# Patient Record
Sex: Female | Born: 1960 | Race: White | Hispanic: No | Marital: Married | Smoking: Never smoker
Health system: Southern US, Community
[De-identification: ages and names within clinical notes are randomized; demographics above are authoritative.]

## PROBLEM LIST (undated history)

## (undated) DIAGNOSIS — K219 Gastro-esophageal reflux disease without esophagitis: Secondary | ICD-10-CM

## (undated) DIAGNOSIS — Z9221 Personal history of antineoplastic chemotherapy: Secondary | ICD-10-CM

## (undated) DIAGNOSIS — I10 Essential (primary) hypertension: Secondary | ICD-10-CM

## (undated) DIAGNOSIS — I251 Atherosclerotic heart disease 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: Secondary | ICD-10-CM

## (undated) HISTORY — PX: FOOT SURGERY: SHX648

## (undated) HISTORY — DX: Essential (primary) hypertension: I10

---

## 2002-08-12 ENCOUNTER — Other Ambulatory Visit: Admission: RE | Admit: 2002-08-12 | Discharge: 2002-08-12 | Payer: Self-pay | Admitting: Obstetrics & Gynecology

## 2003-08-15 ENCOUNTER — Other Ambulatory Visit: Admission: RE | Admit: 2003-08-15 | Discharge: 2003-08-15 | Payer: Self-pay | Admitting: Obstetrics & Gynecology

## 2004-10-22 ENCOUNTER — Other Ambulatory Visit: Admission: RE | Admit: 2004-10-22 | Discharge: 2004-10-22 | Payer: Self-pay | Admitting: Obstetrics & Gynecology

## 2008-01-07 ENCOUNTER — Encounter: Admission: RE | Admit: 2008-01-07 | Discharge: 2008-01-07 | Payer: Self-pay | Admitting: Family Medicine

## 2010-02-24 ENCOUNTER — Encounter: Payer: Self-pay | Admitting: Internal Medicine

## 2011-06-10 ENCOUNTER — Ambulatory Visit: Payer: PRIVATE HEALTH INSURANCE | Attending: Orthopedic Surgery | Admitting: Physical Therapy

## 2011-06-10 DIAGNOSIS — M25676 Stiffness of unspecified foot, not elsewhere classified: Secondary | ICD-10-CM | POA: Insufficient documentation

## 2011-06-10 DIAGNOSIS — IMO0001 Reserved for inherently not codable concepts without codable children: Secondary | ICD-10-CM | POA: Insufficient documentation

## 2011-06-10 DIAGNOSIS — M25673 Stiffness of unspecified ankle, not elsewhere classified: Secondary | ICD-10-CM | POA: Insufficient documentation

## 2011-06-10 DIAGNOSIS — R262 Difficulty in walking, not elsewhere classified: Secondary | ICD-10-CM | POA: Insufficient documentation

## 2011-06-10 DIAGNOSIS — M25579 Pain in unspecified ankle and joints of unspecified foot: Secondary | ICD-10-CM | POA: Insufficient documentation

## 2011-06-13 ENCOUNTER — Ambulatory Visit: Payer: PRIVATE HEALTH INSURANCE | Admitting: Physical Therapy

## 2011-06-16 ENCOUNTER — Ambulatory Visit: Payer: PRIVATE HEALTH INSURANCE | Admitting: Physical Therapy

## 2011-06-18 ENCOUNTER — Encounter: Payer: Self-pay | Admitting: Physical Therapy

## 2011-06-23 ENCOUNTER — Encounter: Payer: Self-pay | Admitting: Physical Therapy

## 2011-06-25 ENCOUNTER — Encounter: Payer: Self-pay | Admitting: Physical Therapy

## 2011-07-01 ENCOUNTER — Encounter: Payer: Self-pay | Admitting: Physical Therapy

## 2011-07-03 ENCOUNTER — Encounter: Payer: Self-pay | Admitting: Physical Therapy

## 2016-06-27 ENCOUNTER — Other Ambulatory Visit: Payer: Self-pay | Admitting: Otolaryngology

## 2016-06-27 DIAGNOSIS — H9042 Sensorineural hearing loss, unilateral, left ear, with unrestricted hearing on the contralateral side: Secondary | ICD-10-CM

## 2016-07-08 ENCOUNTER — Ambulatory Visit (HOSPITAL_COMMUNITY): Payer: PRIVATE HEALTH INSURANCE | Attending: Cardiovascular Disease

## 2016-07-08 ENCOUNTER — Other Ambulatory Visit: Payer: Self-pay

## 2016-07-08 ENCOUNTER — Encounter (INDEPENDENT_AMBULATORY_CARE_PROVIDER_SITE_OTHER): Payer: Self-pay

## 2016-07-08 DIAGNOSIS — H9042 Sensorineural hearing loss, unilateral, left ear, with unrestricted hearing on the contralateral side: Secondary | ICD-10-CM | POA: Diagnosis not present

## 2016-07-08 LAB — ECHOCARDIOGRAM COMPLETE BUBBLE STUDY
AVLVOTPG: 5 mmHg
Ao-asc: 32 cm
CHL CUP TV REG PEAK VELOCITY: 255 cm/s
EERAT: 8.64
EWDT: 254 ms
FS: 43 % (ref 28–44)
IVS/LV PW RATIO, ED: 1.11
LA ID, A-P, ES: 36 mm
LA vol index: 29.8 mL/m2
LA vol: 52 mL
LADIAMINDEX: 2.07 cm/m2
LAVOLA4C: 52 mL
LEFT ATRIUM END SYS DIAM: 36 mm
LV E/e' medial: 8.64
LV SIMPSON'S DISK: 66
LV TDI E'LATERAL: 8.97
LV TDI E'MEDIAL: 6.24
LV dias vol index: 44 mL/m2
LV dias vol: 77 mL (ref 46–106)
LV e' LATERAL: 8.97 cm/s
LV sys vol index: 15 mL/m2
LVEEAVG: 8.64
LVOT SV: 72 mL
LVOT VTI: 28.5 cm
LVOT area: 2.54 cm2
LVOT diameter: 18 mm
LVOT peak vel: 115 cm/s
LVSYSVOL: 26 mL (ref 14–42)
MV Dec: 254
MV Peak grad: 2 mmHg
MVPKAVEL: 97.2 m/s
MVPKEVEL: 77.5 m/s
PW: 10 mm — AB (ref 0.6–1.1)
RV LATERAL S' VELOCITY: 15.7 cm/s
RV sys press: 29 mmHg
Stroke v: 51 ml
TRMAXVEL: 255 cm/s

## 2017-10-26 DIAGNOSIS — M25561 Pain in right knee: Secondary | ICD-10-CM | POA: Insufficient documentation

## 2017-10-29 ENCOUNTER — Other Ambulatory Visit: Payer: Self-pay | Admitting: Orthopedic Surgery

## 2017-10-29 DIAGNOSIS — M25561 Pain in right knee: Secondary | ICD-10-CM

## 2017-10-29 DIAGNOSIS — R609 Edema, unspecified: Secondary | ICD-10-CM

## 2017-10-30 ENCOUNTER — Ambulatory Visit
Admission: RE | Admit: 2017-10-30 | Discharge: 2017-10-30 | Disposition: A | Discharge status: Home / Self Care | Payer: PRIVATE HEALTH INSURANCE | Source: Ambulatory Visit | Attending: Orthopedic Surgery | Admitting: Orthopedic Surgery

## 2017-10-30 DIAGNOSIS — M25561 Pain in right knee: Secondary | ICD-10-CM

## 2017-10-30 DIAGNOSIS — R609 Edema, unspecified: Secondary | ICD-10-CM

## 2017-11-09 ENCOUNTER — Ambulatory Visit
Admission: RE | Admit: 2017-11-09 | Discharge: 2017-11-09 | Disposition: A | Discharge status: Home / Self Care | Payer: PRIVATE HEALTH INSURANCE | Source: Ambulatory Visit | Attending: Internal Medicine | Admitting: Internal Medicine

## 2017-11-09 ENCOUNTER — Other Ambulatory Visit: Payer: Self-pay | Admitting: Internal Medicine

## 2017-11-09 DIAGNOSIS — R059 Cough, unspecified: Secondary | ICD-10-CM

## 2017-11-09 DIAGNOSIS — R05 Cough: Secondary | ICD-10-CM

## 2017-12-03 ENCOUNTER — Other Ambulatory Visit: Payer: Self-pay | Admitting: Internal Medicine

## 2017-12-03 DIAGNOSIS — Z1239 Encounter for other screening for malignant neoplasm of breast: Secondary | ICD-10-CM

## 2019-01-26 DIAGNOSIS — R030 Elevated blood-pressure reading, without diagnosis of hypertension: Secondary | ICD-10-CM | POA: Insufficient documentation

## 2019-06-06 ENCOUNTER — Ambulatory Visit: Payer: PRIVATE HEALTH INSURANCE | Admitting: Internal Medicine

## 2019-06-07 ENCOUNTER — Other Ambulatory Visit: Payer: Self-pay

## 2019-06-07 ENCOUNTER — Ambulatory Visit (INDEPENDENT_AMBULATORY_CARE_PROVIDER_SITE_OTHER): Payer: PRIVATE HEALTH INSURANCE | Admitting: Pulmonary Disease

## 2019-06-07 ENCOUNTER — Encounter: Payer: Self-pay | Admitting: Pulmonary Disease

## 2019-06-07 DIAGNOSIS — R05 Cough: Secondary | ICD-10-CM

## 2019-06-07 DIAGNOSIS — R059 Cough, unspecified: Secondary | ICD-10-CM

## 2019-06-07 MED ORDER — OMEPRAZOLE 40 MG PO CPDR: 40.0000 mg | DELAYED_RELEASE_CAPSULE | Freq: Every day | ORAL | 5 refills | Status: DC

## 2019-06-07 MED ORDER — CHLORPHENIRAMINE MALEATE 4 MG PO TABS: 8.0000 mg | ORAL_TABLET | Freq: Three times a day (TID) | ORAL | 1 refills | Status: DC | PRN

## 2019-06-07 MED ORDER — FLUTICASONE PROPIONATE 50 MCG/ACT NA SUSP: 2.0000 | Freq: Every day | NASAL | 2 refills | Status: DC

## 2019-06-07 NOTE — Patient Instructions (Addendum)
We will check labs today including CBC differential, IgE Schedule high-resolution CT, pulmonary function test Referred to ENT for persistent cough, throat discomfort  For postnasal drip will try Flonase, chlorpheniramine 8 mg 3 times daily For acid reflux start omeprazole 40 mg twice daily for 2 weeks next Follow-up in 1 to 2 months.

## 2019-06-07 NOTE — Progress Notes (Signed)
Christine [name]    [id number]    04-28-1960  Primary Care Physician:Gates, Herbie [place], MD  Referring Physician: [name], MD [address] [place],  [place] [address]  Chief complaint: Consult for chronic cough  HPI: 59 year old with hypertension. Complains of chronic cough for the past 1.5 years.  Symptoms associated with throat tightening, discomfort.  Denies any wheezing, chest tightness, chest congestion. She has been treated comprehensively by her primary care with Allegra, Claritin, Zyrtec.  She also used Prilosec over-the-counter for acid reflux and a course of prednisone in spring 2020 without improvement in symptoms She has followed up with allergy with negative skin testing.  Taken off losartan for 1 month and given gabapentin for 2 months all without any significant effect on cough.   Pets: Has outdoor cats, chickens outside.  She cleans the group occasionally and wears a mask while doing so. Occupation: Works at her Economist for [employer] Exposures: No known exposures.  No mold, hot tub, Jacuzzi.  No down pillows or comforters Smoking history: Never smoker Travel history: Originally from [place].  No significant recent travel Relevant family history: No significant family history of lung disease   Outpatient Encounter Medications as of 06/07/2019  Medication Sig  . Bacillus Coagulans-Inulin (PROBIOTIC) 1-250 BILLION-MG CAPS Take by mouth daily.   . Magnesium 100 MG CAPS Take by mouth daily.   . valsartan-hydrochlorothiazide (DIOVAN-HCT) 80-12.5 MG tablet Take 0.5 tablets by mouth daily.   No facility-administered encounter medications on file as of 06/07/2019.    Allergies as of 06/07/2019  . (No Known Allergies)    Past Medical History:  Diagnosis Date  . Hypertension     Past Surgical History:  Procedure Laterality Date  . CESAREAN SECTION    . FOOT SURGERY Right     Family History  Problem Relation Age of Onset   . Heart disease Father   . Breast cancer Sister     Social History   Socioeconomic History  . Marital status: Married    Spouse name: Not on file  . Number of children: Not on file  . Years of education: Not on file  . Highest education level: Not on file  Occupational History  . Not on file  Tobacco Use  . Smoking status: Never Smoker  . Smokeless tobacco: Never Used  Substance and Sexual Activity  . Alcohol use: Yes    Comment: once weekly  . Drug use: Never  . Sexual activity: Not on file  Other Topics Concern  . Not on file  Social History Narrative  . Not on file   Social Determinants of Health   Financial Resource Strain:   . Difficulty of Paying Living Expenses:   Food Insecurity:   . Worried About Charity fundraiser in the Last Year:   . Arboriculturist in the Last Year:   Transportation Needs:   . Film/video editor (Medical):   Marland Kitchen Lack of Transportation (Non-Medical):   Physical Activity:   . Days of Exercise per Week:   . Minutes of Exercise per Session:   Stress:   . Feeling of Stress :   Social Connections:   . Frequency of Communication with Friends and Family:   . Frequency of Social Gatherings with Friends and Family:   . Attends Religious Services:   . Active Member of Clubs or Organizations:   . Attends Archivist Meetings:   .  Marital Status:   Intimate Partner Violence:   . Fear of Current or Ex-Partner:   . Emotionally Abused:   Marland Kitchen Physically Abused:   . Sexually Abused:     Review of systems: Review of Systems  Constitutional: Negative for fever and chills.  HENT: Negative.   Eyes: Negative for blurred vision.  Respiratory: as per HPI  Cardiovascular: Negative for chest pain and palpitations.  Gastrointestinal: Negative for vomiting, diarrhea, blood per rectum. Genitourinary: Negative for dysuria, urgency, frequency and hematuria.  Musculoskeletal: Negative for myalgias, back pain and joint pain.  Skin: Negative for  itching and rash.  Neurological: Negative for dizziness, tremors, focal weakness, seizures and loss of consciousness.  Endo/Heme/Allergies: Negative for environmental allergies.  Psychiatric/Behavioral: Negative for depression, suicidal ideas and hallucinations.  All other systems reviewed and are negative.  Physical Exam: Blood pressure 114/62, pulse 78, temperature 98.1 F (36.7 C), temperature source Temporal, height 5\' 4"  (1.626 m), weight 145 lb (65.8 kg), SpO2 97 %. Gen:      No acute distress HEENT:  EOMI, sclera anicteric Neck:     No masses; no thyromegaly Lungs:    Clear to auscultation bilaterally; normal respiratory effort CV:         Regular rate and rhythm; no murmurs Abd:      + bowel sounds; soft, non-tender; no palpable masses, no distension Ext:    No edema; adequate peripheral perfusion Skin:      Warm and dry; no rash Neuro: alert and oriented x 3 Psych: normal mood and affect  Data Reviewed: Imaging: Chest x-ray 11/09/2017-no acute abnormalities.  I have reviewed the images personally.   Assessment:  Chronic cough May be upper airway cough as she has associated symptoms of throat discomfort and tightening.  She has not responded to over-the-counter allergy medications, PPI, steroids, gabapentin in the past.  Since symptoms are persistent will evaluate lungs with CT and pulmonary function test Check CBC differential, IgE for baseline  Discussed referral to ENT as her symptoms are confined to the throat.  May have vocal cord dysfunction. In the meantime I will treat her postnasal drip and acid reflux aggressively for 2-4 weeks to see if there is any improvement Start Flonase, first generation antihistamine chlorpheniramine 8 mg 3 times daily Omeprazole 40 mg twice daily.  Plan/Recommendations: High-res CT, PFTs CBC, IgE ENT referral Flonase, chlorphentermine, omeprazole  Marshell Garfinkel MD [facility] 06/07/2019, 9:18 AM  CC:  [name], MD

## 2019-06-13 ENCOUNTER — Telehealth: Payer: Self-pay | Admitting: Pulmonary Disease

## 2019-06-13 NOTE — Telephone Encounter (Signed)
Spoke with pt and advised that it is ok to refill chlorpheniramine if she feels it is helping. Pt verbalized understanding. Nothing further needed.

## 2019-06-16 ENCOUNTER — Other Ambulatory Visit (INDEPENDENT_AMBULATORY_CARE_PROVIDER_SITE_OTHER): Payer: PRIVATE HEALTH INSURANCE

## 2019-06-16 DIAGNOSIS — R05 Cough: Secondary | ICD-10-CM

## 2019-06-16 DIAGNOSIS — R059 Cough, unspecified: Secondary | ICD-10-CM

## 2019-06-16 LAB — CBC WITH DIFFERENTIAL/PLATELET
Basophils Absolute: 0.1 10*3/uL (ref 0.0–0.1)
Basophils Relative: 0.7 % (ref 0.0–3.0)
Eosinophils Absolute: 0.2 10*3/uL (ref 0.0–0.7)
Eosinophils Relative: 2.7 % (ref 0.0–5.0)
HCT: 35.8 % — ABNORMAL LOW (ref 36.0–46.0)
Hemoglobin: 12.4 g/dL (ref 12.0–15.0)
Lymphocytes Relative: 28.8 % (ref 12.0–46.0)
Lymphs Abs: 2 10*3/uL (ref 0.7–4.0)
MCHC: 34.6 g/dL (ref 30.0–36.0)
MCV: 87.9 fl (ref 78.0–100.0)
Monocytes Absolute: 0.5 10*3/uL (ref 0.1–1.0)
Monocytes Relative: 6.6 % (ref 3.0–12.0)
Neutro Abs: 4.2 10*3/uL (ref 1.4–7.7)
Neutrophils Relative %: 61.2 % (ref 43.0–77.0)
Platelets: 269 10*3/uL (ref 150.0–400.0)
RBC: 4.08 Mil/uL (ref 3.87–5.11)
RDW: 13.2 % (ref 11.5–15.5)
WBC: 6.8 10*3/uL (ref 4.0–10.5)

## 2019-06-17 ENCOUNTER — Inpatient Hospital Stay: Admission: RE | Admit: 2019-06-17 | Payer: PRIVATE HEALTH INSURANCE | Source: Ambulatory Visit

## 2019-06-17 LAB — IGE: IgE (Immunoglobulin E), Serum: 22 kU/L (ref <=114)

## 2019-06-21 ENCOUNTER — Other Ambulatory Visit: Payer: Self-pay

## 2019-06-21 ENCOUNTER — Ambulatory Visit (INDEPENDENT_AMBULATORY_CARE_PROVIDER_SITE_OTHER)
Admission: RE | Admit: 2019-06-21 | Discharge: 2019-06-21 | Disposition: A | Discharge status: Home / Self Care | Payer: PRIVATE HEALTH INSURANCE | Source: Ambulatory Visit | Attending: Pulmonary Disease | Admitting: Pulmonary Disease

## 2019-06-21 DIAGNOSIS — R05 Cough: Secondary | ICD-10-CM | POA: Diagnosis not present

## 2019-06-21 DIAGNOSIS — R059 Cough, unspecified: Secondary | ICD-10-CM

## 2019-06-22 ENCOUNTER — Telehealth: Payer: Self-pay | Admitting: Pulmonary Disease

## 2019-06-22 NOTE — Telephone Encounter (Signed)
Marshell Garfinkel, MD  06/22/2019 2:58 PM EDT    Please let patient know that CT scan does not show any evidence of lung disease. There is evidence of plaque buildup in the blood vessels of the heart and aorta. Please follow-up with primary care regarding this.   Called and spoke with pt letting her know the results of the CT and she verbalized understanding. Nothing further needed.

## 2019-07-06 ENCOUNTER — Institutional Professional Consult (permissible substitution): Payer: PRIVATE HEALTH INSURANCE | Admitting: Internal Medicine

## 2019-07-23 ENCOUNTER — Other Ambulatory Visit (HOSPITAL_COMMUNITY)
Admission: RE | Admit: 2019-07-23 | Discharge: 2019-07-23 | Disposition: A | Discharge status: Home / Self Care | Payer: PRIVATE HEALTH INSURANCE | Source: Ambulatory Visit | Attending: Pulmonary Disease | Admitting: Pulmonary Disease

## 2019-07-23 DIAGNOSIS — Z20822 Contact with and (suspected) exposure to covid-19: Secondary | ICD-10-CM | POA: Insufficient documentation

## 2019-07-23 DIAGNOSIS — Z01812 Encounter for preprocedural laboratory examination: Secondary | ICD-10-CM | POA: Insufficient documentation

## 2019-07-23 LAB — SARS CORONAVIRUS 2 (TAT 6-24 HRS): SARS Coronavirus 2: NEGATIVE

## 2019-07-27 ENCOUNTER — Other Ambulatory Visit: Payer: Self-pay

## 2019-07-27 ENCOUNTER — Ambulatory Visit (INDEPENDENT_AMBULATORY_CARE_PROVIDER_SITE_OTHER): Payer: PRIVATE HEALTH INSURANCE | Admitting: Pulmonary Disease

## 2019-07-27 DIAGNOSIS — R05 Cough: Secondary | ICD-10-CM | POA: Diagnosis not present

## 2019-07-27 DIAGNOSIS — R059 Cough, unspecified: Secondary | ICD-10-CM

## 2019-07-27 LAB — PULMONARY FUNCTION TEST
DL/VA % pred: 99 %
DL/VA: 4.21 ml/min/mmHg/L
DLCO cor % pred: 95 %
DLCO cor: 19.49 ml/min/mmHg
DLCO unc % pred: 92 %
DLCO unc: 18.87 ml/min/mmHg
FEF 25-75 Post: 3.24 L/sec
FEF 25-75 Pre: 3.77 L/sec
FEF2575-%Change-Post: -14 %
FEF2575-%Pred-Post: 132 %
FEF2575-%Pred-Pre: 154 %
FEV1-%Change-Post: -4 %
FEV1-%Pred-Post: 97 %
FEV1-%Pred-Pre: 102 %
FEV1-Post: 2.55 L
FEV1-Pre: 2.67 L
FEV1FVC-%Change-Post: -7 %
FEV1FVC-%Pred-Pre: 110 %
FEV6-%Change-Post: 2 %
FEV6-%Pred-Post: 97 %
FEV6-%Pred-Pre: 94 %
FEV6-Post: 3.17 L
FEV6-Pre: 3.08 L
FEV6FVC-%Pred-Post: 103 %
FEV6FVC-%Pred-Pre: 103 %
FVC-%Change-Post: 2 %
FVC-%Pred-Post: 94 %
FVC-%Pred-Pre: 91 %
FVC-Post: 3.17 L
FVC-Pre: 3.08 L
Post FEV1/FVC ratio: 80 %
Post FEV6/FVC ratio: 100 %
Pre FEV1/FVC ratio: 87 %
Pre FEV6/FVC Ratio: 100 %
RV % pred: 90 %
RV: 1.77 L
TLC % pred: 98 %
TLC: 4.98 L

## 2019-07-27 NOTE — Progress Notes (Signed)
Full PFT performed today. °

## 2019-08-02 DIAGNOSIS — J310 Chronic rhinitis: Secondary | ICD-10-CM | POA: Insufficient documentation

## 2019-08-02 DIAGNOSIS — R0989 Other specified symptoms and signs involving the circulatory and respiratory systems: Secondary | ICD-10-CM | POA: Insufficient documentation

## 2019-08-29 ENCOUNTER — Ambulatory Visit: Payer: PRIVATE HEALTH INSURANCE | Admitting: Pulmonary Disease

## 2019-08-31 ENCOUNTER — Ambulatory Visit (INDEPENDENT_AMBULATORY_CARE_PROVIDER_SITE_OTHER): Payer: PRIVATE HEALTH INSURANCE | Admitting: Primary Care

## 2019-08-31 ENCOUNTER — Other Ambulatory Visit: Payer: Self-pay

## 2019-08-31 ENCOUNTER — Encounter: Payer: Self-pay | Admitting: Primary Care

## 2019-08-31 DIAGNOSIS — K219 Gastro-esophageal reflux disease without esophagitis: Secondary | ICD-10-CM | POA: Diagnosis not present

## 2019-08-31 DIAGNOSIS — R053 Chronic cough: Secondary | ICD-10-CM | POA: Insufficient documentation

## 2019-08-31 DIAGNOSIS — R05 Cough: Secondary | ICD-10-CM | POA: Diagnosis not present

## 2019-08-31 MED ORDER — MONTELUKAST SODIUM 10 MG PO TABS: 10.0000 mg | ORAL_TABLET | Freq: Every day | ORAL | 11 refills | Status: DC

## 2019-08-31 NOTE — Progress Notes (Signed)
@Patient  ID: Christine [name], female    DOB: 01-Feb-1961, 59 y.o.   MRN: [id number]  Chief Complaint  Patient presents with  . Follow-up    cough    Referring provider: [name], MD  HPI: 59 year old female, never smoked.  Past medical history significant for cough.  Patient of Dr. Vaughan Browner, seen for initial consult on 06/07/2019.  She was given trial of chlorphentermine to help with her cough.  HRCT chest showed no evidence of fibrotic interstitial lung disease, coronary artery disease/arthrosclerosis.  IgE 22, eosinophil absolute 200.  08/31/2019 Patient presents today for 52-month follow-up office visit for cough. Pulmonary function testing in June 2021 were normal. She saw ENT and had evidence of GERD. Reports that her cough has dramatically improved since starting Omeprazole. She also noticed some improvement when she took chlorphentermine as needed for cough. She has an outdoor Chiropractor. Eosinophil absolute 200.    Pulmonary function testing: 07/27/2019 PFTs-FVC 3.17 (94%), FEV1 2.55 (97%), ratio 80, DLCO correlated 19.49 (95%) No evidence of restrictive or obstructive lung disease.  No bronchodilator response.  Normal diffusion capacity.  No Known Allergies  Immunization History  Administered Date(s) Administered  . PFIZER SARS-COV-2 Vaccination 02/28/2019, 03/28/2019    Past Medical History:  Diagnosis Date  . Hypertension     Tobacco History: Social History   Tobacco Use  Smoking Status Never Smoker  Smokeless Tobacco Never Used   Counseling given: Not Answered   Outpatient Medications Prior to Visit  Medication Sig Dispense Refill  . Bacillus Coagulans-Inulin (PROBIOTIC) 1-250 BILLION-MG CAPS Take by mouth daily.     . chlorpheniramine (CHLOR-TRIMETON) 4 MG tablet Take 2 tablets (8 mg total) by mouth 3 (three) times daily as needed for allergies. 20 tablet 1  . fluticasone (FLONASE) 50 MCG/ACT nasal spray Place 2 sprays into both nostrils daily. 16 g  2  . Magnesium 100 MG CAPS Take by mouth daily.     Marland Kitchen omeprazole (PRILOSEC) 40 MG capsule Take 1 capsule (40 mg total) by mouth daily. 30 capsule 5  . valsartan-hydrochlorothiazide (DIOVAN-HCT) 80-12.5 MG tablet Take 0.5 tablets by mouth daily.     No facility-administered medications prior to visit.   Review of Systems  Review of Systems  Constitutional: Negative.   Respiratory: Negative for cough, chest tightness and shortness of breath.        Cough- improved   Physical Exam  BP (!) 120/62 (BP Location: Left Arm, Cuff Size: Normal)   Pulse 67   Temp (!) 97.3 F (36.3 C) (Oral)   Ht 5\' 4"  (1.626 m)   Wt 142 lb 3.2 oz (64.5 kg)   SpO2 100%   BMI 24.41 kg/m  Physical Exam Constitutional:      Appearance: Normal appearance.  HENT:     Head: Normocephalic and atraumatic.     Mouth/Throat:     Mouth: Mucous membranes are moist.     Pharynx: Oropharynx is clear.  Cardiovascular:     Rate and Rhythm: Normal rate and regular rhythm.  Pulmonary:     Effort: Pulmonary effort is normal.     Breath sounds: No wheezing or rhonchi.  Neurological:     General: No focal deficit present.     Mental Status: She is alert and oriented to person, place, and time. Mental status is at baseline.  Psychiatric:        Mood and Affect: Mood normal.        Behavior: Behavior normal.  Thought Content: Thought content normal.        Judgment: Judgment normal.      Lab Results:  CBC    Component Value Date/Time   WBC 6.8 06/16/2019 1456   RBC 4.08 06/16/2019 1456   HGB 12.4 06/16/2019 1456   HCT 35.8 (L) 06/16/2019 1456   PLT 269.0 06/16/2019 1456   MCV 87.9 06/16/2019 1456   MCHC 34.6 06/16/2019 1456   RDW 13.2 06/16/2019 1456   LYMPHSABS 2.0 06/16/2019 1456   MONOABS 0.5 06/16/2019 1456   EOSABS 0.2 06/16/2019 1456   BASOSABS 0.1 06/16/2019 1456    BMET No results found for: NA, K, CL, CO2, GLUCOSE, BUN, CREATININE, CALCIUM, GFRNONAA, GFRAA  BNP No results found  for: BNP  ProBNP No results found for: PROBNP  Imaging: No results found.   Assessment & Plan:   Chronic cough - Significantly improved with PPI - Pulmonary functinon testing was normal. HRCT showed no evidence of ILD. Eosinophils 200  - Recommend trial Montelukast (Singulair) 10mg  at bedtime  - Continue Flonase nasal daily and prn chlorpeharine 1 tablet every 4-6 hours for breakthrough cough as needed    GERD (gastroesophageal reflux disease) - Follow GERD diet  - Continue Omeprazole 40mg  1 hour before dinner     Martyn Ehrich, NP 08/31/2019

## 2019-08-31 NOTE — Patient Instructions (Addendum)
Cough recommendation: - Trial Montelukast (Singulair) 10mg  at bedtime  - Continue Omeprazole 40mg  1 hour before dinner  - Continue Flonase nasal daily  - You can take chlorpeharine 1 tablet every 4-6 hours for breakthrough cough as needed  - Follow GERD diet   *Once you eliminate cough all together for 3-5 days in a row you can trial off Flonase and chlorphentermine - one medication at a time and if cough return resume that medication *Avoid mint and menthol products as these can trigger a cough   Follow-up: As needed basis with Dr. Vaughan Browner or nurse practitioner      Montelukast oral tablets What is this medicine? MONTELUKAST (mon te LOO kast) is used to prevent and treat the symptoms of asthma. It is also used to treat allergies. Do not use for an acute asthma attack. This medicine may be used for other purposes; ask your health care provider or pharmacist if you have questions. COMMON BRAND NAME(S): Singulair What should I tell my health care provider before I take this medicine? They need to know if you have any of these conditions:  liver disease  an unusual or allergic reaction to montelukast, other medicines, foods, dyes, or preservatives  pregnant or trying to get pregnant  breast-feeding How should I use this medicine? This medicine should be given by mouth. Follow the directions on the prescription label. Take this medicine at the same time every day. You may take this medicine with or without meals. Do not chew the tablets. Do not stop taking your medicine unless your doctor tells you to. Talk to your pediatrician regarding the use of this medicine in children. Special care may be needed. While this drug may be prescribed for children as young as 19 years of age for selected conditions, precautions do apply. Overdosage: If you think you have taken too much of this medicine contact a poison control center or emergency room at once. NOTE: This medicine is only for you. Do not  share this medicine with others. What if I miss a dose? If you miss a dose, skip it. Take your next dose at the normal time. Do not take extra or 2 doses at the same time to make up for the missed dose. What may interact with this medicine?  anti-infectives like rifampin and rifabutin  medicines for seizures like phenytoin, phenobarbital, and carbamazepine This list may not describe all possible interactions. Give your health care provider a list of all the medicines, herbs, non-prescription drugs, or dietary supplements you use. Also tell them if you smoke, drink alcohol, or use illegal drugs. Some items may interact with your medicine. What should I watch for while using this medicine? Visit your doctor or health care professional for regular checks on your progress. Tell your doctor or health care professional if your allergy or asthma symptoms do not improve. Take your medicine even when you do not have symptoms. Do not stop taking any of your medicine(s) unless your doctor tells you to. If you have asthma, talk to your doctor about what to do in an acute asthma attack. Always have your inhaled rescue medicine for asthma attacks with you. Patients and their families should watch for new or worsening thoughts of suicide or depression. Also watch for sudden changes in feelings such as feeling anxious, agitated, panicky, irritable, hostile, aggressive, impulsive, severely restless, overly excited and hyperactive, or not being able to sleep. Any worsening of mood or thoughts of suicide or dying should be reported  to your health care professional right away. What side effects may I notice from receiving this medicine? Side effects that you should report to your doctor or health care professional as soon as possible:  allergic reactions like skin rash or hives, or swelling of the face, lips, or tongue  breathing problems  changes in emotions or moods  confusion  depressed mood  fever or  infection  hallucinations  joint pain  painful lumps under the skin  pain, tingling, numbness in the hands or feet  redness, blistering, peeling, or loosening of the skin, including inside the mouth  restlessness  seizures  sleep walking  signs and symptoms of infection like fever; chills; cough; sore throat; flu-like illness  signs and symptoms of liver injury like dark yellow or brown urine; general ill feeling or flu-like symptoms; light-colored stools; loss of appetite; nausea; right upper belly pain; unusually weak or tired; yellowing of the eyes or skin  sinus pain or swelling  stuttering  suicidal thoughts or other mood changes  tremors  trouble sleeping  uncontrolled muscle movements  unusual bleeding or bruising  vivid or bad dreams Side effects that usually do not require medical attention (report to your doctor or health care professional if they continue or are bothersome):  dizziness  drowsiness  headache  runny nose  stomach upset  tiredness This list may not describe all possible side effects. Call your doctor for medical advice about side effects. You may report side effects to FDA at 1-800-FDA-1088. Where should I keep my medicine? Keep out of the reach of children. Store at room temperature between 15 and 30 degrees C (59 and 86 degrees F). Protect from light and moisture. Keep this medicine in the original bottle. Throw away any unused medicine after the expiration date. NOTE: This sheet is a summary. It may not cover all possible information. If you have questions about this medicine, talk to your doctor, pharmacist, or health care provider.  2020 Elsevier/Gold Standard (2018-05-21 12:54:33)     Food Choices for Gastroesophageal Reflux Disease, Adult When you have gastroesophageal reflux disease (GERD), the foods you eat and your eating habits are very important. Choosing the right foods can help ease your discomfort. Think about  working with a nutrition specialist (dietitian) to help you make good choices. What are tips for following this plan?  Meals  Choose healthy foods that are low in fat, such as fruits, vegetables, whole grains, low-fat dairy products, and lean meat, fish, and poultry.  Eat small meals often instead of 3 large meals a day. Eat your meals slowly, and in a place where you are relaxed. Avoid bending over or lying down until 2-3 hours after eating.  Avoid eating meals 2-3 hours before bed.  Avoid drinking a lot of liquid with meals.  Cook foods using methods other than frying. Bake, grill, or broil food instead.  Avoid or limit: ? Chocolate. ? Peppermint or spearmint. ? Alcohol. ? Pepper. ? Black and decaffeinated coffee. ? Black and decaffeinated tea. ? Bubbly (carbonated) soft drinks. ? Caffeinated energy drinks and soft drinks.  Limit high-fat foods such as: ? Fatty meat or fried foods. ? Whole milk, cream, butter, or ice cream. ? Nuts and nut butters. ? Pastries, donuts, and sweets made with butter or shortening.  Avoid foods that cause symptoms. These foods may be different for everyone. Common foods that cause symptoms include: ? Tomatoes. ? Oranges, lemons, and limes. ? Peppers. ? Spicy food. ? Onions and  garlic. ? Vinegar. Lifestyle  Maintain a healthy weight. Ask your doctor what weight is healthy for you. If you need to lose weight, work with your doctor to do so safely.  Exercise for at least 30 minutes for 5 or more days each week, or as told by your doctor.  Wear loose-fitting clothes.  Do not smoke. If you need help quitting, ask your doctor.  Sleep with the head of your bed higher than your feet. Use a wedge under the mattress or blocks under the bed frame to raise the head of the bed. Summary  When you have gastroesophageal reflux disease (GERD), food and lifestyle choices are very important in easing your symptoms.  Eat small meals often instead of 3  large meals a day. Eat your meals slowly, and in a place where you are relaxed.  Limit high-fat foods such as fatty meat or fried foods.  Avoid bending over or lying down until 2-3 hours after eating.  Avoid peppermint and spearmint, caffeine, alcohol, and chocolate. This information is not intended to replace advice given to you by your health care provider. Make sure you discuss any questions you have with your health care provider. Document Revised: 05/13/2018 Document Reviewed: 02/26/2016 Elsevier Patient Education  [place].

## 2019-08-31 NOTE — Assessment & Plan Note (Signed)
-   Follow GERD diet  - Continue Omeprazole 40mg  1 hour before dinner

## 2019-08-31 NOTE — Assessment & Plan Note (Addendum)
-   Significantly improved with PPI - Pulmonary functinon testing was normal. HRCT showed no evidence of ILD. Eosinophils 200  - Recommend trial Montelukast (Singulair) 10mg  at bedtime  - Continue Flonase nasal daily and prn chlorpeharine 1 tablet every 4-6 hours for breakthrough cough as needed

## 2019-09-03 ENCOUNTER — Other Ambulatory Visit: Payer: Self-pay | Admitting: Pulmonary Disease

## 2019-09-03 DIAGNOSIS — R059 Cough, unspecified: Secondary | ICD-10-CM

## 2019-09-03 DIAGNOSIS — R05 Cough: Secondary | ICD-10-CM

## 2019-09-14 ENCOUNTER — Other Ambulatory Visit: Payer: Self-pay | Admitting: Internal Medicine

## 2019-09-14 DIAGNOSIS — I2584 Coronary atherosclerosis due to calcified coronary lesion: Secondary | ICD-10-CM

## 2019-09-14 DIAGNOSIS — I251 Atherosclerotic heart disease 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: Secondary | ICD-10-CM

## 2019-09-26 ENCOUNTER — Ambulatory Visit
Admission: RE | Admit: 2019-09-26 | Discharge: 2019-09-26 | Disposition: A | Discharge status: Home / Self Care | Payer: No Typology Code available for payment source | Source: Ambulatory Visit | Attending: Internal Medicine | Admitting: Internal Medicine

## 2019-09-26 DIAGNOSIS — I2584 Coronary atherosclerosis due to calcified coronary lesion: Secondary | ICD-10-CM

## 2019-09-26 DIAGNOSIS — I251 Atherosclerotic heart disease 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: Secondary | ICD-10-CM

## 2019-09-27 ENCOUNTER — Encounter: Payer: Self-pay | Admitting: Internal Medicine

## 2019-09-27 NOTE — Progress Notes (Unsigned)
Asked by patient to review her Ca Score

## 2019-10-05 ENCOUNTER — Telehealth: Payer: Self-pay

## 2019-10-05 NOTE — Telephone Encounter (Signed)
    Pt returned call from [facility], r/s her NP appt tomorrow with Dr. Harrell Gave. Also advised pt to reach out with pcp to send records to fax [phone]. Pt agreed

## 2019-10-05 NOTE — Telephone Encounter (Signed)
Called pt to schedule a new patient appointment with Dr. Harrell Gave. Left message to call back.

## 2019-10-06 ENCOUNTER — Ambulatory Visit (INDEPENDENT_AMBULATORY_CARE_PROVIDER_SITE_OTHER): Payer: PRIVATE HEALTH INSURANCE | Admitting: Cardiology

## 2019-10-06 ENCOUNTER — Encounter: Payer: Self-pay | Admitting: Cardiology

## 2019-10-06 ENCOUNTER — Other Ambulatory Visit: Payer: Self-pay

## 2019-10-06 VITALS — BP 142/84 | HR 60 | Ht 64.0 in | Wt 140.2 lb

## 2019-10-06 DIAGNOSIS — Z7189 Other specified counseling: Secondary | ICD-10-CM

## 2019-10-06 DIAGNOSIS — Z79899 Other long term (current) drug therapy: Secondary | ICD-10-CM

## 2019-10-06 DIAGNOSIS — I10 Essential (primary) hypertension: Secondary | ICD-10-CM

## 2019-10-06 DIAGNOSIS — Z712 Person consulting for explanation of examination or test findings: Secondary | ICD-10-CM

## 2019-10-06 DIAGNOSIS — E7800 Pure hypercholesterolemia, unspecified: Secondary | ICD-10-CM

## 2019-10-06 DIAGNOSIS — I251 Atherosclerotic heart disease 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: Secondary | ICD-10-CM | POA: Diagnosis not present

## 2019-10-06 DIAGNOSIS — I2584 Coronary atherosclerosis due to calcified coronary lesion: Secondary | ICD-10-CM

## 2019-10-06 MED ORDER — ROSUVASTATIN CALCIUM 20 MG PO TABS: 20.0000 mg | ORAL_TABLET | Freq: Every day | ORAL | 3 refills | Status: DC

## 2019-10-06 NOTE — Patient Instructions (Addendum)
Medication Instructions:  Start Crestor 20 mg daily  *If you need a refill on your cardiac medications before your next appointment, please call your pharmacy*   Lab Work: Your physician recommends that you return for lab work in 2 months ( fasting lipids, LFT).  If you have labs (blood work) drawn today and your tests are completely normal, you will receive your results only by: Marland Kitchen MyChart Message (if you have MyChart) OR . A paper copy in the mail If you have any lab test that is abnormal or we need to change your treatment, we will call you to review the results.   Testing/Procedures: None   Follow-Up: At [facility], you and your health needs are our priority.  As part of our continuing mission to provide you with exceptional heart care, we have created designated Provider Care Teams.  These Care Teams include your primary Cardiologist (physician) and Advanced Practice Providers (APPs -  Physician Assistants and Nurse Practitioners) who all work together to provide you with the care you need, when you need it.  We recommend signing up for the patient portal called "MyChart".  Sign up information is provided on this After Visit Summary.  MyChart is used to connect with patients for Virtual Visits (Telemedicine).  Patients are able to view lab/test results, encounter notes, upcoming appointments, etc.  Non-urgent messages can be sent to your provider as well.   To learn more about what you can do with MyChart, go to [email or web address].    Your next appointment:   1 year(s)  The format for your next appointment:   In Person  Provider:   Buford Dresser, MD     Acute Coronary Syndrome Acute coronary syndrome (ACS) is a serious problem in which there is suddenly not enough blood and oxygen reaching the heart. ACS can result in chest pain or a heart attack. This condition is a medical emergency. If you have any symptoms of this condition, get help right away. What  are the causes? This condition may be caused by:  A buildup of fat and cholesterol inside the arteries (atherosclerosis). This is the most common cause. The buildup (plaque) can cause blood vessels in the heart (coronary arteries) to become narrow or blocked, which reduces blood flow to the heart. Plaque can also break off and lead to a clot, which can block an artery and cause a heart attack or stroke.  Sudden tightening of the muscles around the coronary arteries (coronary spasm).  Tearing of a coronary artery (spontaneous coronary artery dissection).  Very low blood pressure (hypotension).  An abnormal heartbeat (arrhythmia).  Other medical conditions that cause a decrease of oxygen to the heart, such as anemiaorrespiratory failure.  Using cocaine or methamphetamine. What increases the risk? The following factors may make you more likely to develop this condition:  Age. The risk for ACS increases as you get older.  History of chest pain, heart attack, peripheral artery disease, or stroke.  Having taken chemotherapy or immune-suppressing medicines.  Being female.  Family history of chest pain, heart disease, or stroke.  Smoking.  Not exercising enough.  Being overweight.  High cholesterol.  High blood pressure (hypertension).  Diabetes.  Excessive alcohol use. What are the signs or symptoms? Common symptoms of this condition include:  Chest pain. The pain may last a long time, or it may stop and come back (recur). It may feel like: ? Crushing or squeezing. ? Tightness, pressure, fullness, or heaviness.  Arm, neck, jaw, or  back pain.  Heartburn or indigestion.  Shortness of breath.  Nausea.  Sudden cold sweats.  Light-headedness.  Dizziness or passing out.  Tiredness (fatigue). Sometimes there are no symptoms. How is this diagnosed? This condition may be diagnosed based on:  Your medical history and symptoms.  Imaging tests, such as: ? An  electrocardiogram (ECG). This measures the heart's electrical activity. ? X-rays. ? CT scan. ? A coronary angiogram. For this test, dye is injected into the heart arteries and then X-rays are taken. ? Myocardial perfusion imaging. This test shows how well blood flows through your heart muscle.  Blood tests. These may be repeated at certain time intervals.  Exercise stress testing.  Echocardiogram. This is a test that uses sound waves to produce detailed images of the heart. How is this treated? Treatment for this condition may include:  Oxygen therapy.  Medicines, such as: ? Antiplatelet medicines and blood-thinning medicines, such as aspirin. These help prevent blood clots. ? Medicine that dissolves any blood clots (fibrinolytic therapy). ? Blood pressure medicines. ? Nitroglycerin. This helps widen blood vessels to improve blood flow. ? Pain medicine. ? Cholesterol-lowering medicine.  Surgery, such as: ? Coronary angioplasty with stent placement. This involves placing a small piece of metal that looks like mesh or a spring into a narrow coronary artery. This widens the artery and keeps it open. ? Coronary artery bypass surgery. This involves taking a section of a blood vessel from a different part of your body and placing it on the blocked coronary artery to allow blood to flow around the blockage.  Cardiac rehabilitation. This is a program that includes exercise training, education, and counseling to help you recover. Follow these instructions at home: Eating and drinking  Eat a heart-healthy diet that includes whole grains, fruits and vegetables, lean proteins, and low-fat or nonfat dairy products.  Limit how much salt (sodium) you eat as told by your health care provider. Follow instructions from your health care provider about any other eating or drinking restrictions, such as limiting foods that are high in fat and processed sugars.  Use healthy cooking methods such as  roasting, grilling, broiling, baking, poaching, steaming, or stir-frying.  Work with a dietitian to follow a heart-healthy eating plan. Medicines  Take over-the-counter and prescription medicines only as told by your health care provider.  Do not take these medicines unless your health care provider approves: ? Vitamin supplements that contain vitamin A or vitamin E. ? NSAIDs, such as ibuprofen, naproxen, or celecoxib. ? Hormone replacement therapy that contains estrogen.  If you are taking blood thinners: ? Talk with your health care provider before you take any medicines that contain aspirin or NSAIDs. These medicines increase your risk for dangerous bleeding. ? Take your medicine exactly as told, at the same time every day. ? Avoid activities that could cause injury or bruising, and follow instructions about how to prevent falls. ? Wear a medical alert bracelet, and carry a card that lists what medicines you take. Activity  Follow your cardiac rehabilitation program. Do exercises as told by your physical therapist.  Ask your health care provider what activities and exercises are safe for you. Follow his or her instructions about lifting, driving, or climbing stairs. Lifestyle  Do not use any products that contain nicotine or tobacco, such as cigarettes, e-cigarettes, and chewing tobacco. If you need help quitting, ask your health care provider.  Do not drink alcohol if your health care provider tells you not to drink.  If you drink alcohol: ? Limit how much you have to 0-1 drink a day. ? Be aware of how much alcohol is in your drink. In the [place] one drink equals one 12 oz bottle of beer (355 mL), one 5 oz glass of wine (148 mL), or one 1 oz glass of hard liquor (44 mL).  Maintain a healthy weight. If you need to lose weight, work with your health care provider to do so safely. General instructions  Tell all the health care providers who provide care for you about your heart  condition, including your dentist. This may affect the medicines or treatment you receive.  Manage any other health conditions you have, such as hypertension or diabetes. These conditions affect your heart.  Pay attention to your mental health. You may be at higher risk for depression. ? Find ways to manage stress. ? Talk to your health care provider about depression screening and treatment.  Keep your vaccinations up to date. ? Get the flu shot (influenza vaccine) every year. ? Get the pneumococcal vaccine if you are age 30 or older.  If directed, monitor your blood pressure at home.  Keep all follow-up visits as told by your health care provider. This is important. Contact a health care provider if you:  Feel overwhelmed or sad.  Have trouble doing your daily activities. Get help right away if you:  Have pain in your chest, neck, arm, jaw, stomach, or back that recurs, and: ? It lasts for more than a few minutes. ? It is not relieved by taking the [place] health care provider prescribed.  Have unexplained: ? Heavy sweating. ? Heartburn or indigestion. ? Nausea or vomiting. ? Shortness of breath. ? Difficulty breathing. ? Fatigue. ? Nervousness or anxiety. ? Weakness. ? Diarrhea. ? Dark stools or blood in your stool.  Have sudden light-headedness or dizziness.  Have blood pressure that is higher than 180/120.  Faint.  Have thoughts about hurting yourself. These symptoms may represent a serious problem that is an emergency. Do not wait to see if the symptoms will go away. Get medical help right away. Call your local emergency services (911 in the [place] Do not drive yourself to the hospital.  Summary  Acute coronary syndrome (ACS) is when there is not enough blood and oxygen being supplied to the heart. ACS can result in chest pain or a heart attack.  Acute coronary syndrome is a medical emergency. If you have any symptoms of this condition, get help right  away.  Treatment includes medicines and procedures to open the blocked arteries and restore blood flow. This information is not intended to replace advice given to you by your health care provider. Make sure you discuss any questions you have with your health care provider. Document Revised: 06/23/2018 Document Reviewed: 02/01/2018 [employer]vier Patient Education  2020 [employer].

## 2019-10-06 NOTE — Progress Notes (Signed)
Cardiology Office Note:    Date:  10/06/2019   ID:  Christine [name], DOB 1960/05/01, MRN [id number]  PCP:  [name], MD  Cardiologist:  Buford Dresser, MD  Referring MD: [name], MD   CC: new patient consultation for CV risk and coronary calcium  History of Present Illness:    Christine [name] is a 59 y.o. female with a hx of hypertension, who is seen as a new consult at the request of [name], MD for the evaluation and management of CV risk and coronary artery calcification.  Today: Here with her husband.  Had CT for coughing, found to have coronary calcification. Reviewed calcium score today. Coughing found to be secondary to GERD, improving.  Able to work out, does weights/machines at [employer]. Also walks briskly. On average, gets 5-6 days of activity/week.  Has been working on diet, eats rare BBQ/fries but 1-2 times/month. Has a sweet tooth. Changed diet, lowered cholesterol 20 pnts and lost 8 lbs. Primarily plant based.  Cardiovascular risk factors: Prior clinical ASCVD: none Comorbid conditions: Endorse hypertension. Has had borderline hyperlipidemia previously, no meds. Denies diabetes, chronic kidney disease. Metabolic syndrome/Obesity: Highest adult weight 148 lbs. Chronic inflammatory conditions: none Tobacco use history: never Prior cardiac testing and/or incidental findings on other testing (ie coronary calcium): none  Family history: Father: hypertension, CAD with stents, mitral valve, now in his 25s. Starr Lake died of heart related issues in his 54s No other issues, mother's side without heart disease  Denies chest pain, shortness of breath at rest or with normal exertion. No PND, orthopnea, LE edema or unexpected weight gain. No syncope or palpitations.  Past Medical History:  Diagnosis Date  . Hypertension     Past Surgical History:  Procedure Laterality Date  . CESAREAN SECTION    . FOOT SURGERY Right     Current  Medications: Current Outpatient Medications on File Prior to Visit  Medication Sig  . aspirin EC 81 MG tablet Take 81 mg by mouth daily. Swallow whole.  . Bacillus Coagulans-Inulin (PROBIOTIC) 1-250 BILLION-MG CAPS Take by mouth daily.   . Magnesium 100 MG CAPS Take by mouth daily.   Marland Kitchen omeprazole (PRILOSEC) 40 MG capsule Take 1 capsule (40 mg total) by mouth daily.  . valsartan-hydrochlorothiazide (DIOVAN-HCT) 80-12.5 MG tablet Take 0.5 tablets by mouth daily.  Marland Kitchen zolpidem (AMBIEN) 10 MG tablet Take 5-10 mg by mouth at bedtime as needed.   No current facility-administered medications on file prior to visit.     Allergies:   Patient has no known allergies.   Social History   Tobacco Use  . Smoking status: Never Smoker  . Smokeless tobacco: Never Used  Vaping Use  . Vaping Use: Never used  Substance Use Topics  . Alcohol use: Yes    Comment: once weekly  . Drug use: Never    Family History: family history includes Breast cancer in her sister; Heart disease in her father.  ROS:   Please see the history of present illness.  Additional pertinent ROS: Constitutional: Negative for chills, fever, night sweats, unintentional weight loss  HENT: Negative for ear pain and hearing loss.   Eyes: Negative for loss of vision and eye pain.  Respiratory: Negative for cough, sputum, wheezing.   Cardiovascular: See HPI. Gastrointestinal: Negative for abdominal pain, melena, and hematochezia.  Genitourinary: Negative for dysuria and hematuria.  Musculoskeletal: Negative for falls and myalgias.  Skin: Negative for itching and rash.  Neurological: Negative for focal weakness,  focal sensory changes and loss of consciousness.  Endo/Heme/Allergies: Does not bruise/bleed easily.     EKGs/Labs/Other Studies Reviewed:    The following studies were reviewed today: Calcium score 09/26/19 Score 538, 99th percentile  EKG:  EKG is personally reviewed.  The ekg ordered today demonstrates NSR at 60  bpm  Recent Labs: 06/16/2019: Hemoglobin 12.4; Platelets 269.0  Recent Lipid Panel No results found for: CHOL, TRIG, HDL, CHOLHDL, VLDL, LDLCALC, LDLDIRECT  Physical Exam:    VS:  BP (!) 142/84 (BP Location: Left Arm, Patient Position: Sitting, Cuff Size: Normal)   Pulse 60   Ht 5\' 4"  (1.626 m)   Wt 140 lb 3.2 oz (63.6 kg)   SpO2 100%   BMI 24.07 kg/m     Wt Readings from Last 3 Encounters:  10/06/19 140 lb 3.2 oz (63.6 kg)  08/31/19 142 lb 3.2 oz (64.5 kg)  06/07/19 145 lb (65.8 kg)    GEN: Well nourished, well developed in no acute distress HEENT: Normal, moist mucous membranes NECK: No JVD CARDIAC: regular rhythm, normal S1 and S2, no rubs or gallops. No murmurs. VASCULAR: Radial and DP pulses 2+ bilaterally. No carotid bruits RESPIRATORY:  Clear to auscultation without rales, wheezing or rhonchi  ABDOMEN: Soft, non-tender, non-distended MUSCULOSKELETAL:  Ambulates independently SKIN: Warm and dry, no edema NEUROLOGIC:  Alert and oriented x 3. No focal neuro deficits noted. PSYCHIATRIC:  Normal affect    ASSESSMENT:    1. Coronary artery disease due to calcified coronary lesion   2. Medication management   3. Coronary artery calcification seen on CT scan   4. Pure hypercholesterolemia   5. Essential hypertension   6. Encounter to discuss test results   7. Cardiac risk counseling   8. Counseling on health promotion and disease prevention    PLAN:    Coronary calcium, as a marker for coronary artery disease -we reviewed the pros/cons of calcium scores. A cardiac CT scan for coronary calcium is a non-invasive way of obtaining information about the presence, location and extent of calcified plaque in the coronary arteries--the vessels that supply oxygen-containing blood to the heart muscle. Calcified plaque results when there is a build-up of fat and other substances under the inner layer of the artery. This material can calcify which signals the presence of  atherosclerosis, a disease of the vessel wall, also called coronary artery disease.  People with this disease have an increased risk for heart attacks. In addition, over time, progression of plaque build up (CAD) can narrow the arteries or even close off blood flow to the heart. Because calcium is a marker of CAD, the amount of calcium detected on a cardiac CT scan is a helpful prognostic tool.  -we reviewed the charts together which show the relationship between calcium score and 15 year all cause mortality -we reviewed her calcium score at length -she already has excellent lifestyle, but we discussed that diet and exercise are sometimes not enough to prevent CV disease -as her score is >400, we discussed options for further workup. She offered to consider, and after our visit called back to state she is amenable to exercise treadmill stress test -counseled on red flag warning signs that need immediate medical attention  Hypertension: blood pressure elevated today, but reports normally well controlled -monitor -continue valsartan-HCTZ  Hypercholesterolemia: with CAD, goal is <70 -we discussed the data on statins, both in terms of their long term benefit as well as the risk of side effects. Reviewed common misconceptions  about statins. Reviewed how we monitor treatment. After shared decision making, patient is agreeable to trialing statin. -will start rosuvastatin 20 mg today, repeat lipids/LFTs in 2 mos  Cardiac risk counseling and prevention recommendations: -recommend heart healthy/Mediterranean diet, with whole grains, fruits, vegetable, fish, lean meats, nuts, and olive oil. Limit salt. -recommend moderate walking, 3-5 times/week for 30-50 minutes each session. Aim for at least 150 minutes.week. Goal should be pace of 3 miles/hours, or walking 1.5 miles in 30 minutes -recommend avoidance of tobacco products. Avoid excess alcohol.  Plan for follow up: if testing unremarkable, will follow up  in 1 year. If abnormal, will follow up sooner  Total time of encounter: 117 minutes total time of encounter, including 117 minutes spent in face-to-face patient care. This time includes coordination of care and counseling regarding all of the above, with extensive questions answered. Did not include time involved reviewing chart documents/testing relevant to the patient encounter and documentation in the medical record.  Buford Dresser, MD, PhD [place]  [facility]   Medication Adjustments/Labs and Tests Ordered: Current medicines are reviewed at length with the patient today.  Concerns regarding medicines are outlined above.  Orders Placed This Encounter  Procedures  . Lipid panel  . Hepatic function panel  . EKG 12-Lead   Meds ordered this encounter  Medications  . rosuvastatin (CRESTOR) 20 MG tablet    Sig: Take 1 tablet (20 mg total) by mouth daily.    Dispense:  90 tablet    Refill:  3    Patient Instructions  Medication Instructions:  Start Crestor 20 mg daily  *If you need a refill on your cardiac medications before your next appointment, please call your pharmacy*   Lab Work: Your physician recommends that you return for lab work in 2 months ( fasting lipids, LFT).  If you have labs (blood work) drawn today and your tests are completely normal, you will receive your results only by: Marland Kitchen MyChart Message (if you have MyChart) OR . A paper copy in the mail If you have any lab test that is abnormal or we need to change your treatment, we will call you to review the results.   Testing/Procedures: None   Follow-Up: At [facility], you and your health needs are our priority.  As part of our continuing mission to provide you with exceptional heart care, we have created designated Provider Care Teams.  These Care Teams include your primary Cardiologist (physician) and Advanced Practice Providers (APPs -  Physician Assistants and Nurse Practitioners) who all  work together to provide you with the care you need, when you need it.  We recommend signing up for the patient portal called "MyChart".  Sign up information is provided on this After Visit Summary.  MyChart is used to connect with patients for Virtual Visits (Telemedicine).  Patients are able to view lab/test results, encounter notes, upcoming appointments, etc.  Non-urgent messages can be sent to your provider as well.   To learn more about what you can do with MyChart, go to [email or web address].    Your next appointment:   1 year(s)  The format for your next appointment:   In Person  Provider:   Buford Dresser, MD     Acute Coronary Syndrome Acute coronary syndrome (ACS) is a serious problem in which there is suddenly not enough blood and oxygen reaching the heart. ACS can result in chest pain or a heart attack. This condition is a medical emergency. If you have  any symptoms of this condition, get help right away. What are the causes? This condition may be caused by:  A buildup of fat and cholesterol inside the arteries (atherosclerosis). This is the most common cause. The buildup (plaque) can cause blood vessels in the heart (coronary arteries) to become narrow or blocked, which reduces blood flow to the heart. Plaque can also break off and lead to a clot, which can block an artery and cause a heart attack or stroke.  Sudden tightening of the muscles around the coronary arteries (coronary spasm).  Tearing of a coronary artery (spontaneous coronary artery dissection).  Very low blood pressure (hypotension).  An abnormal heartbeat (arrhythmia).  Other medical conditions that cause a decrease of oxygen to the heart, such as anemiaorrespiratory failure.  Using cocaine or methamphetamine. What increases the risk? The following factors may make you more likely to develop this condition:  Age. The risk for ACS increases as you get older.  History of chest pain, heart  attack, peripheral artery disease, or stroke.  Having taken chemotherapy or immune-suppressing medicines.  Being female.  Family history of chest pain, heart disease, or stroke.  Smoking.  Not exercising enough.  Being overweight.  High cholesterol.  High blood pressure (hypertension).  Diabetes.  Excessive alcohol use. What are the signs or symptoms? Common symptoms of this condition include:  Chest pain. The pain may last a long time, or it may stop and come back (recur). It may feel like: ? Crushing or squeezing. ? Tightness, pressure, fullness, or heaviness.  Arm, neck, jaw, or back pain.  Heartburn or indigestion.  Shortness of breath.  Nausea.  Sudden cold sweats.  Light-headedness.  Dizziness or passing out.  Tiredness (fatigue). Sometimes there are no symptoms. How is this diagnosed? This condition may be diagnosed based on:  Your medical history and symptoms.  Imaging tests, such as: ? An electrocardiogram (ECG). This measures the heart's electrical activity. ? X-rays. ? CT scan. ? A coronary angiogram. For this test, dye is injected into the heart arteries and then X-rays are taken. ? Myocardial perfusion imaging. This test shows how well blood flows through your heart muscle.  Blood tests. These may be repeated at certain time intervals.  Exercise stress testing.  Echocardiogram. This is a test that uses sound waves to produce detailed images of the heart. How is this treated? Treatment for this condition may include:  Oxygen therapy.  Medicines, such as: ? Antiplatelet medicines and blood-thinning medicines, such as aspirin. These help prevent blood clots. ? Medicine that dissolves any blood clots (fibrinolytic therapy). ? Blood pressure medicines. ? Nitroglycerin. This helps widen blood vessels to improve blood flow. ? Pain medicine. ? Cholesterol-lowering medicine.  Surgery, such as: ? Coronary angioplasty with stent placement.  This involves placing a small piece of metal that looks like mesh or a spring into a narrow coronary artery. This widens the artery and keeps it open. ? Coronary artery bypass surgery. This involves taking a section of a blood vessel from a different part of your body and placing it on the blocked coronary artery to allow blood to flow around the blockage.  Cardiac rehabilitation. This is a program that includes exercise training, education, and counseling to help you recover. Follow these instructions at home: Eating and drinking  Eat a heart-healthy diet that includes whole grains, fruits and vegetables, lean proteins, and low-fat or nonfat dairy products.  Limit how much salt (sodium) you eat as told by your health care  provider. Follow instructions from your health care provider about any other eating or drinking restrictions, such as limiting foods that are high in fat and processed sugars.  Use healthy cooking methods such as roasting, grilling, broiling, baking, poaching, steaming, or stir-frying.  Work with a dietitian to follow a heart-healthy eating plan. Medicines  Take over-the-counter and prescription medicines only as told by your health care provider.  Do not take these medicines unless your health care provider approves: ? Vitamin supplements that contain vitamin A or vitamin E. ? NSAIDs, such as ibuprofen, naproxen, or celecoxib. ? Hormone replacement therapy that contains estrogen.  If you are taking blood thinners: ? Talk with your health care provider before you take any medicines that contain aspirin or NSAIDs. These medicines increase your risk for dangerous bleeding. ? Take your medicine exactly as told, at the same time every day. ? Avoid activities that could cause injury or bruising, and follow instructions about how to prevent falls. ? Wear a medical alert bracelet, and carry a card that lists what medicines you take. Activity  Follow your cardiac  rehabilitation program. Do exercises as told by your physical therapist.  Ask your health care provider what activities and exercises are safe for you. Follow his or her instructions about lifting, driving, or climbing stairs. Lifestyle  Do not use any products that contain nicotine or tobacco, such as cigarettes, e-cigarettes, and chewing tobacco. If you need help quitting, ask your health care provider.  Do not drink alcohol if your health care provider tells you not to drink.  If you drink alcohol: ? Limit how much you have to 0-1 drink a day. ? Be aware of how much alcohol is in your drink. In the [place] one drink equals one 12 oz bottle of beer (355 mL), one 5 oz glass of wine (148 mL), or one 1 oz glass of hard liquor (44 mL).  Maintain a healthy weight. If you need to lose weight, work with your health care provider to do so safely. General instructions  Tell all the health care providers who provide care for you about your heart condition, including your dentist. This may affect the medicines or treatment you receive.  Manage any other health conditions you have, such as hypertension or diabetes. These conditions affect your heart.  Pay attention to your mental health. You may be at higher risk for depression. ? Find ways to manage stress. ? Talk to your health care provider about depression screening and treatment.  Keep your vaccinations up to date. ? Get the flu shot (influenza vaccine) every year. ? Get the pneumococcal vaccine if you are age 59 or older.  If directed, monitor your blood pressure at home.  Keep all follow-up visits as told by your health care provider. This is important. Contact a health care provider if you:  Feel overwhelmed or sad.  Have trouble doing your daily activities. Get help right away if you:  Have pain in your chest, neck, arm, jaw, stomach, or back that recurs, and: ? It lasts for more than a few minutes. ? It is not relieved by  taking the [place] health care provider prescribed.  Have unexplained: ? Heavy sweating. ? Heartburn or indigestion. ? Nausea or vomiting. ? Shortness of breath. ? Difficulty breathing. ? Fatigue. ? Nervousness or anxiety. ? Weakness. ? Diarrhea. ? Dark stools or blood in your stool.  Have sudden light-headedness or dizziness.  Have blood pressure that is higher than 180/120.  Faint.  Have thoughts about hurting yourself. These symptoms may represent a serious problem that is an emergency. Do not wait to see if the symptoms will go away. Get medical help right away. Call your local emergency services (911 in the [place] Do not drive yourself to the hospital.  Summary  Acute coronary syndrome (ACS) is when there is not enough blood and oxygen being supplied to the heart. ACS can result in chest pain or a heart attack.  Acute coronary syndrome is a medical emergency. If you have any symptoms of this condition, get help right away.  Treatment includes medicines and procedures to open the blocked arteries and restore blood flow. This information is not intended to replace advice given to you by your health care provider. Make sure you discuss any questions you have with your health care provider. Document Revised: 06/23/2018 Document Reviewed: 02/01/2018 Elsevier Patient Education  2020 [employer].    Signed, Buford Dresser, MD PhD 10/06/2019 5:42 PM    [place]

## 2019-10-07 ENCOUNTER — Telehealth: Payer: Self-pay | Admitting: Cardiology

## 2019-10-07 DIAGNOSIS — R079 Chest pain, unspecified: Secondary | ICD-10-CM

## 2019-10-07 NOTE — Telephone Encounter (Signed)
Forwarding to Dr. Harrell Gave for advisement

## 2019-10-07 NOTE — Telephone Encounter (Signed)
New message:    Patient calling for a stress test, there is not a order.

## 2019-10-11 NOTE — Telephone Encounter (Signed)
Patient following up on order for stress test.

## 2019-10-12 NOTE — Telephone Encounter (Signed)
Nurse spoke to pt who voiced she is ready to proceed with stress test. Orders placed and instructions provided to pt. Message sent to scheduling to arrange test and COVID date/time.

## 2019-10-13 ENCOUNTER — Telehealth (HOSPITAL_COMMUNITY): Payer: Self-pay

## 2019-10-13 NOTE — Telephone Encounter (Signed)
Encounter complete. 

## 2019-10-14 ENCOUNTER — Other Ambulatory Visit (HOSPITAL_COMMUNITY)
Admission: RE | Admit: 2019-10-14 | Discharge: 2019-10-14 | Disposition: A | Discharge status: Home / Self Care | Payer: PRIVATE HEALTH INSURANCE | Source: Ambulatory Visit | Attending: Cardiology | Admitting: Cardiology

## 2019-10-14 DIAGNOSIS — Z01812 Encounter for preprocedural laboratory examination: Secondary | ICD-10-CM | POA: Insufficient documentation

## 2019-10-14 DIAGNOSIS — Z20822 Contact with and (suspected) exposure to covid-19: Secondary | ICD-10-CM | POA: Diagnosis not present

## 2019-10-14 LAB — SARS CORONAVIRUS 2 (TAT 6-24 HRS): SARS Coronavirus 2: NEGATIVE

## 2019-10-18 ENCOUNTER — Other Ambulatory Visit: Payer: Self-pay

## 2019-10-18 ENCOUNTER — Ambulatory Visit (HOSPITAL_COMMUNITY)
Admission: RE | Admit: 2019-10-18 | Discharge: 2019-10-18 | Disposition: A | Discharge status: Home / Self Care | Payer: PRIVATE HEALTH INSURANCE | Source: Ambulatory Visit | Attending: Internal Medicine | Admitting: Internal Medicine

## 2019-10-18 DIAGNOSIS — R079 Chest pain, unspecified: Secondary | ICD-10-CM | POA: Diagnosis present

## 2019-10-18 LAB — EXERCISE TOLERANCE TEST
Estimated workload: 13.4 METS
Exercise duration (min): 11 min
Exercise duration (sec): 5 s
MPHR: 161 {beats}/min
Peak HR: 151 {beats}/min
Percent HR: 93 %
Rest HR: 64 {beats}/min

## 2019-10-31 ENCOUNTER — Encounter: Payer: Self-pay | Admitting: Cardiology

## 2019-10-31 ENCOUNTER — Ambulatory Visit (INDEPENDENT_AMBULATORY_CARE_PROVIDER_SITE_OTHER): Payer: PRIVATE HEALTH INSURANCE | Admitting: Cardiology

## 2019-10-31 ENCOUNTER — Other Ambulatory Visit: Payer: Self-pay

## 2019-10-31 VITALS — BP 130/71 | HR 75 | Temp 98.2°F | Ht 64.0 in | Wt 138.4 lb

## 2019-10-31 DIAGNOSIS — I251 Atherosclerotic heart disease 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: Secondary | ICD-10-CM | POA: Diagnosis not present

## 2019-10-31 DIAGNOSIS — E7800 Pure hypercholesterolemia, unspecified: Secondary | ICD-10-CM | POA: Diagnosis not present

## 2019-10-31 DIAGNOSIS — Z01812 Encounter for preprocedural laboratory examination: Secondary | ICD-10-CM

## 2019-10-31 DIAGNOSIS — Z712 Person consulting for explanation of examination or test findings: Secondary | ICD-10-CM

## 2019-10-31 DIAGNOSIS — I2584 Coronary atherosclerosis due to calcified coronary lesion: Secondary | ICD-10-CM

## 2019-10-31 DIAGNOSIS — I10 Essential (primary) hypertension: Secondary | ICD-10-CM

## 2019-10-31 DIAGNOSIS — R9439 Abnormal result of other cardiovascular function study: Secondary | ICD-10-CM

## 2019-10-31 DIAGNOSIS — Z7189 Other specified counseling: Secondary | ICD-10-CM

## 2019-10-31 MED ORDER — METOPROLOL TARTRATE 50 MG PO TABS: ORAL_TABLET | ORAL | 0 refills | Status: DC

## 2019-10-31 NOTE — Progress Notes (Signed)
Cardiology Office Note:    Date:  10/31/2019   ID:  HALSTON FAIRCLOUGH, DOB 1960/11/14, MRN [id number]  PCP:  [name], MD  Cardiologist:  Buford Dresser, MD  Referring MD: [name], MD   CC: follow up  History of Present Illness:    Christine [name] is a 59 y.o. female with a hx of hypertension, who is seen for follow up today. I initially saw her in consult on 10/06/19 for elevated calcium score.  Today: Reviewed results of exercise treadmill stress test. No chest pain while she completed this. Continues to have mild shortness of breath with significant exertion, but no chest pain.  Spent time discussing options for next step of evaluation, see below.  Denies chest pain, shortness of breath at rest or with mild exertion. No PND, orthopnea, LE edema or unexpected weight gain. No syncope or palpitations.   Past Medical History:  Diagnosis Date  . Hypertension     Past Surgical History:  Procedure Laterality Date  . CESAREAN SECTION    . FOOT SURGERY Right     Current Medications: Current Outpatient Medications on File Prior to Visit  Medication Sig  . aspirin EC 81 MG tablet Take 81 mg by mouth daily. Swallow whole.  . Bacillus Coagulans-Inulin (PROBIOTIC) 1-250 BILLION-MG CAPS Take by mouth daily.   . Magnesium 100 MG CAPS Take by mouth daily.   Marland Kitchen omeprazole (PRILOSEC) 40 MG capsule Take 1 capsule (40 mg total) by mouth daily.  . rosuvastatin (CRESTOR) 20 MG tablet Take 1 tablet (20 mg total) by mouth daily.  . valsartan-hydrochlorothiazide (DIOVAN-HCT) 80-12.5 MG tablet Take 0.5 tablets by mouth daily.  Marland Kitchen zolpidem (AMBIEN) 10 MG tablet Take 5-10 mg by mouth at bedtime as needed.   No current facility-administered medications on file prior to visit.     Allergies:   Patient has no known allergies.   Social History   Tobacco Use  . Smoking status: Never Smoker  . Smokeless tobacco: Never Used  Vaping Use  . Vaping Use: Never used  Substance Use  Topics  . Alcohol use: Yes    Comment: once weekly  . Drug use: Never    Family History: family history includes Breast cancer in her sister; Heart disease in her father.  Father: hypertension, CAD with stents, mitral valve, now in his 45s. Starr Lake died of heart related issues in his 72s No other issues, mother's side without heart disease  ROS:   Please see the history of present illness.  Additional pertinent ROS otherwise unremarkable.   EKGs/Labs/Other Studies Reviewed:    The following studies were reviewed today: ETT October 28, 2019  Blood pressure demonstrated a normal response to exercise.  ST segment depression was noted during stress.   ETT with good exercise tolerance (11:05); no chest pain, normal blood pressure response; ST depression of approximately 1 to 2 mm in the inferior leads possibly upsloping (borderline ST changes); if high suspicion suggest further evaluation such as cardiac CTA or stress nuclear study.  Ca score 09/26/19 Total score 538. Predominantly in LAD territory  Echo 2018 - Left ventricle: The cavity size was normal. Systolic function was  normal. The estimated ejection fraction was in the range of 55%  to 60%. Wall motion was normal; there were no regional wall  motion abnormalities. Doppler parameters are consistent with  abnormal left ventricular relaxation (grade 1 diastolic  dysfunction).  - Atrial septum: No defect or patent foramen ovale was identified.  Echo contrast  study showed no right-to-left atrial level shunt,  at baseline or with provocation.   EKG:  EKG is personally reviewed.  The ekg ordered 10/06/19 demonstrates NSR at 60 bpm  Recent Labs: 06/16/2019: Hemoglobin 12.4; Platelets 269.0  Recent Lipid Panel No results found for: CHOL, TRIG, HDL, CHOLHDL, VLDL, LDLCALC, LDLDIRECT  Physical Exam:    VS:  BP 130/71   Pulse 75   Temp 98.2 F (36.8 C)   Ht 5' 4"  (1.626 m)   Wt 138 lb 6.4 oz (62.8 kg)   SpO2  99%   BMI 23.76 kg/m     Wt Readings from Last 3 Encounters:  10/31/19 138 lb 6.4 oz (62.8 kg)  10/06/19 140 lb 3.2 oz (63.6 kg)  08/31/19 142 lb 3.2 oz (64.5 kg)    GEN: Well nourished, well developed in no acute distress HEENT: Normal, moist mucous membranes NECK: No JVD CARDIAC: regular rhythm, normal S1 and S2, no rubs or gallops. No murmur. VASCULAR: Radial and DP pulses 2+ bilaterally. No carotid bruits RESPIRATORY:  Clear to auscultation without rales, wheezing or rhonchi  ABDOMEN: Soft, non-tender, non-distended MUSCULOSKELETAL:  Ambulates independently SKIN: Warm and dry, no edema NEUROLOGIC:  Alert and oriented x 3. No focal neuro deficits noted. PSYCHIATRIC:  Normal affect   ASSESSMENT:    1. Abnormal stress ECG with treadmill   2. Coronary artery calcification seen on CT scan   3. Coronary artery disease due to calcified coronary lesion   4. Pure hypercholesterolemia   5. Essential hypertension   6. Cardiac risk counseling   7. Encounter to discuss test results   8. Counseling on health promotion and disease prevention   9. Pre-procedure lab exam    PLAN:    Elevated calcium score: -discussed this is synonymous with coronary disease, suspect nonobstructive but cannot yet be sure -with symptom of dyspnea on exertion with elevated Ca score, pursued exercise treadmill test -ETT with indeterminate ST changes, predominantly upsloping but abnormal test -no chest pain. We discussed indications for cath (largely symptom management), and as we have not gotten a definitive answer, will attempt imaging first. -discussed nuclear stress/lexiscan, and CT coronary angiography. Discussed pros and cons of each, including but not limited to false positive/false negative risk, radiation risk, and risk of IV contrast dye. Based on shared decision making, decision was made to pursue CT coronary angiography. -will give one time dose of metoprolol 2 hours prior to scheduled  test -counseled on need to get BMET prior to test -counseled on use of sublingual nitroglycerin and its importance to a good test -counseled on red flag warning signs that need immediate medical attention -started on aspirin and statin at last visit  Hypercholesterolemia: -started on statin at last visit -recheck lipids/LFTs at follow up  Hypertension: -at goal today -continue valsartan-HCTZ  Cardiac risk counseling and prevention recommendations: -recommend heart healthy/Mediterranean diet, with whole grains, fruits, vegetable, fish, lean meats, nuts, and olive oil. Limit salt. -recommend moderate walking, 3-5 times/week for 30-50 minutes each session. Aim for at least 150 minutes.week. Goal should be pace of 3 miles/hours, or walking 1.5 miles in 30 minutes -recommend avoidance of tobacco products. Avoid excess alcohol.  Plan for follow up: about 5 weeks, after CT complete  Buford Dresser, MD, PhD [place]  [facility]    Medication Adjustments/Labs and Tests Ordered: Current medicines are reviewed at length with the patient today.  Concerns regarding medicines are outlined above.  Orders Placed This Encounter  Procedures  .  CT CORONARY MORPH W/CTA COR W/SCORE W/CA W/CM &/OR WO/CM  . CT CORONARY FRACTIONAL FLOW RESERVE DATA PREP  . CT CORONARY FRACTIONAL FLOW RESERVE FLUID ANALYSIS  . Basic metabolic panel   Meds ordered this encounter  Medications  . metoprolol tartrate (LOPRESSOR) 50 MG tablet    Sig: TAKE 1 TABLET 2 HR PRIOR TO CARDIAC PROCEDURE    Dispense:  1 tablet    Refill:  0    Patient Instructions  Medication Instructions:  Your Physician recommend you continue on your current medication as directed.    *If you need a refill on your cardiac medications before your next appointment, please call your pharmacy*   Lab Work: Your physician recommends that you return for lab work today ( BMP)  If you have labs (blood work) drawn today and your  tests are completely normal, you will receive your results only by: Marland Kitchen MyChart Message (if you have MyChart) OR . A paper copy in the mail If you have any lab test that is abnormal or we need to change your treatment, we will call you to review the results.   Testing/Procedures: Cardiac CT Angiography (CTA), is a special type of CT scan that uses a computer to produce multi-dimensional views of major blood vessels throughout the body. In CT angiography, a contrast material is injected through an IV to help visualize the blood vessels [facility]   Follow-Up: At [facility], you and your health needs are our priority.  As part of our continuing mission to provide you with exceptional heart care, we have created designated Provider Care Teams.  These Care Teams include your primary Cardiologist (physician) and Advanced Practice Providers (APPs -  Physician Assistants and Nurse Practitioners) who all work together to provide you with the care you need, when you need it.  We recommend signing up for the patient portal called "MyChart".  Sign up information is provided on this After Visit Summary.  MyChart is used to connect with patients for Virtual Visits (Telemedicine).  Patients are able to view lab/test results, encounter notes, upcoming appointments, etc.  Non-urgent messages can be sent to your provider as well.   To learn more about what you can do with MyChart, go to [email or web address].    Your next appointment:   5 week(s)  The format for your next appointment:   In Person  Provider:   Buford Dresser, MD  Your cardiac CT will be scheduled at one of the below locations:   [facility] [address] [place], [place] [address] [phone]   If scheduled at [facility], please arrive at the [facility] main entrance of [facility] 30 minutes prior to test start time. Proceed to the [facility] Radiology Department (first floor)  to check-in and test prep.  If scheduled at [facility], please arrive 15 mins early for check-in and test prep.  Please follow these instructions carefully (unless otherwise directed):   On the Night Before the Test: . Be sure to Drink plenty of water. . Do not consume any caffeinated/decaffeinated beverages or chocolate 12 hours prior to your test. . Do not take any antihistamines 12 hours prior to your test.  On the Day of the Test: . Drink plenty of water. Do not drink any water within one hour of the test. . Do not eat any food 4 hours prior to the test. . You may take your regular medications prior to the test.  .  Take metoprolol (Lopressor) 50 mg two hours prior to test. . HOLD Valsartan-Hydrochlorothiazide morning of the test. . FEMALES- please wear underwire-free bra if available        After the Test: . Drink plenty of water. . After receiving IV contrast, you may experience a mild flushed feeling. This is normal. . On occasion, you may experience a mild rash up to 24 hours after the test. This is not dangerous. If this occurs, you can take Benadryl 25 mg and increase your fluid intake. . If you experience trouble breathing, this can be serious. If it is severe call 911 IMMEDIATELY. If it is mild, please call our office. . If you take any of these medications: Glipizide/Metformin, Avandament, Glucavance, please do not take 48 hours after completing test unless otherwise instructed.   Once we have confirmed authorization from your insurance company, we will call you to set up a date and time for your test. Based on how quickly your insurance processes prior authorizations requests, please allow up to 4 weeks to be contacted for scheduling your Cardiac CT appointment. Be advised that routine Cardiac CT appointments could be scheduled as many as 8 weeks after your provider has ordered it.  For non-scheduling related questions, please contact the  cardiac imaging nurse navigator should you have any questions/concerns: Marchia Bond, Cardiac Imaging Nurse Navigator Burley Saver, Interim Cardiac Imaging Nurse [place] [facility] Direct Office Dial: [phone]   For scheduling needs, including cancellations and rescheduling, please call Vivien Rota at [phone], option 3.       Signed, Buford Dresser, MD PhD 10/31/2019 6:51 PM    [place]

## 2019-10-31 NOTE — Patient Instructions (Signed)
Medication Instructions:  Your Physician recommend you continue on your current medication as directed.    *If you need a refill on your cardiac medications before your next appointment, please call your pharmacy*   Lab Work: Your physician recommends that you return for lab work today ( BMP)  If you have labs (blood work) drawn today and your tests are completely normal, you will receive your results only by: Marland Kitchen MyChart Message (if you have MyChart) OR . A paper copy in the mail If you have any lab test that is abnormal or we need to change your treatment, we will call you to review the results.   Testing/Procedures: Cardiac CT Angiography (CTA), is a special type of CT scan that uses a computer to produce multi-dimensional views of major blood vessels throughout the body. In CT angiography, a contrast material is injected through an IV to help visualize the blood vessels [facility]   Follow-Up: At [facility], you and your health needs are our priority.  As part of our continuing mission to provide you with exceptional heart care, we have created designated Provider Care Teams.  These Care Teams include your primary Cardiologist (physician) and Advanced Practice Providers (APPs -  Physician Assistants and Nurse Practitioners) who all work together to provide you with the care you need, when you need it.  We recommend signing up for the patient portal called "MyChart".  Sign up information is provided on this After Visit Summary.  MyChart is used to connect with patients for Virtual Visits (Telemedicine).  Patients are able to view lab/test results, encounter notes, upcoming appointments, etc.  Non-urgent messages can be sent to your provider as well.   To learn more about what you can do with MyChart, go to [email or web address].    Your next appointment:   5 week(s)  The format for your next appointment:   In Person  Provider:   Buford Dresser, MD  Your  cardiac CT will be scheduled at one of the below locations:   [facility] [address] [place], [place] [address] [phone]   If scheduled at [facility], please arrive at the [facility] main entrance of [facility] 30 minutes prior to test start time. Proceed to the [facility] Radiology Department (first floor) to check-in and test prep.  If scheduled at [facility], please arrive 15 mins early for check-in and test prep.  Please follow these instructions carefully (unless otherwise directed):   On the Night Before the Test: . Be sure to Drink plenty of water. . Do not consume any caffeinated/decaffeinated beverages or chocolate 12 hours prior to your test. . Do not take any antihistamines 12 hours prior to your test.  On the Day of the Test: . Drink plenty of water. Do not drink any water within one hour of the test. . Do not eat any food 4 hours prior to the test. . You may take your regular medications prior to the test.  . Take metoprolol (Lopressor) 50 mg two hours prior to test. . HOLD Valsartan-Hydrochlorothiazide morning of the test. . FEMALES- please wear underwire-free bra if available        After the Test: . Drink plenty of water. . After receiving IV contrast, you may experience a mild flushed feeling. This is normal. . On occasion, you may experience a mild rash up to 24 hours after the test. This is not dangerous. If this occurs, you can  take Benadryl 25 mg and increase your fluid intake. . If you experience trouble breathing, this can be serious. If it is severe call 911 IMMEDIATELY. If it is mild, please call our office. . If you take any of these medications: Glipizide/Metformin, Avandament, Glucavance, please do not take 48 hours after completing test unless otherwise instructed.   Once we have confirmed authorization from your insurance company, we will call you to set up a date and time for your  test. Based on how quickly your insurance processes prior authorizations requests, please allow up to 4 weeks to be contacted for scheduling your Cardiac CT appointment. Be advised that routine Cardiac CT appointments could be scheduled as many as 8 weeks after your provider has ordered it.  For non-scheduling related questions, please contact the cardiac imaging nurse navigator should you have any questions/concerns: Marchia Bond, Cardiac Imaging Nurse Navigator Burley Saver, Interim Cardiac Imaging Nurse [place] [facility] Services Direct Office Dial: [phone]   For scheduling needs, including cancellations and rescheduling, please call Vivien Rota at [phone], option 3.

## 2019-11-01 LAB — BASIC METABOLIC PANEL
BUN/Creatinine Ratio: 15 (ref 9–23)
BUN: 11 mg/dL (ref 6–24)
CO2: 25 mmol/L (ref 20–29)
Calcium: 9.2 mg/dL (ref 8.7–10.2)
Chloride: 105 mmol/L (ref 96–106)
Creatinine, Ser: 0.73 mg/dL (ref 0.57–1.00)
GFR calc Af Amer: 104 mL/min/{1.73_m2} (ref >59)
GFR calc non Af Amer: 90 mL/min/{1.73_m2} (ref >59)
Glucose: 91 mg/dL (ref 65–99)
Potassium: 4.7 mmol/L (ref 3.5–5.2)
Sodium: 142 mmol/L (ref 134–144)

## 2019-11-17 ENCOUNTER — Other Ambulatory Visit: Payer: Self-pay | Admitting: Pulmonary Disease

## 2019-11-17 DIAGNOSIS — R059 Cough, unspecified: Secondary | ICD-10-CM

## 2019-11-23 ENCOUNTER — Ambulatory Visit: Payer: PRIVATE HEALTH INSURANCE | Admitting: Interventional Cardiology

## 2019-11-24 ENCOUNTER — Telehealth (HOSPITAL_COMMUNITY): Payer: Self-pay | Admitting: Emergency Medicine

## 2019-11-24 NOTE — Telephone Encounter (Signed)
Reaching out to patient to offer assistance regarding upcoming cardiac imaging study; pt verbalizes understanding of appt date/time, parking situation and where to check in, pre-test NPO status and medications ordered, and verified current allergies; name and call back number provided for further questions should they arise Samaira Holzworth RN Navigator Cardiac Imaging [facility] [phone] office [phone] cell 

## 2019-11-25 ENCOUNTER — Other Ambulatory Visit: Payer: Self-pay

## 2019-11-25 ENCOUNTER — Ambulatory Visit (HOSPITAL_COMMUNITY)
Admission: RE | Admit: 2019-11-25 | Discharge: 2019-11-25 | Disposition: A | Discharge status: Home / Self Care | Payer: PRIVATE HEALTH INSURANCE | Source: Ambulatory Visit | Attending: Cardiology | Admitting: Cardiology

## 2019-11-25 DIAGNOSIS — R9439 Abnormal result of other cardiovascular function study: Secondary | ICD-10-CM

## 2019-11-25 DIAGNOSIS — I2584 Coronary atherosclerosis due to calcified coronary lesion: Secondary | ICD-10-CM | POA: Diagnosis present

## 2019-11-25 DIAGNOSIS — R9430 Abnormal result of cardiovascular function study, unspecified: Secondary | ICD-10-CM

## 2019-11-25 DIAGNOSIS — I251 Atherosclerotic heart disease 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: Secondary | ICD-10-CM | POA: Insufficient documentation

## 2019-11-25 MED ORDER — NITROGLYCERIN 0.4 MG SL SUBL
0.8000 mg | SUBLINGUAL_TABLET | Freq: Once | SUBLINGUAL | Status: AC
  Administered 2019-11-25: 0.8 mg via SUBLINGUAL

## 2019-11-25 MED ORDER — IOHEXOL 350 MG/ML SOLN
80.0000 mL | Freq: Once | INTRAVENOUS | Status: AC | PRN
  Administered 2019-11-25: 80 mL via INTRAVENOUS

## 2019-11-25 MED ORDER — NITROGLYCERIN 0.4 MG SL SUBL
SUBLINGUAL_TABLET | SUBLINGUAL | Status: AC
  Filled 2019-11-25: qty 2

## 2019-11-25 NOTE — Progress Notes (Signed)
CT scan completed. Tolerated well. D/C home ambulatory, awake and alert. In no distress. 

## 2019-11-27 ENCOUNTER — Ambulatory Visit (HOSPITAL_COMMUNITY)
Admission: RE | Admit: 2019-11-27 | Discharge: 2019-11-27 | Disposition: A | Discharge status: Home / Self Care | Payer: PRIVATE HEALTH INSURANCE | Source: Ambulatory Visit | Attending: Cardiology | Admitting: Cardiology

## 2019-11-27 DIAGNOSIS — I251 Atherosclerotic heart disease 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: Secondary | ICD-10-CM | POA: Insufficient documentation

## 2019-11-27 DIAGNOSIS — I2584 Coronary atherosclerosis due to calcified coronary lesion: Secondary | ICD-10-CM | POA: Insufficient documentation

## 2019-11-27 DIAGNOSIS — R9439 Abnormal result of other cardiovascular function study: Secondary | ICD-10-CM | POA: Insufficient documentation

## 2019-11-29 DIAGNOSIS — R9430 Abnormal result of cardiovascular function study, unspecified: Secondary | ICD-10-CM | POA: Diagnosis not present

## 2019-11-30 LAB — HEPATIC FUNCTION PANEL
ALT: 19 IU/L (ref 0–32)
AST: 19 IU/L (ref 0–40)
Albumin: 4.5 g/dL (ref 3.8–4.9)
Alkaline Phosphatase: 68 IU/L (ref 44–121)
Bilirubin Total: 0.8 mg/dL (ref 0.0–1.2)
Bilirubin, Direct: 0.23 mg/dL (ref 0.00–0.40)
Total Protein: 6.6 g/dL (ref 6.0–8.5)

## 2019-11-30 LAB — LIPID PANEL
Chol/HDL Ratio: 2.4 ratio (ref 0.0–4.4)
Cholesterol, Total: 138 mg/dL (ref 100–199)
HDL: 58 mg/dL (ref >39)
LDL Chol Calc (NIH): 64 mg/dL (ref 0–99)
Triglycerides: 81 mg/dL (ref 0–149)
VLDL Cholesterol Cal: 16 mg/dL (ref 5–40)

## 2019-12-06 ENCOUNTER — Encounter: Payer: Self-pay | Admitting: Cardiology

## 2019-12-06 ENCOUNTER — Ambulatory Visit (INDEPENDENT_AMBULATORY_CARE_PROVIDER_SITE_OTHER): Payer: PRIVATE HEALTH INSURANCE | Admitting: Cardiology

## 2019-12-06 ENCOUNTER — Other Ambulatory Visit: Payer: Self-pay

## 2019-12-06 VITALS — BP 106/74 | HR 54 | Ht 64.0 in | Wt 139.8 lb

## 2019-12-06 DIAGNOSIS — I251 Atherosclerotic heart disease 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: Secondary | ICD-10-CM | POA: Diagnosis not present

## 2019-12-06 DIAGNOSIS — I10 Essential (primary) hypertension: Secondary | ICD-10-CM

## 2019-12-06 DIAGNOSIS — Z7189 Other specified counseling: Secondary | ICD-10-CM

## 2019-12-06 DIAGNOSIS — E7800 Pure hypercholesterolemia, unspecified: Secondary | ICD-10-CM | POA: Diagnosis not present

## 2019-12-06 MED ORDER — NITROGLYCERIN 0.4 MG SL SUBL: 0.4000 mg | SUBLINGUAL_TABLET | SUBLINGUAL | 3 refills | Status: AC | PRN

## 2019-12-06 NOTE — Progress Notes (Signed)
Cardiology Office Note:    Date:  12/06/2019   ID:  Christine [name], DOB August 18, 1960, MRN [id number]  PCP:  [name], MD  Cardiologist:  Buford Dresser, MD  Referring MD: [name], MD   CC: follow up  History of Present Illness:    Christine [name] is a 59 y.o. female with a hx of hypertension, who is seen for follow up today. I initially saw her in consult on 10/06/19 for elevated calcium score.  Today: Here with her husband today. Reviewed the CT and FFR results in depth, including the FFR map images today. Discussed monitoring for effects at length. Doing weight lifting 3 days/week without issue. Then [place] 3 days/[place]. Legs do feel tired, feels more out of breath when she pushes hard. No chest pain/tightness, doesn't need to stop and rest.   Tolerating crestor, no side effects. Also on aspirin without issue. Reviewed recent lipids, LDL 64.   Past Medical History:  Diagnosis Date  . Hypertension     Past Surgical History:  Procedure Laterality Date  . CESAREAN SECTION    . FOOT SURGERY Right     Current Medications: Current Outpatient Medications on File Prior to Visit  Medication Sig  . aspirin EC 81 MG tablet Take 81 mg by mouth daily. Swallow whole.  . Bacillus Coagulans-Inulin (PROBIOTIC) 1-250 BILLION-MG CAPS Take by mouth daily.   . Magnesium 100 MG CAPS Take by mouth daily. Pt takes a couple times a week  . omeprazole (PRILOSEC) 40 MG capsule TAKE 1 CAPSULE BY MOUTH EVERY DAY  . rosuvastatin (CRESTOR) 20 MG tablet Take 1 tablet (20 mg total) by mouth daily.  . valsartan-hydrochlorothiazide (DIOVAN-HCT) 80-12.5 MG tablet Take 0.5 tablets by mouth daily.  Marland Kitchen zolpidem (AMBIEN) 10 MG tablet Take 5-10 mg by mouth at bedtime as needed.   No current facility-administered medications on file prior to visit.     Allergies:   Patient has no known allergies.   Social History   Tobacco Use  . Smoking status: Never Smoker  .  Smokeless tobacco: Never Used  Vaping Use  . Vaping Use: Never used  Substance Use Topics  . Alcohol use: Yes    Comment: once weekly  . Drug use: Never    Family History: family history includes Breast cancer in her sister; Heart disease in her father.  Father: hypertension, CAD with stents, mitral valve, now in his 43s. [place]arr Lake died of heart related issues in his 32s No other issues, mother's side without heart disease  ROS:   Please see the history of present illness.  Additional pertinent ROS otherwise unremarkable.   EKGs/Labs/Other Studies Reviewed:    The following studies were reviewed today: CT coronary with FFR 11/26/19 FINDINGS: Coronary calcium score: The patient's coronary artery calcium score is 390, which places the patient in the 99th percentile.  Coronary arteries: Normal coronary origins.  Right dominance.  Right Coronary Artery: Normal caliber vessel, gives rise to PDA. Scattered calcified plaque. Proximal RCA with 1-24% stenosis. Trivial stenosis in mid RCA. Distal RCA with calcified plaque, but very small caliber vessel  Left Main Coronary Artery: Normal caliber vessel. No significant plaque or stenosis.  Left Anterior Descending Coronary Artery: Normal caliber vessel. Significant mixed calcified and noncalcified plaque, most prominent in the proximal LAD. There appears to be a stenosis of at least 50-70% between a small D1 and large D2. There is scattered 25-49% stenosis throughout the proximal and mid LAD.  Gives rise to small D1 and large D2 diagonal branches.  Left Circumflex Artery: Small caliber vessel. Trivial calcified plaque with 1-24% stenosis. Gives rise to medium OM branch.  Aorta: Normal size, 31 mm at the mid ascending aorta (level of the PA bifurcation) measured double oblique. Scattered calcifications. No dissection.  Aortic Valve: No calcifications. Trileaflet.  Other findings:  Normal pulmonary vein drainage  into the left atrium.  Normal left atrial appendage without a thrombus.  Normal size of the pulmonary artery.  IMPRESSION: 1. Moderate CAD, CADRADS = 3. Proximal LAD most concerning for significant stenosis. CT FFR will be performed and reported separately.  2. Coronary calcium score of 390. This was 99th percentile for age and sex matched control.  3. Normal coronary origin with right dominance.  FFR: 1. Left Main:  No significant stenosis. FFR = 0.99  2. LAD: There is no significant stenosis in the proximal LAD, FFR 0.96. However, there appears to a focal stenosis between a small D1 and a large D2, at which point the FFR drops to 0.86. There is then another less focal area of stenosis just after the takeoff of D2 where the FFR gradually decreases from 0.84 to 0.78. The distal LAD has an FFR of 0.70 without focal stenosis 3. LCX: No significant stenosis. Proximal FFR = 0.99, Distal FFR = 0.97 4. RCA: No significant stenosis. Proximal FFR = 0.98, Mid FFR = 0.94, Distal FFR = 0.94  IMPRESSION: 1. CT FFR analysis did not show any significant stenosis in the left main, left circumflex, or right coronary system. There is stenosis in the proximal LAD that is not significant by FFR, but after a focal stenosis between D1 and D2, followed by a gradual stenosis after D2, the FFR does become significant.   ETT 10/18/19  Blood pressure demonstrated a normal response to exercise.  ST segment depression was noted during stress.   ETT with good exercise tolerance (11:05); no chest pain, normal blood pressure response; ST depression of approximately 1 to 2 mm in the inferior leads possibly upsloping (borderline ST changes); if high suspicion suggest further evaluation such as cardiac CTA or stress nuclear study.  Ca score 09/26/19 Total score 538. Predominantly in LAD territory  Echo 2018 - Left ventricle: The cavity size was normal. Systolic function was  normal. The  estimated ejection fraction was in the range of 55%  to 60%. Wall motion was normal; there were no regional wall  motion abnormalities. Doppler parameters are consistent with  abnormal left ventricular relaxation (grade 1 diastolic  dysfunction).  - Atrial septum: No defect or patent foramen ovale was identified.  Echo contrast study showed no right-to-left atrial level shunt,  at baseline or with provocation.   EKG:  EKG is personally reviewed.  The ekg ordered today demonstrates sinus bradycardia at 54 bpm  Recent Labs: 06/16/2019: Hemoglobin 12.4; Platelets 269.0 10/31/2019: BUN 11; Creatinine, Ser 0.73; Potassium 4.7; Sodium 142 11/30/2019: ALT 19  Recent Lipid Panel    Component Value Date/Time   CHOL 138 11/30/2019 0825   TRIG 81 11/30/2019 0825   HDL 58 11/30/2019 0825   CHOLHDL 2.4 11/30/2019 0825   LDLCALC 64 11/30/2019 0825    Physical Exam:    VS:  BP 106/74   Pulse (!) 54   Ht 5\' 4"  (1.626 m)   Wt 139 lb 12.8 oz (63.4 kg)   SpO2 97%   BMI 24.00 kg/m     Wt Readings from Last 3 Encounters:  12/06/19 139 lb 12.8 oz (63.4 kg)  10/31/19 138 lb 6.4 oz (62.8 kg)  10/06/19 140 lb 3.2 oz (63.6 kg)    GEN: Well nourished, well developed in no acute distress HEENT: Normal, moist mucous membranes NECK: No JVD CARDIAC: regular rhythm, normal S1 and S2, no rubs or gallops. No murmur. VASCULAR: Radial and DP pulses 2+ bilaterally. No carotid bruits RESPIRATORY:  Clear to auscultation without rales, wheezing or rhonchi  ABDOMEN: Soft, non-tender, non-distended MUSCULOSKELETAL:  Ambulates independently SKIN: Warm and dry, no edema NEUROLOGIC:  Alert and oriented x 3. No focal neuro deficits noted. PSYCHIATRIC:  Normal affect   ASSESSMENT:    1. Coronary artery disease involving native coronary artery of native heart without angina pectoris   2. Pure hypercholesterolemia   3. Essential hypertension   4. Cardiac risk counseling   5. Counseling on health  promotion and disease prevention    PLAN:    CAD, found on CT cardiac -proximal LAD lesion not significant by FFR, but does become significant in distal vessel. -ETT with indeterminate ST changes, predominantly upsloping but abnormal test -no chest pain. We discussed indications for cath -counseled on red flag warning signs that need immediate medical attention -continue aspirin, statin -prescribed SL nitroglycerin, instructed on use  Hypercholesterolemia: -started on statin at last visit -lipids at goal, LFTs normal  Hypertension: -at goal today -continue valsartan-HCTZ  Cardiac risk counseling and prevention recommendations: -recommend heart healthy/Mediterranean diet, with whole grains, fruits, vegetable, fish, lean meats, nuts, and olive oil. Limit salt. -recommend moderate walking, 3-5 times/week for 30-50 minutes each session. Aim for at least 150 minutes.week. Goal should be pace of 3 miles/hours, or walking 1.5 miles in 30 minutes -recommend avoidance of tobacco products. Avoid excess alcohol.  Plan for follow up: 1 year or sooner as needed  Buford Dresser, MD, PhD [place]  [facility]    Medication Adjustments/Labs and Tests Ordered: Current medicines are reviewed at length with the patient today.  Concerns regarding medicines are outlined above.  Orders Placed This Encounter  Procedures  . EKG 12-Lead   Meds ordered this encounter  Medications  . nitroGLYCERIN (NITROSTAT) 0.4 MG SL tablet    Sig: Place 1 tablet (0.4 mg total) under the tongue every 5 (five) minutes as needed for chest pain.    Dispense:  90 tablet    Refill:  3    Patient Instructions  Medication Instructions:  Nitroglycerin: Place 1 tablet (0.4 mg total) under the tongue every 5 (five) minutes as needed for chest pain up.  Do not take more than 3 nitroglycerin tablets over 15 minutes.   *If you need a refill on your cardiac medications before your next appointment, please  call your pharmacy*   Lab Work: None ordered  Testing/Procedures: None ordered    Follow-Up: At [facility], you and your health needs are our priority.  As part of our continuing mission to provide you with exceptional heart care, we have created designated Provider Care Teams.  These Care Teams include your primary Cardiologist (physician) and Advanced Practice Providers (APPs -  Physician Assistants and Nurse Practitioners) who all work together to provide you with the care you need, when you need it.  We recommend signing up for the patient portal called "MyChart".  Sign up information is provided on this After Visit Summary.  MyChart is used to connect with patients for Virtual Visits (Telemedicine).  Patients are able to view lab/test results, encounter notes, upcoming appointments, etc.  Non-urgent messages can be sent to your provider as well.   To learn more about what you can do with MyChart, go to [email or web address].    Your next appointment:   1 year(s)  The format for your next appointment:   In Person  Provider:   Buford Dresser, MD  Nitroglycerin sublingual tablets What is this medicine? NITROGLYCERIN (nye troe GLI ser in) is a type of vasodilator. It relaxes blood vessels, increasing the blood and oxygen supply to your heart. This medicine is used to relieve chest pain caused by angina. It is also used to prevent chest pain before activities like climbing stairs, going outdoors in cold weather, or sexual activity. This medicine may be used for other purposes; ask your health care provider or pharmacist if you have questions. COMMON BRAND NAME(S): Nitroquick, Nitrostat, Nitrotab What should I tell my health care provider before I take this medicine? They need to know if you have any of these conditions:  anemia  head injury, recent stroke, or bleeding in the brain  liver disease  previous heart attack  an unusual or allergic reaction to  nitroglycerin, other medicines, foods, dyes, or preservatives  pregnant or trying to get pregnant  breast-feeding How should I use this medicine? Take this medicine by mouth as needed. At the first sign of an angina attack (chest pain or tightness) place one tablet under your tongue. You can also take this medicine 5 to 10 minutes before an event likely to produce chest pain. Follow the directions on the prescription label. Let the tablet dissolve under the tongue. Do not swallow whole. Replace the dose if you accidentally swallow it. It will help if your mouth is not dry. Saliva around the tablet will help it to dissolve more quickly. Do not eat or drink, smoke or chew tobacco while a tablet is dissolving. If you are not better within 5 minutes after taking ONE dose of nitroglycerin, call 9-1-1 immediately to seek emergency medical care. Do not take more than 3 nitroglycerin tablets over 15 minutes. If you take this medicine often to relieve symptoms of angina, your doctor or health care professional may provide you with different instructions to manage your symptoms. If symptoms do not go away after following these instructions, it is important to call 9-1-1 immediately. Do not take more than 3 nitroglycerin tablets over 15 minutes. Talk to your pediatrician regarding the use of this medicine in children. Special care may be needed. Overdosage: If you think you have taken too much of this medicine contact a poison control center or emergency room at once. NOTE: This medicine is only for you. Do not share this medicine with others. What if I miss a dose? This does not apply. This medicine is only used as needed. What may interact with this medicine? Do not take this medicine with any of the following medications:  certain migraine medicines like ergotamine and dihydroergotamine (DHE)  medicines used to treat erectile dysfunction like sildenafil, tadalafil, and vardenafil  riociguat This  medicine may also interact with the following medications:  alteplase  aspirin  heparin  medicines for high blood pressure  medicines for mental depression  other medicines used to treat angina  phenothiazines like chlorpromazine, mesoridazine, prochlorperazine, thioridazine This list may not describe all possible interactions. Give your health care provider a list of all the medicines, herbs, non-prescription drugs, or dietary supplements you use. Also tell them if you smoke, drink alcohol, or use illegal drugs. Some items may interact  with your medicine. What should I watch for while using this medicine? Tell your doctor or health care professional if you feel your medicine is no longer working. Keep this medicine with you at all times. Sit or lie down when you take your medicine to prevent falling if you feel dizzy or faint after using it. Try to remain calm. This will help you to feel better faster. If you feel dizzy, take several deep breaths and lie down with your feet propped up, or bend forward with your head resting between your knees. You may get drowsy or dizzy. Do not drive, use machinery, or do anything that needs mental alertness until you know how this drug affects you. Do not stand or sit up quickly, especially if you are an older patient. This reduces the risk of dizzy or fainting spells. Alcohol can make you more drowsy and dizzy. Avoid alcoholic drinks. Do not treat yourself for coughs, colds, or pain while you are taking this medicine without asking your doctor or health care professional for advice. Some ingredients may increase your blood pressure. What side effects may I notice from receiving this medicine? Side effects that you should report to your doctor or health care professional as soon as possible:  blurred vision  dry mouth  skin rash  sweating  the feeling of extreme pressure in the head  unusually weak or tired Side effects that usually do not  require medical attention (report to your doctor or health care professional if they continue or are bothersome):  flushing of the face or neck  headache  irregular heartbeat, palpitations  nausea, vomiting This list may not describe all possible side effects. Call your doctor for medical advice about side effects. You may report side effects to FDA at 1-800-FDA-1088. Where should I keep my medicine? Keep out of the reach of children. Store at room temperature between 20 and 25 degrees C (68 and 77 degrees F). Store in [employer]. Protect from light and moisture. Keep tightly closed. Throw away any unused medicine after the expiration date. NOTE: This sheet is a summary. It may not cover all possible information. If you have questions about this medicine, talk to your doctor, pharmacist, or health care provider.  2020 Elsevier/Gold Standard (2012-11-18 17:57:36)     Signed, Buford Dresser, MD PhD 12/06/2019     [facility]

## 2019-12-06 NOTE — Patient Instructions (Signed)
Medication Instructions:  Nitroglycerin: Place 1 tablet (0.4 mg total) under the tongue every 5 (five) minutes as needed for chest pain up.  Do not take more than 3 nitroglycerin tablets over 15 minutes.   *If you need a refill on your cardiac medications before your next appointment, please call your pharmacy*   Lab Work: None ordered  Testing/Procedures: None ordered    Follow-Up: At [facility], you and your health needs are our priority.  As part of our continuing mission to provide you with exceptional heart care, we have created designated Provider Care Teams.  These Care Teams include your primary Cardiologist (physician) and Advanced Practice Providers (APPs -  Physician Assistants and Nurse Practitioners) who all work together to provide you with the care you need, when you need it.  We recommend signing up for the patient portal called "MyChart".  Sign up information is provided on this After Visit Summary.  MyChart is used to connect with patients for Virtual Visits (Telemedicine).  Patients are able to view lab/test results, encounter notes, upcoming appointments, etc.  Non-urgent messages can be sent to your provider as well.   To learn more about what you can do with MyChart, go to [email or web address].    Your next appointment:   1 year(s)  The format for your next appointment:   In Person  Provider:   Buford Dresser, MD  Nitroglycerin sublingual tablets What is this medicine? NITROGLYCERIN (nye troe GLI ser in) is a type of vasodilator. It relaxes blood vessels, increasing the blood and oxygen supply to your heart. This medicine is used to relieve chest pain caused by angina. It is also used to prevent chest pain before activities like climbing stairs, going outdoors in cold weather, or sexual activity. This medicine may be used for other purposes; ask your health care provider or pharmacist if you have questions. COMMON BRAND NAME(S): Nitroquick,  Nitrostat, Nitrotab What should I tell my health care provider before I take this medicine? They need to know if you have any of these conditions:  anemia  head injury, recent stroke, or bleeding in the brain  liver disease  previous heart attack  an unusual or allergic reaction to nitroglycerin, other medicines, foods, dyes, or preservatives  pregnant or trying to get pregnant  breast-feeding How should I use this medicine? Take this medicine by mouth as needed. At the first sign of an angina attack (chest pain or tightness) place one tablet under your tongue. You can also take this medicine 5 to 10 minutes before an event likely to produce chest pain. Follow the directions on the prescription label. Let the tablet dissolve under the tongue. Do not swallow whole. Replace the dose if you accidentally swallow it. It will help if your mouth is not dry. Saliva around the tablet will help it to dissolve more quickly. Do not eat or drink, smoke or chew tobacco while a tablet is dissolving. If you are not better within 5 minutes after taking ONE dose of nitroglycerin, call 9-1-1 immediately to seek emergency medical care. Do not take more than 3 nitroglycerin tablets over 15 minutes. If you take this medicine often to relieve symptoms of angina, your doctor or health care professional may provide you with different instructions to manage your symptoms. If symptoms do not go away after following these instructions, it is important to call 9-1-1 immediately. Do not take more than 3 nitroglycerin tablets over 15 minutes. Talk to your pediatrician regarding the use of this  medicine in children. Special care may be needed. Overdosage: If you think you have taken too much of this medicine contact a poison control center or emergency room at once. NOTE: This medicine is only for you. Do not share this medicine with others. What if I miss a dose? This does not apply. This medicine is only used as  needed. What may interact with this medicine? Do not take this medicine with any of the following medications:  certain migraine medicines like ergotamine and dihydroergotamine (DHE)  medicines used to treat erectile dysfunction like sildenafil, tadalafil, and vardenafil  riociguat This medicine may also interact with the following medications:  alteplase  aspirin  heparin  medicines for high blood pressure  medicines for mental depression  other medicines used to treat angina  phenothiazines like chlorpromazine, mesoridazine, prochlorperazine, thioridazine This list may not describe all possible interactions. Give your health care provider a list of all the medicines, herbs, non-prescription drugs, or dietary supplements you use. Also tell them if you smoke, drink alcohol, or use illegal drugs. Some items may interact with your medicine. What should I watch for while using this medicine? Tell your doctor or health care professional if you feel your medicine is no longer working. Keep this medicine with you at all times. Sit or lie down when you take your medicine to prevent falling if you feel dizzy or faint after using it. Try to remain calm. This will help you to feel better faster. If you feel dizzy, take several deep breaths and lie down with your feet propped up, or bend forward with your head resting between your knees. You may get drowsy or dizzy. Do not drive, use machinery, or do anything that needs mental alertness until you know how this drug affects you. Do not stand or sit up quickly, especially if you are an older patient. This reduces the risk of dizzy or fainting spells. Alcohol can make you more drowsy and dizzy. Avoid alcoholic drinks. Do not treat yourself for coughs, colds, or pain while you are taking this medicine without asking your doctor or health care professional for advice. Some ingredients may increase your blood pressure. What side effects may I notice  from receiving this medicine? Side effects that you should report to your doctor or health care professional as soon as possible:  blurred vision  dry mouth  skin rash  sweating  the feeling of extreme pressure in the head  unusually weak or tired Side effects that usually do not require medical attention (report to your doctor or health care professional if they continue or are bothersome):  flushing of the face or neck  headache  irregular heartbeat, palpitations  nausea, vomiting This list may not describe all possible side effects. Call your doctor for medical advice about side effects. You may report side effects to FDA at 1-800-FDA-1088. Where should I keep my medicine? Keep out of the reach of children. Store at room temperature between 20 and 25 degrees C (68 and 77 degrees F). Store in [employer]. Protect from light and moisture. Keep tightly closed. Throw away any unused medicine after the expiration date. NOTE: This sheet is a summary. It may not cover all possible information. If you have questions about this medicine, talk to your doctor, pharmacist, or health care provider.  2020 Elsevier/Gold Standard (2012-11-18 17:57:36)

## 2020-02-05 ENCOUNTER — Encounter: Payer: Self-pay | Admitting: Cardiology

## 2020-05-17 DIAGNOSIS — Z79899 Other long term (current) drug therapy: Secondary | ICD-10-CM

## 2020-05-17 DIAGNOSIS — I251 Atherosclerotic heart disease 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: Secondary | ICD-10-CM

## 2020-06-01 MED ORDER — ROSUVASTATIN CALCIUM 40 MG PO TABS: 40.0000 mg | ORAL_TABLET | Freq: Every day | ORAL | 1 refills | Status: DC

## 2020-06-01 NOTE — Addendum Note (Signed)
Addended by: Harrington Challenger on: 06/01/2020 01:41 PM   Modules accepted: Orders

## 2020-07-30 ENCOUNTER — Encounter (HOSPITAL_BASED_OUTPATIENT_CLINIC_OR_DEPARTMENT_OTHER): Payer: Self-pay

## 2020-08-01 LAB — LIPID PANEL
Chol/HDL Ratio: 2.5 ratio (ref 0.0–4.4)
Cholesterol, Total: 116 mg/dL (ref 100–199)
HDL: 46 mg/dL (ref >39)
LDL Chol Calc (NIH): 56 mg/dL (ref 0–99)
Triglycerides: 69 mg/dL (ref 0–149)
VLDL Cholesterol Cal: 14 mg/dL (ref 5–40)

## 2020-08-01 LAB — HEPATIC FUNCTION PANEL
ALT: 21 IU/L (ref 0–32)
AST: 25 IU/L (ref 0–40)
Albumin: 4.5 g/dL (ref 3.8–4.9)
Alkaline Phosphatase: 58 IU/L (ref 44–121)
Bilirubin Total: 1.2 mg/dL (ref 0.0–1.2)
Bilirubin, Direct: 0.27 mg/dL (ref 0.00–0.40)
Total Protein: 6.7 g/dL (ref 6.0–8.5)

## 2020-11-30 ENCOUNTER — Ambulatory Visit (HOSPITAL_BASED_OUTPATIENT_CLINIC_OR_DEPARTMENT_OTHER): Payer: PRIVATE HEALTH INSURANCE | Admitting: Cardiology

## 2020-11-30 ENCOUNTER — Other Ambulatory Visit: Payer: Self-pay | Admitting: Pharmacist

## 2020-11-30 DIAGNOSIS — I251 Atherosclerotic heart disease 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: Secondary | ICD-10-CM

## 2020-12-19 ENCOUNTER — Encounter (HOSPITAL_BASED_OUTPATIENT_CLINIC_OR_DEPARTMENT_OTHER): Payer: Self-pay

## 2021-01-17 ENCOUNTER — Ambulatory Visit (HOSPITAL_BASED_OUTPATIENT_CLINIC_OR_DEPARTMENT_OTHER): Payer: PRIVATE HEALTH INSURANCE | Admitting: Cardiology

## 2021-01-23 ENCOUNTER — Other Ambulatory Visit: Payer: Self-pay

## 2021-01-23 ENCOUNTER — Ambulatory Visit (INDEPENDENT_AMBULATORY_CARE_PROVIDER_SITE_OTHER): Payer: No Typology Code available for payment source | Admitting: Cardiology

## 2021-01-23 ENCOUNTER — Encounter (HOSPITAL_BASED_OUTPATIENT_CLINIC_OR_DEPARTMENT_OTHER): Payer: Self-pay | Admitting: Cardiology

## 2021-01-23 VITALS — BP 138/78 | HR 55 | Ht 64.0 in | Wt 136.9 lb

## 2021-01-23 DIAGNOSIS — I2584 Coronary atherosclerosis due to calcified coronary lesion: Secondary | ICD-10-CM

## 2021-01-23 DIAGNOSIS — I251 Atherosclerotic heart disease 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: Secondary | ICD-10-CM | POA: Diagnosis not present

## 2021-01-23 DIAGNOSIS — E7800 Pure hypercholesterolemia, unspecified: Secondary | ICD-10-CM | POA: Diagnosis not present

## 2021-01-23 DIAGNOSIS — Z7189 Other specified counseling: Secondary | ICD-10-CM | POA: Diagnosis not present

## 2021-01-23 DIAGNOSIS — I10 Essential (primary) hypertension: Secondary | ICD-10-CM

## 2021-01-23 MED ORDER — VALSARTAN-HYDROCHLOROTHIAZIDE 80-12.5 MG PO TABS: 1.0000 | ORAL_TABLET | Freq: Every day | ORAL | 3 refills | Status: DC

## 2021-01-23 NOTE — Progress Notes (Signed)
Cardiology Office Note:    Date:  01/23/2021   ID:  Christine [name], DOB 26-Jan-1961, MRN [id number]  PCP:  [name], MD  Cardiologist:  Buford Dresser, MD  Referring MD: [name], MD   CC: follow up  History of Present Illness:    Christine [name] is a 60 y.o. female with a hx of hypertension, who is seen for follow up today. I initially saw her in consult on 10/06/19 for elevated calcium score.  Today: She is doing well. She is exercising more and eating a more plant-based diet. She is compliant and tolerating her medications. She noticed her blood pressure was high in clinic this morning and wonders if she should increase her valsartan-HCTZ to a full pill. Additionally, she wonders if Tylenol is a good pain-killer option for her to take occasionally.   She does not check her blood pressure at home because her readings were inconsistent. She believes the inconsistent readings are due to her home blood pressure cuff.   For her diet, she enjoys eating vegetables and her husband eats the same food with her. Once a month, she treats herself to barbecue.   About a year ago, she donated blood and became anemic. She has been taking iron supplements for 6 to 7 months and currently takes them every other day. She wonders if she should continue these supplements with a plant-based diet.  She denies any palpitations, chest pain, shortness of breath, lightheadedness, headaches, syncope, orthopnea, PND, lower extremity edema, exertional symptoms, or bruising.  Past Medical History:  Diagnosis Date   Hypertension     Past Surgical History:  Procedure Laterality Date   CESAREAN SECTION     FOOT SURGERY Right     Current Medications: Current Outpatient Medications on File Prior to Visit  Medication Sig   aspirin EC 81 MG tablet Take 81 mg by mouth daily. Swallow whole.   Bacillus Coagulans-Inulin (PROBIOTIC) 1-250 BILLION-MG CAPS Take by mouth daily.    Magnesium 100 MG  CAPS Take by mouth daily. Pt takes a couple times a week   omeprazole (PRILOSEC) 40 MG capsule Take 40 mg by mouth daily as needed.   rosuvastatin (CRESTOR) 40 MG tablet TAKE 1 TABLET BY MOUTH EVERY DAY   zolpidem (AMBIEN) 10 MG tablet Take 5-10 mg by mouth at bedtime as needed.   nitroGLYCERIN (NITROSTAT) 0.4 MG SL tablet Place 1 tablet (0.4 mg total) under the tongue every 5 (five) minutes as needed for chest pain.   No current facility-administered medications on file prior to visit.     Allergies:   Patient has no known allergies.   Social History   Tobacco Use   Smoking status: Never   Smokeless tobacco: Never  Vaping Use   Vaping Use: Never used  Substance Use Topics   Alcohol use: Yes    Comment: once weekly   Drug use: Never    Family History: family history includes Breast cancer in her sister; Heart disease in her father.  Father: hypertension, CAD with stents, mitral valve, now in his 36s. Starr Lake died of heart related issues in his 91s No other issues, mother's side without heart disease  ROS:   Please see the history of present illness.   Additional pertinent ROS otherwise unremarkable.  EKGs/Labs/Other Studies Reviewed:    The following studies were reviewed today: CT coronary with FFR 11/26/19 FINDINGS: Coronary calcium score: The patient's coronary artery calcium score is 390, which places the patient in  the 99th percentile.   Coronary arteries: Normal coronary origins.  Right dominance.   Right Coronary Artery: Normal caliber vessel, gives rise to PDA. Scattered calcified plaque. Proximal RCA with 1-24% stenosis. Trivial stenosis in mid RCA. Distal RCA with calcified plaque, but very small caliber vessel   Left Main Coronary Artery: Normal caliber vessel. No significant plaque or stenosis.   Left Anterior Descending Coronary Artery: Normal caliber vessel. Significant mixed calcified and noncalcified plaque, most prominent in the proximal  LAD. There appears to be a stenosis of at least 50-70% between a small D1 and large D2. There is scattered 25-49% stenosis throughout the proximal and mid LAD. Gives rise to small D1 and large D2 diagonal branches.   Left Circumflex Artery: Small caliber vessel. Trivial calcified plaque with 1-24% stenosis. Gives rise to medium OM branch.   Aorta: Normal size, 31 mm at the mid ascending aorta (level of the PA bifurcation) measured double oblique. Scattered calcifications. No dissection.   Aortic Valve: No calcifications. Trileaflet.   Other findings:   Normal pulmonary vein drainage into the left atrium.   Normal left atrial appendage without a thrombus.   Normal size of the pulmonary artery.   IMPRESSION: 1. Moderate CAD, CADRADS = 3. Proximal LAD most concerning for significant stenosis. CT FFR will be performed and reported separately.   2. Coronary calcium score of 390. This was 99th percentile for age and sex matched control.   3. Normal coronary origin with right dominance.  FFR: 1. Left Main:  No significant stenosis. FFR = 0.99   2. LAD: There is no significant stenosis in the proximal LAD, FFR 0.96. However, there appears to a focal stenosis between a small D1 and a large D2, at which point the FFR drops to 0.86. There is then another less focal area of stenosis just after the takeoff of D2 where the FFR gradually decreases from 0.84 to 0.78. The distal LAD has an FFR of 0.70 without focal stenosis 3. LCX: No significant stenosis. Proximal FFR = 0.99, Distal FFR = 0.97 4. RCA: No significant stenosis. Proximal FFR = 0.98, Mid FFR = 0.94, Distal FFR = 0.94   IMPRESSION: 1. CT FFR analysis did not show any significant stenosis in the left main, left circumflex, or right coronary system. There is stenosis in the proximal LAD that is not significant by FFR, but after a focal stenosis between D1 and D2, followed by a gradual stenosis after D2, the FFR does  become significant.   ETT 10/18/19 Blood pressure demonstrated a normal response to exercise. ST segment depression was noted during stress.   ETT with good exercise tolerance (11:05); no chest pain, normal blood pressure response; ST depression of approximately 1 to 2 mm in the inferior leads possibly upsloping (borderline ST changes); if high suspicion suggest further evaluation such as cardiac CTA or stress nuclear study.  Ca score 09/26/19 Total score 538. Predominantly in LAD territory  Echo 2018 - Left ventricle: The cavity size was normal. Systolic function was    normal. The estimated ejection fraction was in the range of 55%    to 60%. Wall motion was normal; there were no regional wall    motion abnormalities. Doppler parameters are consistent with    abnormal left ventricular relaxation (grade 1 diastolic    dysfunction).  - Atrial septum: No defect or patent foramen ovale was identified.    Echo contrast study showed no right-to-left atrial level shunt,    at baseline  or with provocation.   EKG:  EKG is personally reviewed.   01/23/21: sinus bradycardia at 55 bpm 12/06/19: sinus bradycardia at 54 bpm  Recent Labs: 08/01/2020: ALT 21  Recent Lipid Panel    Component Value Date/Time   CHOL 116 08/01/2020 1004   TRIG 69 08/01/2020 1004   HDL 46 08/01/2020 1004   CHOLHDL 2.5 08/01/2020 1004   LDLCALC 56 08/01/2020 1004    Physical Exam:    VS:  BP 138/78 (BP Location: Right Arm, Patient Position: Sitting, Cuff Size: Large)    Pulse (!) 55    Ht 5\' 4"  (1.626 m)    Wt 136 lb 14.4 oz (62.1 kg)    BMI 23.50 kg/m     Wt Readings from Last 3 Encounters:  01/23/21 136 lb 14.4 oz (62.1 kg)  12/06/19 139 lb 12.8 oz (63.4 kg)  10/31/19 138 lb 6.4 oz (62.8 kg)    GEN: Well nourished, well developed in no acute distress HEENT: Normal, moist mucous membranes NECK: No JVD CARDIAC: regular rhythm, normal S1 and S2, no rubs or gallops. No murmur. VASCULAR: Radial and DP  pulses 2+ bilaterally. No carotid bruits RESPIRATORY:  Clear to auscultation without rales, wheezing or rhonchi  ABDOMEN: Soft, non-tender, non-distended MUSCULOSKELETAL:  Ambulates independently SKIN: Warm and dry, no edema NEUROLOGIC:  Alert and oriented x 3. No focal neuro deficits noted. PSYCHIATRIC:  Normal affect   ASSESSMENT:    1. Coronary artery disease due to calcified coronary lesion   2. Pure hypercholesterolemia   3. Essential hypertension   4. Cardiac risk counseling     PLAN:    CAD, found on CT cardiac -proximal LAD lesion not significant by FFR, but does become significant in distal vessel. -ETT with indeterminate ST changes, predominantly upsloping but abnormal test -no chest pain. We discussed indications for cath -counseled on red flag warning signs that need immediate medical attention -continue aspirin, statin -has SL nitroglycerin, instructed on use -she has moved to predominantly plant based diet, which should help her cholesterol significantly  Hypercholesterolemia: -continue statin, plant based diet -lipids at goal, LFTs normal  Hypertension: -elevated today -will increase valsartan-HCTZ to full pill daily (was taking 1/2 pill)  Cardiac risk counseling and prevention recommendations: -recommend heart healthy/Mediterranean diet, with whole grains, fruits, vegetable, fish, lean meats, nuts, and olive oil. Limit salt. -recommend moderate walking, 3-5 times/week for 30-50 minutes each session. Aim for at least 150 minutes.week. Goal should be pace of 3 miles/hours, or walking 1.5 miles in 30 minutes -recommend avoidance of tobacco products. Avoid excess alcohol.  Plan for follow up: 1 year  Buford Dresser, MD, PhD [place]   [facility]    Medication Adjustments/Labs and Tests Ordered: Current medicines are reviewed at length with the patient today.  Concerns regarding medicines are outlined above.  Orders Placed This Encounter   Procedures   EKG 12-Lead   Meds ordered this encounter  Medications   valsartan-hydrochlorothiazide (DIOVAN-HCT) 80-12.5 MG tablet    Sig: Take 1 tablet by mouth daily.    Dispense:  90 tablet    Refill:  3    Patient Instructions  Medication Instructions:  Increase Valsartan-hydrochlorothiazide to 80-12.5 mg (1 tablet) daily   *If you need a refill on your cardiac medications before your next appointment, please call your pharmacy*   Lab Work: None ordered today   Testing/Procedures: None ordered today   Follow-Up: At [facility], you and your health needs are our priority.  As  part of our continuing mission to provide you with exceptional heart care, we have created designated Provider Care Teams.  These Care Teams include your primary Cardiologist (physician) and Advanced Practice Providers (APPs -  Physician Assistants and Nurse Practitioners) who all work together to provide you with the care you need, when you need it.  We recommend signing up for the patient portal called "MyChart".  Sign up information is provided on this After Visit Summary.  MyChart is used to connect with patients for Virtual Visits (Telemedicine).  Patients are able to view lab/test results, encounter notes, upcoming appointments, etc.  Non-urgent messages can be sent to your provider as well.   To learn more about what you can do with MyChart, go to [email or web address].    Your next appointment:   1 year(s)  The format for your next appointment:   In Person  Provider:   Buford Dresser, MD       Wilhemina Bonito as a scribe for Buford Dresser, MD.,have documented all relevant documentation on the behalf of Buford Dresser, MD,as directed by  Buford Dresser, MD while in the presence of Buford Dresser, MD.  I, Buford Dresser, MD, have reviewed all documentation for this visit. The documentation on 02/07/21 for the exam, diagnosis,  procedures, and orders are all accurate and complete.   Signed, Buford Dresser, MD PhD 01/23/2021     [place]

## 2021-01-23 NOTE — Patient Instructions (Signed)
Medication Instructions:  Increase Valsartan-hydrochlorothiazide to 80-12.5 mg (1 tablet) daily   *If you need a refill on your cardiac medications before your next appointment, please call your pharmacy*   Lab Work: None ordered today   Testing/Procedures: None ordered today   Follow-Up: At [facility], you and your health needs are our priority.  As part of our continuing mission to provide you with exceptional heart care, we have created designated Provider Care Teams.  These Care Teams include your primary Cardiologist (physician) and Advanced Practice Providers (APPs -  Physician Assistants and Nurse Practitioners) who all work together to provide you with the care you need, when you need it.  We recommend signing up for the patient portal called "MyChart".  Sign up information is provided on this After Visit Summary.  MyChart is used to connect with patients for Virtual Visits (Telemedicine).  Patients are able to view lab/test results, encounter notes, upcoming appointments, etc.  Non-urgent messages can be sent to your provider as well.   To learn more about what you can do with MyChart, go to [email or web address].    Your next appointment:   1 year(s)  The format for your next appointment:   In Person  Provider:   Buford Dresser, MD

## 2021-02-07 ENCOUNTER — Encounter (HOSPITAL_BASED_OUTPATIENT_CLINIC_OR_DEPARTMENT_OTHER): Payer: Self-pay | Admitting: Cardiology

## 2021-03-24 ENCOUNTER — Other Ambulatory Visit: Payer: Self-pay | Admitting: Cardiology

## 2021-03-24 DIAGNOSIS — I251 Atherosclerotic heart disease 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: Secondary | ICD-10-CM

## 2021-03-25 NOTE — Telephone Encounter (Signed)
Rx(s) sent to pharmacy electronically.  

## 2021-07-10 ENCOUNTER — Other Ambulatory Visit: Payer: Self-pay | Admitting: Obstetrics and Gynecology

## 2021-07-10 DIAGNOSIS — R928 Other abnormal and inconclusive findings on diagnostic imaging of breast: Secondary | ICD-10-CM

## 2021-07-10 DIAGNOSIS — C50919 Malignant neoplasm of unspecified site of unspecified female breast: Secondary | ICD-10-CM | POA: Insufficient documentation

## 2021-07-17 ENCOUNTER — Other Ambulatory Visit: Payer: Self-pay | Admitting: Obstetrics and Gynecology

## 2021-07-17 ENCOUNTER — Ambulatory Visit
Admission: RE | Admit: 2021-07-17 | Discharge: 2021-07-17 | Disposition: A | Discharge status: Home / Self Care | Payer: No Typology Code available for payment source | Source: Ambulatory Visit | Attending: Obstetrics and Gynecology | Admitting: Obstetrics and Gynecology

## 2021-07-17 DIAGNOSIS — R928 Other abnormal and inconclusive findings on diagnostic imaging of breast: Secondary | ICD-10-CM

## 2021-07-17 DIAGNOSIS — N6320 Unspecified lump in the left breast, unspecified quadrant: Secondary | ICD-10-CM

## 2021-07-22 ENCOUNTER — Ambulatory Visit
Admission: RE | Admit: 2021-07-22 | Discharge: 2021-07-22 | Disposition: A | Discharge status: Home / Self Care | Payer: No Typology Code available for payment source | Source: Ambulatory Visit | Attending: Obstetrics and Gynecology | Admitting: Obstetrics and Gynecology

## 2021-07-22 DIAGNOSIS — N6320 Unspecified lump in the left breast, unspecified quadrant: Secondary | ICD-10-CM

## 2021-07-24 ENCOUNTER — Telehealth: Payer: Self-pay | Admitting: Hematology and Oncology

## 2021-07-24 NOTE — Telephone Encounter (Signed)
Spoke to patient to confirm morning clinic appointment for 6/28, paperwork sent via e-mail

## 2021-07-25 ENCOUNTER — Encounter: Payer: Self-pay | Admitting: *Deleted

## 2021-07-25 DIAGNOSIS — Z170 Estrogen receptor positive status [ER+]: Secondary | ICD-10-CM

## 2021-07-29 NOTE — Progress Notes (Addendum)
Radiation Oncology         [phone] ________________________________  Name: Christine [name]        MRN: [id number]  Date of Service: 07/31/2021 DOB: 08-Jul-1960  ON:GEXBM, Herbie [place], MD  Jovita Kussmaul, MD     REFERRING PHYSICIAN: Autumn Messing III, MD   DIAGNOSIS: The encounter diagnosis was Malignant neoplasm of upper-outer quadrant of left breast in female, estrogen receptor positive ([place]).   HISTORY OF PRESENT ILLNESS: Christine [name] is a 61 y.o. female seen in the multidisciplinary breast clinic for a new diagnosis of left breast cancer. The patient was noted to have a screening detected mass in the left breast.  Further diagnostic work-up showed a mass in the lateral aspect of the left breast and by ultrasound this measured 7 mm in the 3 o'clock position.  No evidence of axillary adenopathy was appreciated.  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  Her cancer was ER positive PR negative, HER2 was equivocal by IHC and reflexed to positive FISH.  She is seen today to discuss treatment of her cancer.    PREVIOUS RADIATION THERAPY: No   PAST MEDICAL HISTORY:  Past Medical History:  Diagnosis Date   Hypertension        PAST SURGICAL HISTORY: Past Surgical History:  Procedure Laterality Date   CESAREAN SECTION     FOOT SURGERY Right      FAMILY HISTORY:  Family History  Problem Relation Age of Onset   Heart disease Father    Breast cancer Sister      SOCIAL HISTORY:  reports that she has never smoked. She has never used smokeless tobacco. She reports current alcohol use. She reports that she does not use drugs. The patient is married and lives in [place]. 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   ALLERGIES: Patient has no known allergies.   MEDICATIONS:  Current  Outpatient Medications  Medication Sig Dispense Refill   aspirin EC 81 MG tablet Take 81 mg by mouth daily. Swallow whole.     Bacillus Coagulans-Inulin (PROBIOTIC) 1-250 BILLION-MG CAPS Take by mouth daily.      Magnesium 100 MG CAPS Take by mouth daily. Pt takes a couple times a week     nitroGLYCERIN (NITROSTAT) 0.4 MG SL tablet Place 1 tablet (0.4 mg total) under the tongue every 5 (five) minutes as needed for chest pain. 90 tablet 3   omeprazole (PRILOSEC) 40 MG capsule Take 40 mg by mouth daily as needed.     rosuvastatin (CRESTOR) 40 MG tablet TAKE 1 TABLET BY MOUTH EVERY DAY 90 tablet 1   valsartan-hydrochlorothiazide (DIOVAN-HCT) 80-12.5 MG tablet Take 1 tablet by mouth daily. 90 tablet 3   zolpidem (AMBIEN) 10 MG tablet Take 5-10 mg by mouth at bedtime as needed.     No current facility-administered medications for this visit.     REVIEW OF SYSTEMS: On review of systems, the patient reports that she is doing well overall without breast specific complaints.      PHYSICAL EXAM:  Wt Readings from Last 3 Encounters:  01/23/21 136 lb 14.4 oz (62.1 kg)  12/06/19 139 lb 12.8 oz (63.4 kg)  10/31/19 138 lb 6.4 oz (62.8 kg)   Temp Readings from Last 3 Encounters:  10/31/19 98.2 F (36.8 C)  08/31/19 (!) 97.3 F (36.3  C) (Oral)  06/07/19 98.1 F (36.7 C) (Temporal)   BP Readings from Last 3 Encounters:  01/23/21 138/78  12/06/19 106/74  11/25/19 (!) 109/59   Pulse Readings from Last 3 Encounters:  01/23/21 (!) 55  12/06/19 (!) 54  11/25/19 (!) 50    In general this is a well appearing caucasian female in no acute distress. She's alert and oriented x4 and appropriate throughout the examination. Cardiopulmonary assessment is negative for acute distress and she exhibits normal effort. Bilateral breast exam is deferred.    ECOG = 0  0 - Asymptomatic (Fully active, able to carry on all predisease activities without restriction)  1 - Symptomatic but completely ambulatory  (Restricted in physically strenuous activity but ambulatory and able to carry out work of a light or sedentary nature. For example, light housework, office work)  2 - Symptomatic, <50% in bed during the day (Ambulatory and capable of all self care but unable to carry out any work activities. Up and about more than 50% of waking hours)  3 - Symptomatic, >50% in bed, but not bedbound (Capable of only limited self-care, confined to bed or chair 50% or more of waking hours)  4 - Bedbound (Completely disabled. Cannot carry on any self-care. Totally confined to bed or chair)  5 - Death   Eustace Pen MM, Creech RH, Tormey DC, et al. [phone]). "Toxicity and response criteria of the [facility]". [place] Oncol. 5 (6): 649-55    LABORATORY DATA:  Lab Results  Component Value Date   WBC 6.8 06/16/2019   HGB 12.4 06/16/2019   HCT 35.8 (L) 06/16/2019   MCV 87.9 06/16/2019   PLT 269.0 06/16/2019   Lab Results  Component Value Date   NA 142 10/31/2019   K 4.7 10/31/2019   CL 105 10/31/2019   CO2 25 10/31/2019   Lab Results  Component Value Date   ALT 21 08/01/2020   AST 25 08/01/2020   ALKPHOS 58 08/01/2020   BILITOT 1.2 08/01/2020      RADIOGRAPHY: [place] LT BREAST BX W LOC DEV 1ST LESION IMG BX SPEC US GUIDE  Addendum Date: 07/23/2021   ADDENDUM REPORT: 07/23/2021 15:31 ADDENDUM: Pathology revealed GRADE 3 INVASIVE POORLY DIFFERENTIATED DUCTAL ADENOCARCINOMA of the LEFT breast, 3 o'clock (ribbon clip). This was found to be concordant by Dr. Franki Cabot. Pathology results were discussed with the patient by telephone. The patient reported doing well after the biopsy with tenderness at the site. Post biopsy instructions and care were reviewed and questions were answered. The patient was encouraged to call [facility] for any additional concerns. The patient was referred to [facility] on July 31, 2021. Pathology results reported by Stacie Acres RN on 07/23/2021. Electronically Signed   By: Franki Cabot M.D.   On: 07/23/2021 15:31   Result Date: 07/23/2021 CLINICAL DATA:  Patient with a suspicious mass in the LEFT breast at the 3 o'clock axis presents today for ultrasound-guided core biopsy. EXAM: ULTRASOUND GUIDED LEFT BREAST CORE NEEDLE BIOPSY COMPARISON:  Previous exams. PROCEDURE: I met with the patient and we discussed the procedure of ultrasound-guided biopsy, including benefits and alternatives. We discussed the high likelihood of a successful procedure. We discussed the risks of the procedure, including infection, bleeding, tissue injury, clip migration, and inadequate sampling. Informed written consent was given. The usual time-out protocol was performed immediately prior to the procedure. Lesion  quadrant: 3 o'clock Using sterile technique and 1% Lidocaine as local anesthetic, under direct ultrasound visualization, a 12 gauge spring-loaded device was used to perform biopsy of the LEFT breast mass at the 3 o'clock axis using a lateral approach. At the conclusion of the procedure ribbon shaped tissue marker clip was deployed into the biopsy cavity. Follow up 2 view mammogram was performed and dictated separately. IMPRESSION: Ultrasound guided biopsy of the LEFT breast mass at the 3 o'clock axis. No apparent complications. Electronically Signed: By: Franki Cabot M.D. On: 07/22/2021 08:12  MM CLIP PLACEMENT LEFT  Result Date: 07/22/2021 CLINICAL DATA:  Status post ultrasound-guided biopsy for a LEFT breast mass at the 3 o'clock axis. EXAM: 3D DIAGNOSTIC LEFT MAMMOGRAM POST ULTRASOUND BIOPSY COMPARISON:  Previous exam(s). FINDINGS: 3D Mammographic images were obtained following ultrasound guided biopsy of the LEFT breast mass at the 3 o'clock axis. The biopsy marking clip is in expected position at the site of biopsy. IMPRESSION: Appropriate positioning of the ribbon shaped  biopsy marking clip at the site of biopsy in the outer LEFT breast corresponding to the targeted mass at the 3 o'clock axis. Final Assessment: Post Procedure Mammograms for Marker Placement Electronically Signed   By: Franki Cabot M.D.   On: 07/22/2021 08:33  MM DIAG BREAST TOMO UNI LEFT  Result Date: 07/17/2021 CLINICAL DATA:  The patient was called back for a left breast mass EXAM: DIGITAL DIAGNOSTIC UNILATERAL LEFT MAMMOGRAM WITH TOMOSYNTHESIS AND CAD; ULTRASOUND LEFT BREAST LIMITED TECHNIQUE: Left digital diagnostic mammography and breast tomosynthesis was performed. The images were evaluated with computer-aided detection.; Targeted ultrasound examination of the left breast was performed. COMPARISON:  Previous exam(s). ACR Breast Density Category b: There are scattered areas of fibroglandular density. FINDINGS: There is a 7 mm irregular mass in the lateral left breast at a mid depth. On physical exam, no suspicious lumps are identified. Targeted ultrasound is performed, showing an irregular centrally hypoechoic mass with an echogenic rim measuring 7 x 5 by 7 mm, thought to correlate with the mammographically identified mass. This mass is located at 3 o'clock, 4 cm from the nipple. No axillary adenopathy. IMPRESSION: Suspicious left breast mass at 3 o'clock, 4 cm from the nipple. No axillary adenopathy. RECOMMENDATION: Ultrasound-guided biopsy of the left breast mass. I have discussed the findings and recommendations with the patient. If applicable, a reminder letter will be sent to the patient regarding the next appointment. BI-RADS CATEGORY  4: Suspicious. Electronically Signed   By: Dorise Bullion III M.D.   On: 07/17/2021 09:19  US BREAST LTD UNI LEFT INC AXILLA  Result Date: 07/17/2021 CLINICAL DATA:  The patient was called back for a left breast mass EXAM: DIGITAL DIAGNOSTIC UNILATERAL LEFT MAMMOGRAM WITH TOMOSYNTHESIS AND CAD; ULTRASOUND LEFT BREAST LIMITED TECHNIQUE: Left digital diagnostic  mammography and breast tomosynthesis was performed. The images were evaluated with computer-aided detection.; Targeted ultrasound examination of the left breast was performed. COMPARISON:  Previous exam(s). ACR Breast Density Category b: There are scattered areas of fibroglandular density. FINDINGS: There is a 7 mm irregular mass in the lateral left breast at a mid depth. On physical exam, no suspicious lumps are identified. Targeted ultrasound is performed, showing an irregular centrally hypoechoic mass with an echogenic rim measuring 7 x 5 by 7 mm, thought to correlate with the mammographically identified mass. This mass is located at 3 o'clock, 4 cm from the nipple. No axillary adenopathy. IMPRESSION: Suspicious left breast mass at 3 o'clock, 4 cm from the nipple.  No axillary adenopathy. RECOMMENDATION: Ultrasound-guided biopsy of the left breast mass. I have discussed the findings and recommendations with the patient. If applicable, a reminder letter will be sent to the patient regarding the next appointment. BI-RADS CATEGORY  4: Suspicious. Electronically Signed   By: Dorise Bullion III M.D.   On: 07/17/2021 09:19      IMPRESSION/PLAN: 1. 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 Dr. Lisbeth Renshaw discusses the pathology findings and reviews the nature of early stage breast disease. The consensus from the breast conference includes breast conservation with lumpectomy with sentinel node biopsy. Dr. Chryl Heck recommends adjuvant chemotherapy. Following chemotherapy< Dr. Lisbeth Renshaw recommends external radiotherapy to the breast  to reduce risks of local recurrence followed by antiestrogen therapy. We discussed the risks, benefits, short, and long term effects of radiotherapy, as well as the curative intent, and the patient is interested in proceeding. Dr. Lisbeth Renshaw discusses the delivery and logistics of radiotherapy and anticipates a course of 4 or up to 6 1/2 weeks of  radiotherapy. We will see her back a few weeks after completing chemotherapy to discuss the simulation process and anticipate we starting radiotherapy about 4-6 weeks after surgery.  2. Possible genetic predisposition to malignancy. The patient is a candidate for genetic testing given  personal history. She was offered referral and will meet genetics today.   In a visit lasting 60 minutes, greater than 50% of the time was spent face to face reviewing her case, as well as in preparation of, discussing, and coordinating the patient's care.  The above documentation reflects my direct findings during this shared patient visit. Please see the separate note by Dr. Lisbeth Renshaw on this date for the remainder of the patient's plan of care.    Carola Rhine, [facility]    **Disclaimer: This note was dictated with voice recognition software. Similar sounding words can inadvertently be transcribed and this note may contain transcription errors which may not have been corrected upon publication of note.**

## 2021-07-31 ENCOUNTER — Ambulatory Visit: Payer: No Typology Code available for payment source | Attending: General Surgery | Admitting: Physical Therapy

## 2021-07-31 ENCOUNTER — Encounter: Payer: Self-pay | Admitting: Genetic Counselor

## 2021-07-31 ENCOUNTER — Encounter: Payer: Self-pay | Admitting: Hematology and Oncology

## 2021-07-31 ENCOUNTER — Ambulatory Visit
Admission: RE | Admit: 2021-07-31 | Discharge: 2021-07-31 | Disposition: A | Discharge status: Home / Self Care | Payer: No Typology Code available for payment source | Source: Ambulatory Visit | Attending: Radiation Oncology | Admitting: Radiation Oncology

## 2021-07-31 ENCOUNTER — Encounter: Payer: Self-pay | Admitting: *Deleted

## 2021-07-31 ENCOUNTER — Ambulatory Visit: Payer: Self-pay | Admitting: General Surgery

## 2021-07-31 ENCOUNTER — Inpatient Hospital Stay: Payer: No Typology Code available for payment source

## 2021-07-31 ENCOUNTER — Encounter: Payer: Self-pay | Admitting: Physical Therapy

## 2021-07-31 ENCOUNTER — Inpatient Hospital Stay (HOSPITAL_BASED_OUTPATIENT_CLINIC_OR_DEPARTMENT_OTHER): Payer: No Typology Code available for payment source | Admitting: Genetic Counselor

## 2021-07-31 ENCOUNTER — Inpatient Hospital Stay
Payer: No Typology Code available for payment source | Attending: Hematology and Oncology | Admitting: Hematology and Oncology

## 2021-07-31 ENCOUNTER — Other Ambulatory Visit: Payer: Self-pay

## 2021-07-31 DIAGNOSIS — H9192 Unspecified hearing loss, left ear: Secondary | ICD-10-CM | POA: Insufficient documentation

## 2021-07-31 DIAGNOSIS — Z803 Family history of malignant neoplasm of breast: Secondary | ICD-10-CM | POA: Insufficient documentation

## 2021-07-31 DIAGNOSIS — Z170 Estrogen receptor positive status [ER+]: Secondary | ICD-10-CM

## 2021-07-31 DIAGNOSIS — C50412 Malignant neoplasm of upper-outer quadrant of left female breast: Secondary | ICD-10-CM | POA: Insufficient documentation

## 2021-07-31 DIAGNOSIS — Z79899 Other long term (current) drug therapy: Secondary | ICD-10-CM | POA: Insufficient documentation

## 2021-07-31 DIAGNOSIS — R233 Spontaneous ecchymoses: Secondary | ICD-10-CM | POA: Insufficient documentation

## 2021-07-31 DIAGNOSIS — I251 Atherosclerotic heart disease 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: Secondary | ICD-10-CM | POA: Insufficient documentation

## 2021-07-31 DIAGNOSIS — Z7982 Long term (current) use of aspirin: Secondary | ICD-10-CM | POA: Diagnosis not present

## 2021-07-31 DIAGNOSIS — D699 Hemorrhagic condition, unspecified: Secondary | ICD-10-CM | POA: Insufficient documentation

## 2021-07-31 DIAGNOSIS — G4700 Insomnia, unspecified: Secondary | ICD-10-CM | POA: Insufficient documentation

## 2021-07-31 DIAGNOSIS — Z808 Family history of malignant neoplasm of other organs or systems: Secondary | ICD-10-CM | POA: Diagnosis not present

## 2021-07-31 DIAGNOSIS — R293 Abnormal posture: Secondary | ICD-10-CM

## 2021-07-31 DIAGNOSIS — E782 Mixed hyperlipidemia: Secondary | ICD-10-CM | POA: Insufficient documentation

## 2021-07-31 DIAGNOSIS — Z801 Family history of malignant neoplasm of trachea, bronchus and lung: Secondary | ICD-10-CM

## 2021-07-31 DIAGNOSIS — I10 Essential (primary) hypertension: Secondary | ICD-10-CM | POA: Insufficient documentation

## 2021-07-31 DIAGNOSIS — E559 Vitamin D deficiency, unspecified: Secondary | ICD-10-CM | POA: Insufficient documentation

## 2021-07-31 DIAGNOSIS — G479 Sleep disorder, unspecified: Secondary | ICD-10-CM | POA: Insufficient documentation

## 2021-07-31 HISTORY — DX: Family history of malignant neoplasm of breast: Z80.3

## 2021-07-31 LAB — CMP (CANCER CENTER ONLY)
ALT: 22 U/L (ref 0–44)
AST: 21 U/L (ref 15–41)
Albumin: 4.4 g/dL (ref 3.5–5.0)
Alkaline Phosphatase: 45 U/L (ref 38–126)
Anion gap: 5 (ref 5–15)
BUN: 15 mg/dL (ref 6–20)
CO2: 28 mmol/L (ref 22–32)
Calcium: 9.3 mg/dL (ref 8.9–10.3)
Chloride: 105 mmol/L (ref 98–111)
Creatinine: 0.68 mg/dL (ref 0.44–1.00)
GFR, Estimated: >60 mL/min (ref >60)
Glucose, Bld: 105 mg/dL — ABNORMAL HIGH (ref 70–99)
Potassium: 4 mmol/L (ref 3.5–5.1)
Sodium: 138 mmol/L (ref 135–145)
Total Bilirubin: 0.9 mg/dL (ref 0.3–1.2)
Total Protein: 6.9 g/dL (ref 6.5–8.1)

## 2021-07-31 LAB — CBC WITH DIFFERENTIAL (CANCER CENTER ONLY)
Abs Immature Granulocytes: 0.01 10*3/uL (ref 0.00–0.07)
Basophils Absolute: 0 10*3/uL (ref 0.0–0.1)
Basophils Relative: 1 %
Eosinophils Absolute: 0.3 10*3/uL (ref 0.0–0.5)
Eosinophils Relative: 6 %
HCT: 36.3 % (ref 36.0–46.0)
Hemoglobin: 12.4 g/dL (ref 12.0–15.0)
Immature Granulocytes: 0 %
Lymphocytes Relative: 25 %
Lymphs Abs: 1.3 10*3/uL (ref 0.7–4.0)
MCH: 30.5 pg (ref 26.0–34.0)
MCHC: 34.2 g/dL (ref 30.0–36.0)
MCV: 89.4 fL (ref 80.0–100.0)
Monocytes Absolute: 0.4 10*3/uL (ref 0.1–1.0)
Monocytes Relative: 8 %
Neutro Abs: 3.2 10*3/uL (ref 1.7–7.7)
Neutrophils Relative %: 60 %
Platelet Count: 246 10*3/uL (ref 150–400)
RBC: 4.06 MIL/uL (ref 3.87–5.11)
RDW: 12.1 % (ref 11.5–15.5)
WBC Count: 5.3 10*3/uL (ref 4.0–10.5)
nRBC: 0 % (ref 0.0–0.2)

## 2021-07-31 LAB — GENETIC SCREENING ORDER

## 2021-07-31 NOTE — Assessment & Plan Note (Addendum)
This is a very pleasant 61 year old postmenopausal female patient with newly diagnosed left breast invasive ductal carcinoma, grade 3, ER 100% positive strong staining PR 0% negative, Ki-67 of 35% and HER2 positive by FISH referred to breast [place] for additional recommendations.  We have reviewed her imaging, pathology results in the breast [place] in detail today.  Given small size although HER2 amplified invasive ductal carcinoma, we have discussed to proceed with upfront surgery followed by consideration for adjuvant Taxol with Herceptin. We have discussed about the chemotherapy schedule, mechanism of action, adverse effects of chemotherapy including but not limited to fatigue, nausea, vomiting, diarrhea, increased risk of infection, cardiotoxicity, alopecia, neuropathy etc. she is not interested in cold cap.  She understands that some of the side effect life-threatening and permanent.  We will need a port to administer chemotherapy.  This can be placed at the time of surgery.  After completion of adjuvant chemotherapy, she will be referred to radiation and for consideration of adjuvant radiation.  Following radiation, she will start antiestrogen therapy.  Thank you for consulting on the care of this patient.  Please not hesitate to contact us with any additional questions or concerns.

## 2021-07-31 NOTE — Progress Notes (Signed)
REFERRING PROVIDER: Benay Pike, MD [place],  [place] [address]  PRIMARY PROVIDER:  [name], MD  PRIMARY REASON FOR VISIT:  1. Malignant neoplasm of upper-outer quadrant of left breast in female, estrogen receptor positive ([place])   2. Family history of breast cancer     HISTORY OF PRESENT ILLNESS:   Ms. Lampkins, a 61 y.o. female, was seen for a [place] cancer genetics consultation during the breast multidisciplinary clinic at the request of Dr. Chryl Heck due to a personal and family history of breast cancer.  Ms. Boothe presents to clinic today to discuss the possibility of a hereditary predisposition to cancer, to discuss genetic testing, and to further clarify her future cancer risks, as well as potential cancer risks for family members.   In June 2023, at the age of 52, Ms. Yoon was diagnosed with invasive ductal carcinoma (ER+/PR-/HER2+) of the left breast. The preliminary treatment plan includes surgery, adjuvant chemotherapy, consideration for adjuvant radiation, and anti-estrogens.   CANCER HISTORY:  Oncology History  Malignant neoplasm of upper-outer quadrant of left breast in female, estrogen receptor positive ([place])  07/17/2021 Mammogram   Suspicious left breast mass at 3 o'clock, 4 cm from the nipple. No axillary adenopathy. Targeted ultrasound is performed, showing an irregular centrally hypoechoic mass with an echogenic rim measuring 7 x 5 by 7 mm, thought to correlate with the mammographically identified mass. This mass is located at 3 o'clock, 4 cm from the nipple. No axillary adenopathy.   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female, estrogen receptor positive ([place])   07/29/2021 Cancer Staging   Staging form: Breast, AJCC 8th Edition - Clinical stage from 07/29/2021: Stage IA (cT1c, cN0, cM0, G3, ER+, PR-, HER2+) - Signed by Hayden Pedro, PA-C on 07/31/2021 Stage prefix: Initial  diagnosis Method of lymph node assessment: Clinical Histologic grading system: 3 grade system    Pathology Results   Path showed IDC, grade 3, ER 100% positive, strong staining intensity, PR negative, ki 67 35%, Her 2 positive      RISK FACTORS:  Colonoscopy: yes;  most recent in 2012 . Up to date with pelvic exams: PAP in 2022. Menarche was at age 70.  First live birth at age 59.  Menopausal status: postmenopausal.  OCP use for approximately 10 years.  HRT use: 0 years.  Past Medical History:  Diagnosis Date   Family history of breast cancer 07/31/2021   Hypertension     Past Surgical History:  Procedure Laterality Date   CESAREAN SECTION     FOOT SURGERY Right      FAMILY HISTORY:  We obtained a detailed, 4-generation family history.  Significant diagnoses are listed below: Family History  Problem Relation Age of Onset   Heart disease Father    Breast cancer Sister 24   Esophageal cancer Maternal Uncle        dx after age 75   Lung cancer Paternal Uncle    Skin cancer Paternal Uncle     Ms. Santori is unaware of previous family history of genetic testing for hereditary cancer risks.  There is no reported Ashkenazi Jewish ancestry. Ms. Siegenthaler stated that her parents may be distantly related but are separated by more than three degrees of relation.   GENETIC COUNSELING ASSESSMENT: Ms. Kasal is a 61 y.o. female with a personal and family history of cancer which is somewhat suggestive of a hereditary cancer syndrome and predisposition to cancer  given the presence of related cancers in the family and the ages of diagnosis. We, therefore, discussed and recommended the following at today's visit.   DISCUSSION: We discussed that 5 - 10% of cancer is hereditary, with most cases of hereditary breast cancer associated with mutations in BRCA1/2.  There are other genes that can be associated with hereditary breast cancer syndromes.  Type of cancer risk and level of risk are  gene-specific. We discussed that testing is beneficial for several reasons including knowing how to follow individuals after completing their treatment, identifying whether potential treatment options would be beneficial, and understanding if other family members could be at risk for cancer and allowing them to undergo genetic testing.   We reviewed the characteristics, features and inheritance patterns of hereditary cancer syndromes. We also discussed genetic testing, including the appropriate family members to test, the process of testing, insurance coverage and turn-around-time for results. We discussed the implications of a negative, positive and/or variant of uncertain significant result. In order to get genetic test results in a timely manner so that Ms. Williamsen can use these genetic test results for surgical decisions, we recommended Ms. Humber pursue genetic testing for the [place] BRCAPlus Panel.  The BRCAplus panel offered by [employer] and includes sequencing and deletion/duplication analysis for the following 8 genes: ATM, BRCA1, BRCA2, CDH1, CHEK2, PALB2, PTEN, and TP53. Once complete, we recommend Ms. Christian pursue reflex genetic testing to a more comprehensive gene panel.   The CustomNext-Cancer+RNAinsight panel offered by [facility] includes sequencing and rearrangement analysis for the following 47 genes:  APC, ATM, AXIN2, BARD1, BMPR1A, BRCA1, BRCA2, BRIP1, CDH1, CDK4, CDKN2A, CHEK2, DICER1, EPCAM, GREM1, HOXB13, MEN1, MLH1, MSH2, MSH3, MSH6, MUTYH, NBN, NF1, NF2, NTHL1, PALB2, PMS2, POLD1, POLE, PTEN, RAD51C, RAD51D, RECQL, RET, SDHA, SDHAF2, SDHB, SDHC, SDHD, SMAD4, SMARCA4, STK11, TP53, TSC1, TSC2, and VHL.  RNA data is routinely analyzed for use in variant interpretation for all genes.  Based on Ms. Bourke's personal and family history of breast cancer, she meets medical criteria for genetic testing. Despite that she meets criteria, she may still have an out of pocket cost. We  discussed that if her out of pocket cost for testing is over $100, the laboratory should contact them to discuss self-pay prices, patient pay assistance programs, if applicable, and other billing options.   PLAN: After considering the risks, benefits, and limitations, Ms. Cassells provided informed consent to pursue genetic testing and the blood sample was sent to [facility] for analysis of the BRCAPlus and CustomNext-Cancer +RNAinsight Panel. Results should be available within approximately 1-2 weeks' time, at which point they will be disclosed by telephone to Ms. Boyers, as will any additional recommendations warranted by these results. Ms. Haertel will receive a summary of her genetic counseling visit and a copy of her results once available. This information will also be available in Epic.   Based on Ms. Smyre's family history, we also recommended her sister, who was diagnosed with breast cancer at age 4, have genetic counseling and updated genetic testing. Ms. Shadix will let us know if we can be of any assistance in coordinating genetic counseling and/or testing for this family member.   Ms. Fazzini questions were answered to her satisfaction today. Our contact information was provided should additional questions or concerns arise. Thank you for the referral and allowing [place] to share in the care of your patient.   Tarvaris Puglia M. Joette Catching, [place], [facility] Genetic Counselor [email or web address] (P) [phone]  The patient was  seen for a total of 15 minutes in face-to-face genetic counseling.  Patient was accompanied by her husband, Mikki Santee.  Drs. Lindi Adie and/or Burr Medico were available to discuss this case as needed.  _______________________________________________________________________ For Office Staff:  Number of people involved in session: 2 Was an Intern/ student involved with case: no

## 2021-07-31 NOTE — Progress Notes (Signed)
This is a 61 year old postmenopausal female patient with newly diagnosed left breast invasive ductal carcinoma [place] NOTE  Patient Care Team: [name], MD as PCP - General (Internal Medicine) Buford Dresser, MD as PCP - Cardiology (Cardiology) Rockwell Germany, RN as Oncology Nurse Navigator Mauro Kaufmann, RN as Oncology Nurse Navigator Jovita Kussmaul, MD as Consulting Physician (General Surgery) Benay Pike, MD as Consulting Physician (Hematology and Oncology) Kyung Rudd, MD as Consulting Physician (Radiation Oncology)  CHIEF COMPLAINTS/PURPOSE OF CONSULTATION:  Newly diagnosed breast cancer  HISTORY OF PRESENTING ILLNESS:  AAMORI MCMASTERS 61 y.o. female is here because of recent diagnosis of left breast cancer  I reviewed her records extensively and collaborated the history with the patient.  SUMMARY OF ONCOLOGIC HISTORY: Oncology History  Malignant neoplasm of upper-outer quadrant of left breast in female, estrogen receptor positive ([place])  07/17/2021 Mammogram   Suspicious left breast mass at 3 o'clock, 4 cm from the nipple. No axillary adenopathy. Targeted ultrasound is performed, showing an irregular centrally hypoechoic mass with an echogenic rim measuring 7 x 5 by 7 mm, thought to correlate with the mammographically identified mass. This mass is located at 3 o'clock, 4 cm from the nipple. No axillary adenopathy.   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female, estrogen receptor positive ([place])   07/29/2021 Cancer Staging   Staging form: Breast, AJCC 8th Edition - Clinical stage from 07/29/2021: Stage IA (cT1c, cN0, cM0, G3, ER+, PR-, HER2+) - Signed by Hayden Pedro, PA-C on 07/31/2021 Stage prefix: Initial diagnosis Method of lymph node assessment: Clinical Histologic grading system: 3 grade system    Pathology Results   Path showed IDC, grade 3, ER 100%  positive, strong staining intensity, PR negative, ki 67 35%, Her 2 positive    She is here for follow-up with her has been.  She is a healthy 61 year old with no major medical comorbidities.  She exercises regularly.  Family history significant for sister with breast cancer at the age of 13.  She has 2 kids, one of them is a Engineer, water and the second 1 is a Electrical engineer.  She used birth control from 67 through 2010. Rest of the pertinent 10 point ROS reviewed and negative   MEDICAL HISTORY:  Past Medical History:  Diagnosis Date   Hypertension     SURGICAL HISTORY: Past Surgical History:  Procedure Laterality Date   CESAREAN SECTION     FOOT SURGERY Right     SOCIAL HISTORY: Social History   Socioeconomic History   Marital status: Married    Spouse name: Not on file   Number of children: Not on file   Years of education: Not on file   Highest education level: Not on file  Occupational History   Not on file  Tobacco Use   Smoking status: Never   Smokeless tobacco: Never  Vaping Use   Vaping Use: Never used  Substance and Sexual Activity   Alcohol use: Yes    Comment: once weekly   Drug use: Never   Sexual activity: Not on file  Other Topics Concern   Not on file  Social History Narrative   Not on file   Social Determinants of Health   Financial Resource Strain: Not on file  Food Insecurity: Not on file  Transportation Needs: Not on file  Physical Activity: Not on file  Stress: Not on file  Social Connections: Not on file  Intimate Partner Violence: Not on file    FAMILY HISTORY: Family History  Problem Relation Age of Onset   Heart disease Father    Breast cancer Sister     ALLERGIES:  is allergic to lisinopril and mefloquine.  MEDICATIONS:  Current Outpatient Medications  Medication Sig Dispense Refill   aspirin EC 81 MG tablet Take 81 mg by mouth daily. Swallow whole.     omeprazole (PRILOSEC) 40 MG capsule Take 40 mg by mouth daily as  needed.     rosuvastatin (CRESTOR) 40 MG tablet TAKE 1 TABLET BY MOUTH EVERY DAY 90 tablet 1   valsartan-hydrochlorothiazide (DIOVAN-HCT) 80-12.5 MG tablet Take 1 tablet by mouth daily. 90 tablet 3   Bacillus Coagulans-Inulin (PROBIOTIC) 1-250 BILLION-MG CAPS Take by mouth daily.      Magnesium 100 MG CAPS Take by mouth daily. Pt takes a couple times a week     nitroGLYCERIN (NITROSTAT) 0.4 MG SL tablet Place 1 tablet (0.4 mg total) under the tongue every 5 (five) minutes as needed for chest pain. 90 tablet 3   zolpidem (AMBIEN) 10 MG tablet Take 5-10 mg by mouth at bedtime as needed.     No current facility-administered medications for this visit.    REVIEW OF SYSTEMS:   Constitutional: Denies fevers, chills or abnormal night sweats Eyes: Denies blurriness of vision, double vision or watery eyes Ears, nose, mouth, throat, and face: Denies mucositis or sore throat Respiratory: Denies cough, dyspnea or wheezes Cardiovascular: Denies palpitation, chest discomfort or lower extremity swelling Gastrointestinal:  Denies nausea, heartburn or change in bowel habits Skin: Denies abnormal skin rashes Lymphatics: Denies new lymphadenopathy or easy bruising Neurological:Denies numbness, tingling or new weaknesses Behavioral/Psych: Mood is stable, no new changes  Breast: Denies any palpable lumps or discharge All other systems were reviewed with the patient and are negative.  PHYSICAL EXAMINATION: ECOG PERFORMANCE STATUS: 0 - Asymptomatic  Vitals:   07/31/21 0854  BP: (!) 138/57  Pulse: (!) 57  Resp: 18  Temp: 98 F (36.7 C)  SpO2: 100%   Filed Weights   07/31/21 0854  Weight: 138 lb 6.4 oz (62.8 kg)    GENERAL:alert, no distress and comfortable LYMPH:  no palpable lymphadenopathy in the cervical, axillary LUNGS: clear to auscultation and percussion with normal breathing effort HEART: regular rate & rhythm and no murmurs and no lower extremity edema PSYCH: alert & oriented x 3 with  fluent speech NEURO: no focal motor/sensory deficits BREAST:palpable nodule left breast lower outer quadrant, size hard to estimate given  No regional adenopathy.   LABORATORY DATA:  I have reviewed the data as listed Lab Results  Component Value Date   WBC 5.3 07/31/2021   HGB 12.4 07/31/2021   HCT 36.3 07/31/2021   MCV 89.4 07/31/2021   PLT 246 07/31/2021   Lab Results  Component Value Date   NA 138 07/31/2021   K 4.0 07/31/2021   CL 105 07/31/2021   CO2 28 07/31/2021    RADIOGRAPHIC STUDIES: I have personally reviewed the radiological reports and agreed with the findings in the report.  ASSESSMENT AND PLAN:  Malignant neoplasm of upper-outer quadrant of left breast in female, estrogen receptor positive ([place]) This is a very pleasant 61 year old postmenopausal female patient with newly diagnosed left breast invasive ductal carcinoma, grade 3, ER 100% positive strong staining PR 0% negative, Ki-67 of 35% and HER2 positive by FISH referred to breast [place] for additional recommendations.  We have reviewed her imaging, pathology results in  the breast [place] in detail today.  Given small size although HER2 amplified invasive ductal carcinoma, we have discussed to proceed with upfront surgery followed by consideration for adjuvant Taxol with Herceptin. We have discussed about the chemotherapy schedule, mechanism of action, adverse effects of chemotherapy including but not limited to fatigue, nausea, vomiting, diarrhea, increased risk of infection, cardiotoxicity, alopecia, neuropathy etc. she is not interested in cold cap.  She understands that some of the side effect life-threatening and permanent.  We will need a port to administer chemotherapy.  This can be placed at the time of surgery.  After completion of adjuvant chemotherapy, she will be referred to radiation and for consideration of adjuvant radiation.  Following radiation, she will start antiestrogen therapy.  Thank you for  consulting on the care of this patient.  Please not hesitate to contact us with any additional questions or concerns.   All questions were answered. The patient knows to call the clinic with any problems, questions or concerns.    Benay Pike, MD 07/31/21

## 2021-07-31 NOTE — Addendum Note (Signed)
Encounter addended by: Hayden Pedro, PA-C on: 07/31/2021 10:43 AM  Actions taken: Clinical Note Signed

## 2021-07-31 NOTE — Therapy (Signed)
OUTPATIENT PHYSICAL THERAPY BREAST CANCER BASELINE EVALUATION   Patient Name: TAKEIA CIARAVINO MRN: [id number] DOB:01/29/1961, 61 y.o., female Today's Date: 07/31/2021   PT End of Session - 07/31/21 1024     Visit Number 1    Number of Visits 2    Date for PT Re-Evaluation 09/25/21    PT Start Time 1027    PT Stop Time 1044   Also saw pt from 1132 to 1142 for a total of 27 minutes   PT Time Calculation (min) 17 min    Activity Tolerance Patient tolerated treatment well    Behavior During Therapy [facility] for tasks assessed/performed             Past Medical History:  Diagnosis Date   Hypertension    Past Surgical History:  Procedure Laterality Date   CESAREAN SECTION     FOOT SURGERY Right    Patient Active Problem List   Diagnosis Date Noted   Malignant neoplasm of upper-outer quadrant of left breast in female, estrogen receptor positive ([place]) 07/25/2021   Chronic cough 08/31/2019   GERD (gastroesophageal reflux disease) 08/31/2019     REFERRING PROVIDER: Dr. Autumn Messing  REFERRING DIAG: Left breast cancer  THERAPY DIAG:  Malignant neoplasm of upper-outer quadrant of left breast in female, estrogen receptor positive ([place])  Abnormal posture  Rationale for Evaluation and Treatment Rehabilitation  ONSET DATE: 07/08/2021  SUBJECTIVE                                                                                                                                                                                           SUBJECTIVE STATEMENT: Patient reports she is here today to be seen by her medical team for her newly diagnosed left breast cancer.   PERTINENT HISTORY:  Patient was diagnosed on 07/08/2021 with left grade 3 invasive ductal carcinoma breast cancer. It measures 7 mm and is located in the upper outer quadrant. It is ER positive, PR negative, and HER2 positive with a Ki67 of 35%.   PATIENT GOALS   reduce lymphedema risk and learn post op HEP.   PAIN:  Are  you having pain? No   PRECAUTIONS: Active CA   HAND DOMINANCE: right  WEIGHT BEARING RESTRICTIONS No  FALLS:  Has patient fallen in last 6 months? No  LIVING ENVIRONMENT: Patient lives with: her husband Lives in: House/apartment Has following equipment at home: None  OCCUPATION: Works full time in Retail buyer for [employer] which she reports is a very active job  LEISURE: She does cardio and weights 4x/week for an hour each time  PRIOR LEVEL OF FUNCTION: Independent  OBJECTIVE  COGNITION:  Overall cognitive status: Within functional limits for tasks assessed    POSTURE:  Forward head and rounded shoulders posture  UPPER EXTREMITY AROM/PROM:  A/PROM RIGHT   eval   Shoulder extension 52  Shoulder flexion 160  Shoulder abduction 172  Shoulder internal rotation 70  Shoulder external rotation 89    (Blank rows = not tested)  A/PROM LEFT   eval  Shoulder extension 51  Shoulder flexion 152  Shoulder abduction 168  Shoulder internal rotation 70  Shoulder external rotation 90    (Blank rows = not tested)   CERVICAL AROM: All within normal limits  UPPER EXTREMITY STRENGTH: WNL   LYMPHEDEMA ASSESSMENTS:   LANDMARK RIGHT   eval  10 cm proximal to olecranon process 24  Olecranon process 22  10 cm proximal to ulnar styloid process 20  Just proximal to ulnar styloid process 13.6  Across hand at thumb web space 17.5  At base of 2nd digit 5.8  (Blank rows = not tested)  LANDMARK LEFT   eval  10 cm proximal to olecranon process 24.8  Olecranon process 21.7  10 cm proximal to ulnar styloid process 19.4  Just proximal to ulnar styloid process 13.4  Across hand at thumb web space 17.5  At base of 2nd digit 5.8  (Blank rows = not tested)   L-DEX LYMPHEDEMA SCREENING:  The patient was assessed using the L-Dex machine today to produce a lymphedema index baseline score. The patient will be reassessed on a regular basis (typically every 3 months) to obtain new  L-Dex scores. If the score is > 6.5 points away from his/her baseline score indicating onset of subclinical lymphedema, it will be recommended to wear a compression garment for 4 weeks, 12 hours per day and then be reassessed. If the score continues to be > 6.5 points from baseline at reassessment, we will initiate lymphedema treatment. Assessing in this manner has a 95% rate of preventing clinically significant lymphedema.   L-DEX FLOWSHEETS - 07/31/21 1000       L-DEX LYMPHEDEMA SCREENING   Measurement Type Unilateral    L-DEX MEASUREMENT EXTREMITY Upper Extremity    POSITION  Standing    DOMINANT SIDE Right    At Risk Side Left    BASELINE SCORE (UNILATERAL) -2.1              QUICK DASH SURVEY:  Katina Dung - 07/31/21 0001     Open a tight or new jar Mild difficulty    Do heavy household chores (wash walls, wash floors) No difficulty    Carry a shopping bag or briefcase No difficulty    Wash your back No difficulty    Use a knife to cut food No difficulty    Recreational activities in which you take some force or impact through your arm, shoulder, or hand (golf, hammering, tennis) No difficulty    During the past week, to what extent has your arm, shoulder or hand problem interfered with your normal social activities with family, friends, neighbors, or groups? Not at all    During the past week, to what extent has your arm, shoulder or hand problem limited your work or other regular daily activities Not at all    Arm, shoulder, or hand pain. None    Tingling (pins and needles) in your arm, shoulder, or hand None    Difficulty Sleeping No difficulty    DASH Score 2.27 %  PATIENT EDUCATION:  Education details: Lymphedema risk reduction and post op shoulder/posture HEP Person educated: Patient Education method: Explanation, Demonstration, Handout Education comprehension: Patient verbalized understanding and returned demonstration   HOME EXERCISE  PROGRAM: Patient was instructed today in a home exercise program today for post op shoulder range of motion. These included active assist shoulder flexion in sitting, scapular retraction, wall walking with shoulder abduction, and hands behind head external rotation.  She was encouraged to do these twice a day, holding 3 seconds and repeating 5 times when permitted by her physician.   ASSESSMENT:  CLINICAL IMPRESSION: Patient was diagnosed on 07/08/2021 with left grade 3 invasive ductal carcinoma breast cancer. It measures 7 mm and is located in the upper outer quadrant. It is ER positive, PR negative, and HER2 positive with a Ki67 of 35%. Her multidisciplinary medical team met prior to her assessments to determine a recommended treatment plan. She is planning to have a lumpectomy and sentinel node biopsy followed by chemotherapy and Herceptin, radiation, and anti-estrogen therapy. She will benefit from a post op PT reassessment to determine needs and from L-Dex screens every 3 months for 2 years to detect subclinical lymphedema.  Pt will benefit from skilled therapeutic intervention to improve on the following deficits: Decreased knowledge of precautions, impaired UE functional use, pain, decreased ROM, postural dysfunction.   PT treatment/interventions: ADL/self-care home management, pt/family education, therapeutic exercise  REHAB POTENTIAL: Excellent  CLINICAL DECISION MAKING: Stable/uncomplicated  EVALUATION COMPLEXITY: Low   GOALS: Goals reviewed with patient? YES  LONG TERM GOALS: (STG=LTG)    Name Target Date Goal status  1 Pt will be able to verbalize understanding of pertinent lymphedema risk reduction practices relevant to her dx specifically related to skin care.  Baseline:  No knowledge 07/31/2021 Achieved at eval  2 Pt will be able to return demo and/or verbalize understanding of the post op HEP related to regaining shoulder ROM. Baseline:  No knowledge 07/31/2021 Achieved at  eval  3 Pt will be able to verbalize understanding of the importance of attending the post op After Breast CA Class for further lymphedema risk reduction education and therapeutic exercise.  Baseline:  No knowledge 07/31/2021 Achieved at eval  4 Pt will demo she has regained full shoulder ROM and function post operatively compared to baselines.  Baseline: See objective measurements taken today. 09/25/2021      PLAN: PT FREQUENCY/DURATION: EVAL and 1 follow up appointment.   PLAN FOR NEXT SESSION: will reassess 3-4 weeks post op to determine needs.   Patient will follow up at outpatient cancer rehab 3-4 weeks following surgery.  If the patient requires physical therapy at that time, a specific plan will be dictated and sent to the referring physician for approval. The patient was educated today on appropriate basic range of motion exercises to begin post operatively and the importance of attending the After Breast Cancer class following surgery.  Patient was educated today on lymphedema risk reduction practices as it pertains to recommendations that will benefit the patient immediately following surgery.  She verbalized good understanding.    Physical Therapy Information for After Breast Cancer Surgery/Treatment:  Lymphedema is a swelling condition that you may be at risk for in your arm if you have lymph nodes removed from the armpit area.  After a sentinel node biopsy, the risk is approximately 5-9% and is higher after an axillary node dissection.  There is treatment available for this condition and it is not life-threatening.  Contact your physician or  physical therapist with concerns. You may begin the 4 shoulder/posture exercises (see additional sheet) when permitted by your physician (typically a week after surgery).  If you have drains, you may need to wait until those are removed before beginning range of motion exercises.  A general recommendation is to not lift your arms above shoulder  height until drains are removed.  These exercises should be done to your tolerance and gently.  This is not a "no pain/no gain" type of recovery so listen to your body and stretch into the range of motion that you can tolerate, stopping if you have pain.  If you are having immediate reconstruction, ask your plastic surgeon about doing exercises as he or she may want you to wait. We encourage you to attend the free one time ABC (After Breast Cancer) class offered by [facility].  You will learn information related to lymphedema risk, prevention and treatment and additional exercises to regain mobility following surgery.  You can call [phone] for more information.  This is offered the 1st and 3rd Monday of each month.  You only attend the class one time. While undergoing any medical procedure or treatment, try to avoid blood pressure being taken or needle sticks from occurring on the arm on the side of cancer.   This recommendation begins after surgery and continues for the rest of your life.  This may help reduce your risk of getting lymphedema (swelling in your arm). An excellent resource for those seeking information on lymphedema is the National Lymphedema Network's web site. It can be accessed at [place].org If you notice swelling in your hand, arm or breast at any time following surgery (even if it is many years from now), please contact your doctor or physical therapist to discuss this.  Lymphedema can be treated at any time but it is easier for you if it is treated early on.  If you feel like your shoulder motion is not returning to normal in a reasonable amount of time, please contact your surgeon or physical therapist.  [facility] [phone]. [address], [address], [place] [address]  ABC CLASS After Breast Cancer Class  After Breast Cancer Class is a specially designed exercise class to assist you in a safe recover after  having breast cancer surgery.  In this class you will learn how to get back to full function whether your drains were just removed or if you had surgery a month ago.  This one-time class is held the 1st and 3rd Monday of every month from 11:00 a.m. until 12:00 noon virtually.  This class is FREE and space is limited. For more information or to register for the next available class, call [phone].  Class Goals  Understand specific stretches to improve the flexibility of you chest and shoulder. Learn ways to safely strengthen your upper body and improve your posture. Understand the warning signs of infection and why you may be at risk for an arm infection. Learn about Lymphedema and prevention.  ** You do not attend this class until after surgery.  Drains must be removed to participate  Patient was instructed today in a home exercise program today for post op shoulder range of motion. These included active assist shoulder flexion in sitting, scapular retraction, wall walking with shoulder abduction, and hands behind head external rotation.  She was encouraged to do these twice a day, holding 3 seconds and repeating 5 times when permitted by  her physician.  [name], [place] 07/31/21 11:49 AM

## 2021-07-31 NOTE — Research (Signed)
Trial:  [id number] - TREATMENT OF REFRACTORY NAUSEA Patient [name] was identified by Dr Chryl Heck as a potential candidate for the above listed study.  This Clinical Research Nurse met with Christine [name], [id number], on 07/31/21 in a manner and location that ensures patient privacy to discuss participation in the above listed research study.  Patient is Accompanied by her husband .  A copy of the informed consent document and separate HIPAA Authorization was provided to the patient.  Patient reads, speaks, and understands [place].   Patient was provided with the business card of this Nurse and encouraged to contact the research team with any questions.  Approximately 15 minutes were spent with the patient reviewing the informed consent documents.  Patient was provided the option of taking informed consent documents home to review and was encouraged to review at their convenience with their support network, including other care providers. Patient took the consent documents home to review. Marjie Skiff Jeni Duling, RN, BSN, [facility] She  Her  Hers Clinical Research Nurse [facility] Direct Dial [phone]  Pager [phone] 07/31/2021 1:17 PM

## 2021-07-31 NOTE — Research (Signed)
[employer] 2021-05 - Specimen Collection Study to Evaluate Biomarkers in Subjects with Cancer   Patient Christine [name] was identified by Dr Chryl Heck as a potential candidate for the above listed study.  This Clinical Research Nurse met with MATY ZEISLER, [id number], on 07/31/21 in a manner and location that ensures patient privacy to discuss participation in the above listed research study.  Patient is Accompanied by her husband .  A copy of the informed consent document with embedded HIPAA language was provided to the patient.  Patient reads, speaks, and understands [place].   Patient was provided with the business card of this Nurse and encouraged to contact the research team with any questions.  Approximately 15 minutes were spent with the patient reviewing the informed consent documents.  Patient was provided the option of taking informed consent documents home to review and was encouraged to review at their convenience with their support network, including other care providers. Patient took the consent documents home to review.  Marjie Skiff Paxton Binns, RN, BSN, [facility] She  Her  Hers Clinical Research Nurse [facility] Direct Dial [phone]  Pager [phone] 07/31/2021 1:15 PM

## 2021-08-01 ENCOUNTER — Encounter: Payer: Self-pay | Admitting: General Practice

## 2021-08-01 NOTE — Progress Notes (Signed)
[place] Psychosocial Distress Screening Spiritual Care  Met with [place] by phone following [facility] to introduce [place] team/resources, reviewing distress screen per protocol.  The patient scored a 3 on the Psychosocial Distress Thermometer which indicates mild distress. Also assessed for distress and other psychosocial needs.    08/01/2021  ONCBCN DISTRESS SCREENING   Screening Type Initial Screening   Distress experienced in past week (1-10) 3   Family Problem type Other (comment)   Physical Problem type Sleep/insomnia;Other (comment)   Referral to support programs Yes    Ms Killman works in Teacher, music welcome and resettlement for a [employer] and reports good support from the congregation, her sister who is a breast cancer survivor, and other friends/family.  Provided empathic listening, normalization of feelings, and intro to support programming.  Follow up needed: No. Ms Avilla is aware of ongoing chaplain availability and knows to reach out whenever needed/desired.   [place], [place], [facility] Pager [phone] Voicemail [phone]

## 2021-08-05 ENCOUNTER — Encounter: Payer: Self-pay | Admitting: *Deleted

## 2021-08-05 ENCOUNTER — Encounter: Payer: Self-pay | Admitting: Genetic Counselor

## 2021-08-05 ENCOUNTER — Telehealth: Payer: Self-pay | Admitting: *Deleted

## 2021-08-05 ENCOUNTER — Other Ambulatory Visit: Payer: Self-pay | Admitting: General Surgery

## 2021-08-05 ENCOUNTER — Telehealth: Payer: Self-pay | Admitting: Genetic Counselor

## 2021-08-05 DIAGNOSIS — Z170 Estrogen receptor positive status [ER+]: Secondary | ICD-10-CM

## 2021-08-05 DIAGNOSIS — Z1379 Encounter for other screening for genetic and chromosomal anomalies: Secondary | ICD-10-CM | POA: Insufficient documentation

## 2021-08-05 NOTE — Telephone Encounter (Signed)
Revealed negative BRCAPlus Panel.  Pan-cancer panel results are pending.

## 2021-08-05 NOTE — Telephone Encounter (Signed)
Spoke with patient to follow up from [name] and assess navigation needs. Answered patient questions and encouraged her to call should she have any other questions or concerns. Patient verbalized understanding.

## 2021-08-07 ENCOUNTER — Ambulatory Visit (HOSPITAL_COMMUNITY)
Admission: RE | Admit: 2021-08-07 | Discharge: 2021-08-07 | Disposition: A | Discharge status: Home / Self Care | Payer: No Typology Code available for payment source | Source: Ambulatory Visit | Attending: Hematology and Oncology | Admitting: Hematology and Oncology

## 2021-08-07 DIAGNOSIS — Z170 Estrogen receptor positive status [ER+]: Secondary | ICD-10-CM | POA: Insufficient documentation

## 2021-08-07 DIAGNOSIS — Z5111 Encounter for antineoplastic chemotherapy: Secondary | ICD-10-CM | POA: Insufficient documentation

## 2021-08-07 DIAGNOSIS — C50412 Malignant neoplasm of upper-outer quadrant of left female breast: Secondary | ICD-10-CM | POA: Insufficient documentation

## 2021-08-07 DIAGNOSIS — Z0189 Encounter for other specified special examinations: Secondary | ICD-10-CM

## 2021-08-07 LAB — ECHOCARDIOGRAM COMPLETE
Area-P 1/2: 3.16 cm2
S' Lateral: 3 cm

## 2021-08-07 NOTE — Progress Notes (Signed)
  Echocardiogram 2D Echocardiogram has been performed.  Darlina Sicilian M 08/07/2021, 8:37 AM

## 2021-08-08 ENCOUNTER — Ambulatory Visit
Admission: RE | Admit: 2021-08-08 | Discharge: 2021-08-08 | Disposition: A | Discharge status: Home / Self Care | Payer: No Typology Code available for payment source | Source: Ambulatory Visit | Attending: General Surgery | Admitting: General Surgery

## 2021-08-08 ENCOUNTER — Inpatient Hospital Stay: Admission: RE | Admit: 2021-08-08 | Payer: No Typology Code available for payment source | Source: Ambulatory Visit

## 2021-08-08 ENCOUNTER — Other Ambulatory Visit: Payer: Self-pay

## 2021-08-08 ENCOUNTER — Other Ambulatory Visit: Payer: Self-pay | Admitting: General Surgery

## 2021-08-08 ENCOUNTER — Inpatient Hospital Stay: Payer: No Typology Code available for payment source

## 2021-08-08 ENCOUNTER — Other Ambulatory Visit: Payer: Self-pay | Admitting: Emergency Medicine

## 2021-08-08 ENCOUNTER — Inpatient Hospital Stay: Payer: No Typology Code available for payment source | Attending: Hematology and Oncology

## 2021-08-08 ENCOUNTER — Encounter (HOSPITAL_COMMUNITY): Payer: Self-pay | Admitting: General Surgery

## 2021-08-08 DIAGNOSIS — C50412 Malignant neoplasm of upper-outer quadrant of left female breast: Secondary | ICD-10-CM

## 2021-08-08 DIAGNOSIS — Z170 Estrogen receptor positive status [ER+]: Secondary | ICD-10-CM

## 2021-08-08 LAB — RESEARCH LABS

## 2021-08-08 NOTE — Research (Signed)
[employer] 2021-05 - Specimen Collection Study to Evaluate Biomarkers in Subjects with Cancer    08/08/21  2ND REVIEW:  This Coordinator has reviewed this patient's inclusion and exclusion criteria and confirmed NYJA WESTBROOK is eligible for study participation.  Patient will continue with enrollment.  Eligibility confirmed by research nurse and treating investigator, who also agrees that patient should proceed with enrollment.   Carol Ada, RT(R)(T) Clinical Research Coordinator

## 2021-08-08 NOTE — Research (Signed)
[employer] 2021-05 - Specimen Collection Study to Evaluate Biomarkers in Subjects with Cancer   ELIGIBILITY REVIEW  Eligibility: Eligibility criteria reviewed with patient. This Nurse has reviewed this patient's inclusion and exclusion criteria and confirmed patient is eligible for study participation. Eligibility confirmed by treating investigator, who also agrees that patient should proceed with enrollment. Patient will continue with enrollment.  Patient confirmed the following verbally today: - They are willing/able to comply with all study procedures. - They have not been/are not currently enrolled in another [employer] example collection study or another cohort within this study   Reviewed exclusion criteria for 'all subjects' as well as 'treatment naive cohort' with patient today who verbally denied all.  Confirmed with chart review as well.  Per MD Iruku patient has no condition that would interfere with ability to fully participate in enrollment or protocol.  ECHO completed on 08/07/21 was outside of 24 hour window required before blood collection.  Blood Collection: Research blood obtained by [facility] venipuncture per patient's preference. Patient tolerated well without any adverse events. Gift Card: $50 gift card given to patient for her participation in this study.   DATA COLLECTION  Medical History:  High Blood Pressure  Yes Coronary Artery Disease Yes Lupus    No Rheumatoid Arthritis  No Diabetes   No      If yes, which type?      N/A Lynch Syndrome  No  Is the patient currently taking a magnesium supplement?   Yes If yes, dose and frequency? 150 mg three times per week by mouth Indication? supplement Start date? 08/05/21  Does the patient have a personal history of cancer (greater than 5 years ago)?  No If yes, Cancer type and date of diagnosis?   N/A  Has this previous diagnosis been treated? N/A      If so, treatment type? N/A   Start and end dates of last  treatment cycle? N/A  Does the patient have a family history of cancer in 1st or 2nd degree relatives? Yes If yes, Relationship(s) and Cancer type(s)? Sister (breast) and paternal uncle (skin)  Does the patient have history of alcohol consumption? Yes   If yes, current or former? Current If former, year stopped? N/A Number of years? 40 years Drinks per week? 1 glass wine per week  Does the patient have history of cigarette, cigar, pipe, or chewing tobacco use?  No  If yes, current for former? N/A If yes, type (Cigarette, cigar, pipe, and/or chewing tobacco)? N/A   If former, year stopped? N/A Number of years? N/A Packs/number/containers per day? N/A  Ora 'Learta CoddingNeysa Bonito, RN, BSN Clinical Research Nurse I 08/08/21 1:35 PM

## 2021-08-08 NOTE — Research (Signed)
[employer] 2021-05 - Specimen Collection Study to Evaluate Biomarkers in Subjects with Cancer   CONSENT SIGNATURE  Patient Christine [name] was identified by MD Iruku as a potential candidate for the above listed study.  This Clinical Research Nurse met with KATHLEAN CINCO, [id number] on 08/08/21 in a manner and location that ensures patient privacy to discuss participation in the above listed research study.  Patient is Unaccompanied.  Patient was previously provided with informed consent documents.  Patient confirmed they have read the informed consent documents.  As outlined in the informed consent form, this Nurse and Verner Mould discussed the purpose of the research study, the investigational nature of the study, study procedures and requirements for study participation, potential risks and benefits of study participation, as well as alternatives to participation.  This study is not blinded or double-blinded. The patient understands participation is voluntary and they may withdraw from study participation at any time.  This study does not involve randomization.  This study does not involve an investigational drug or device. This study does not involve a placebo. Patient understands enrollment is pending full eligibility review.   Confidentiality and how the patient's information will be used as part of study participation were discussed.  Patient was informed there is reimbursement provided for their time and effort spent on trial participation.  The patient is encouraged to discuss research study participation with their insurance provider to determine what costs they may incur as part of study participation, including research related injury.    All questions were answered to patient's satisfaction.  The informed consent with embedded HIPAA language was reviewed page by page.  The patient's mental and emotional status is appropriate to provide informed consent, and the patient  verbalizes an understanding of study participation.  Patient has agreed to participate in the above listed research study and has voluntarily signed the informed consent version date 02/17/20 (rev 03/04/21) with embedded HIPAA language, version date 02/17/20 (rev 03/04/21)  on 08/08/21 at 12:35 PM.  The patient was provided with a copy of the signed informed consent form with embedded HIPAA language for their reference.  No study specific procedures were obtained prior to the signing of the informed consent document.  Approximately 30 minutes were spent with the patient reviewing the informed consent documents.  Patient was not requested to complete a Release of Information form.  [name] 'Learta CoddingNeysa Bonito, RN, BSN Clinical Research Nurse I 08/08/21 1:13 PM

## 2021-08-08 NOTE — Progress Notes (Signed)
Christine [name] denies chest pain or shortness of breath. Patient denies having any s/s of Covid in her household.  Patient denies any known exposure to Covid.   Christine [name] had a cardiac Calcium test 09/26/19- Patient level was 390, placing her in the 99th percental.  Christine [name] was tested due to family history of heart disease (father), and aortic calcifications were seen on a CT scan. Christine [name] sees Dr Buford Dresser yearly, patient was last seen 01/23/21.

## 2021-08-09 ENCOUNTER — Ambulatory Visit (HOSPITAL_COMMUNITY)
Admission: RE | Admit: 2021-08-09 | Discharge: 2021-08-09 | Disposition: A | Discharge status: Home / Self Care | Payer: No Typology Code available for payment source | Attending: General Surgery | Admitting: General Surgery

## 2021-08-09 ENCOUNTER — Ambulatory Visit (HOSPITAL_COMMUNITY): Payer: No Typology Code available for payment source | Admitting: Certified Registered"

## 2021-08-09 ENCOUNTER — Other Ambulatory Visit: Payer: Self-pay

## 2021-08-09 ENCOUNTER — Ambulatory Visit (HOSPITAL_COMMUNITY): Payer: No Typology Code available for payment source

## 2021-08-09 ENCOUNTER — Encounter (HOSPITAL_COMMUNITY)
Admission: RE | Disposition: A | Discharge status: Home / Self Care | Payer: Self-pay | Source: Home / Self Care | Attending: General Surgery

## 2021-08-09 ENCOUNTER — Ambulatory Visit
Admission: RE | Admit: 2021-08-09 | Discharge: 2021-08-09 | Disposition: A | Discharge status: Home / Self Care | Payer: No Typology Code available for payment source | Source: Ambulatory Visit | Attending: General Surgery | Admitting: General Surgery

## 2021-08-09 ENCOUNTER — Ambulatory Visit (HOSPITAL_BASED_OUTPATIENT_CLINIC_OR_DEPARTMENT_OTHER): Payer: No Typology Code available for payment source | Admitting: Certified Registered"

## 2021-08-09 ENCOUNTER — Encounter (HOSPITAL_COMMUNITY): Payer: Self-pay | Admitting: General Surgery

## 2021-08-09 ENCOUNTER — Telehealth: Payer: Self-pay | Admitting: Hematology and Oncology

## 2021-08-09 DIAGNOSIS — C50912 Malignant neoplasm of unspecified site of left female breast: Secondary | ICD-10-CM | POA: Diagnosis not present

## 2021-08-09 DIAGNOSIS — I10 Essential (primary) hypertension: Secondary | ICD-10-CM | POA: Diagnosis not present

## 2021-08-09 DIAGNOSIS — Z170 Estrogen receptor positive status [ER+]: Secondary | ICD-10-CM

## 2021-08-09 DIAGNOSIS — K219 Gastro-esophageal reflux disease without esophagitis: Secondary | ICD-10-CM | POA: Diagnosis not present

## 2021-08-09 DIAGNOSIS — Z803 Family history of malignant neoplasm of breast: Secondary | ICD-10-CM | POA: Insufficient documentation

## 2021-08-09 DIAGNOSIS — C50412 Malignant neoplasm of upper-outer quadrant of left female breast: Secondary | ICD-10-CM | POA: Diagnosis present

## 2021-08-09 DIAGNOSIS — Z79899 Other long term (current) drug therapy: Secondary | ICD-10-CM | POA: Diagnosis not present

## 2021-08-09 HISTORY — PX: BREAST LUMPECTOMY: SHX2

## 2021-08-09 HISTORY — DX: Gastro-esophageal reflux disease without esophagitis: K21.9

## 2021-08-09 HISTORY — PX: BREAST LUMPECTOMY WITH RADIOACTIVE SEED AND SENTINEL LYMPH NODE BIOPSY: SHX6550

## 2021-08-09 HISTORY — PX: PORTACATH PLACEMENT: SHX2246

## 2021-08-09 SURGERY — BREAST LUMPECTOMY WITH RADIOACTIVE SEED AND SENTINEL LYMPH NODE BIOPSY
Anesthesia: Regional | Site: Chest | Laterality: Right

## 2021-08-09 MED ORDER — HEPARIN 6000 UNIT IRRIGATION SOLUTION
Status: AC
Start: 2021-08-09 — End: ?
  Filled 2021-08-09: qty 500

## 2021-08-09 MED ORDER — MAGTRACE LYMPHATIC TRACER
INTRAMUSCULAR | Status: DC | PRN
  Administered 2021-08-09: 1 mL via INTRAMUSCULAR

## 2021-08-09 MED ORDER — CHLORHEXIDINE GLUCONATE CLOTH 2 % EX PADS: 6.0000 | MEDICATED_PAD | Freq: Once | CUTANEOUS | Status: DC

## 2021-08-09 MED ORDER — FENTANYL CITRATE (PF) 100 MCG/2ML IJ SOLN
INTRAMUSCULAR | Status: DC | PRN
  Administered 2021-08-09 (×2): 25 ug via INTRAVENOUS
  Administered 2021-08-09: 50 ug via INTRAVENOUS
  Administered 2021-08-09: 25 ug via INTRAVENOUS
  Administered 2021-08-09: 50 ug via INTRAVENOUS

## 2021-08-09 MED ORDER — BUPIVACAINE-EPINEPHRINE 0.25% -1:200000 IJ SOLN
INTRAMUSCULAR | Status: DC | PRN
  Administered 2021-08-09: 13 mL
  Administered 2021-08-09: 5 mL

## 2021-08-09 MED ORDER — GABAPENTIN 300 MG PO CAPS
300.0000 mg | ORAL_CAPSULE | ORAL | Status: AC
  Administered 2021-08-09: 300 mg via ORAL
  Filled 2021-08-09: qty 1

## 2021-08-09 MED ORDER — DEXAMETHASONE SODIUM PHOSPHATE 4 MG/ML IJ SOLN
INTRAMUSCULAR | Status: DC | PRN
  Administered 2021-08-09: 8 mg via INTRAVENOUS

## 2021-08-09 MED ORDER — PROPOFOL 500 MG/50ML IV EMUL
INTRAVENOUS | Status: DC | PRN
  Administered 2021-08-09: 150 ug/kg/min via INTRAVENOUS

## 2021-08-09 MED ORDER — HEPARIN SOD (PORK) LOCK FLUSH 100 UNIT/ML IV SOLN
INTRAVENOUS | Status: DC | PRN
  Administered 2021-08-09: 500 [IU]

## 2021-08-09 MED ORDER — PROPOFOL 10 MG/ML IV BOLUS
INTRAVENOUS | Status: AC
  Filled 2021-08-09: qty 20

## 2021-08-09 MED ORDER — ACETAMINOPHEN 500 MG PO TABS
1000.0000 mg | ORAL_TABLET | ORAL | Status: AC
  Administered 2021-08-09: 1000 mg via ORAL
  Filled 2021-08-09: qty 2

## 2021-08-09 MED ORDER — LACTATED RINGERS IV SOLN
INTRAVENOUS | Status: DC
Start: 2021-08-09 — End: 2021-08-09

## 2021-08-09 MED ORDER — PROPOFOL 10 MG/ML IV BOLUS
INTRAVENOUS | Status: DC | PRN
  Administered 2021-08-09: 20 mg via INTRAVENOUS
  Administered 2021-08-09: 200 mg via INTRAVENOUS

## 2021-08-09 MED ORDER — CEFAZOLIN SODIUM-DEXTROSE 2-4 GM/100ML-% IV SOLN
2.0000 g | INTRAVENOUS | Status: AC
  Administered 2021-08-09: 2 g via INTRAVENOUS
  Filled 2021-08-09: qty 100

## 2021-08-09 MED ORDER — MIDAZOLAM HCL 5 MG/5ML IJ SOLN
INTRAMUSCULAR | Status: DC | PRN
  Administered 2021-08-09: 2 mg via INTRAVENOUS

## 2021-08-09 MED ORDER — BUPIVACAINE-EPINEPHRINE (PF) 0.25% -1:200000 IJ SOLN
INTRAMUSCULAR | Status: AC
  Filled 2021-08-09: qty 30

## 2021-08-09 MED ORDER — BUPIVACAINE-EPINEPHRINE (PF) 0.5% -1:200000 IJ SOLN
INTRAMUSCULAR | Status: DC | PRN
  Administered 2021-08-09: 25 mL via PERINEURAL

## 2021-08-09 MED ORDER — ONDANSETRON HCL 4 MG/2ML IJ SOLN
INTRAMUSCULAR | Status: DC | PRN
  Administered 2021-08-09: 4 mg via INTRAVENOUS

## 2021-08-09 MED ORDER — PROPOFOL 1000 MG/100ML IV EMUL
INTRAVENOUS | Status: AC
Start: 2021-08-09 — End: ?
  Filled 2021-08-09: qty 100

## 2021-08-09 MED ORDER — DEXAMETHASONE SODIUM PHOSPHATE 10 MG/ML IJ SOLN
INTRAMUSCULAR | Status: AC
Start: 2021-08-09 — End: ?
  Filled 2021-08-09: qty 1

## 2021-08-09 MED ORDER — ONDANSETRON HCL 4 MG/2ML IJ SOLN
INTRAMUSCULAR | Status: AC
  Filled 2021-08-09: qty 2

## 2021-08-09 MED ORDER — ORAL CARE MOUTH RINSE: 15.0000 mL | Freq: Once | OROMUCOSAL | Status: AC

## 2021-08-09 MED ORDER — MIDAZOLAM HCL 2 MG/2ML IJ SOLN
INTRAMUSCULAR | Status: AC
Start: 2021-08-09 — End: ?
  Filled 2021-08-09: qty 2

## 2021-08-09 MED ORDER — LIDOCAINE 2% (20 MG/ML) 5 ML SYRINGE
INTRAMUSCULAR | Status: DC | PRN
  Administered 2021-08-09: 60 mg via INTRAVENOUS

## 2021-08-09 MED ORDER — CELECOXIB 200 MG PO CAPS
200.0000 mg | ORAL_CAPSULE | ORAL | Status: AC
  Administered 2021-08-09: 200 mg via ORAL
  Filled 2021-08-09: qty 1

## 2021-08-09 MED ORDER — METHYLENE BLUE 1 % INJ SOLN
INTRAVENOUS | Status: AC
  Filled 2021-08-09: qty 10

## 2021-08-09 MED ORDER — FENTANYL CITRATE (PF) 250 MCG/5ML IJ SOLN
INTRAMUSCULAR | Status: AC
  Filled 2021-08-09: qty 5

## 2021-08-09 MED ORDER — EPHEDRINE SULFATE-NACL 50-0.9 MG/10ML-% IV SOSY
PREFILLED_SYRINGE | INTRAVENOUS | Status: DC | PRN
  Administered 2021-08-09: 5 mg via INTRAVENOUS
  Administered 2021-08-09: 10 mg via INTRAVENOUS
  Administered 2021-08-09 (×2): 5 mg via INTRAVENOUS

## 2021-08-09 MED ORDER — HEPARIN 6000 UNIT IRRIGATION SOLUTION
Status: DC | PRN
  Administered 2021-08-09: 1

## 2021-08-09 MED ORDER — CHLORHEXIDINE GLUCONATE 0.12 % MT SOLN
15.0000 mL | Freq: Once | OROMUCOSAL | Status: AC
  Administered 2021-08-09: 15 mL via OROMUCOSAL
  Filled 2021-08-09: qty 15

## 2021-08-09 MED ORDER — OXYCODONE HCL 5 MG PO TABS: 5.0000 mg | ORAL_TABLET | Freq: Four times a day (QID) | ORAL | 0 refills | Status: DC | PRN

## 2021-08-09 MED ORDER — HEPARIN SOD (PORK) LOCK FLUSH 100 UNIT/ML IV SOLN
INTRAVENOUS | Status: AC
  Filled 2021-08-09: qty 5

## 2021-08-09 MED ORDER — BUPIVACAINE HCL (PF) 0.25 % IJ SOLN
INTRAMUSCULAR | Status: AC
Start: 2021-08-09 — End: ?
  Filled 2021-08-09: qty 30

## 2021-08-09 SURGICAL SUPPLY — 53 items
ADH SKN CLS APL DERMABOND .7 (GAUZE/BANDAGES/DRESSINGS) ×2
ADH SKN CLS LQ APL DERMABOND (GAUZE/BANDAGES/DRESSINGS) ×4
APL PRP STRL LF DISP 70% ISPRP (MISCELLANEOUS) ×2
APPLIER CLIP 9.375 MED OPEN (MISCELLANEOUS) ×3
APR CLP MED 9.3 20 MLT OPN (MISCELLANEOUS) ×2
BAG COUNTER SPONGE SURGICOUNT (BAG) ×4 IMPLANT
BAG DECANTER FOR FLEXI CONT (MISCELLANEOUS) ×4 IMPLANT
BAG SPNG CNTER NS LX DISP (BAG) ×2
CANISTER SUCT 3000ML PPV (MISCELLANEOUS) ×4 IMPLANT
CHLORAPREP W/TINT 10.5 ML (MISCELLANEOUS) ×4 IMPLANT
CHLORAPREP W/TINT 26 (MISCELLANEOUS) ×4 IMPLANT
CLIP APPLIE 9.375 MED OPEN (MISCELLANEOUS) ×3 IMPLANT
CNTNR URN SCR LID CUP LEK RST (MISCELLANEOUS) ×3 IMPLANT
CONT SPEC 4OZ STRL OR WHT (MISCELLANEOUS) ×3
COVER PROBE W GEL 5X96 (DRAPES) ×4 IMPLANT
COVER SURGICAL LIGHT HANDLE (MISCELLANEOUS) ×4 IMPLANT
COVER TRANSDUCER ULTRASND GEL (DISPOSABLE) ×1 IMPLANT
DERMABOND ADHESIVE PROPEN (GAUZE/BANDAGES/DRESSINGS) ×2
DERMABOND ADVANCED (GAUZE/BANDAGES/DRESSINGS) ×1
DERMABOND ADVANCED .7 DNX12 (GAUZE/BANDAGES/DRESSINGS) ×3 IMPLANT
DERMABOND ADVANCED .7 DNX6 (GAUZE/BANDAGES/DRESSINGS) IMPLANT
DEVICE DUBIN SPECIMEN MAMMOGRA (MISCELLANEOUS) ×4 IMPLANT
DRAPE C-ARM 42X120 X-RAY (DRAPES) ×4 IMPLANT
DRAPE CHEST BREAST 15X10 FENES (DRAPES) ×4 IMPLANT
ELECT CAUTERY BLADE 6.4 (BLADE) ×4 IMPLANT
ELECT COATED BLADE 2.86 ST (ELECTRODE) ×4 IMPLANT
ELECT REM PT RETURN 9FT ADLT (ELECTROSURGICAL) ×3
ELECTRODE REM PT RTRN 9FT ADLT (ELECTROSURGICAL) ×3 IMPLANT
GLOVE BIO SURGEON STRL SZ7.5 (GLOVE) ×8 IMPLANT
GOWN STRL REUS W/ TWL LRG LVL3 (GOWN DISPOSABLE) ×6 IMPLANT
GOWN STRL REUS W/TWL LRG LVL3 (GOWN DISPOSABLE) ×6
KIT BASIN OR (CUSTOM PROCEDURE TRAY) ×4 IMPLANT
KIT MARKER MARGIN INK (KITS) ×4 IMPLANT
KIT PORT POWER 8FR ISP CVUE (Port) ×1 IMPLANT
KIT TURNOVER KIT B (KITS) ×4 IMPLANT
NDL HYPO 25GX1X1/2 BEV (NEEDLE) ×3 IMPLANT
NEEDLE HYPO 25GX1X1/2 BEV (NEEDLE) ×3 IMPLANT
NS IRRIG 1000ML POUR BTL (IV SOLUTION) ×4 IMPLANT
PACK GENERAL/GYN (CUSTOM PROCEDURE TRAY) ×4 IMPLANT
PAD ARMBOARD 7.5X6 YLW CONV (MISCELLANEOUS) ×4 IMPLANT
PENCIL BUTTON HOLSTER BLD 10FT (ELECTRODE) ×4 IMPLANT
POSITIONER HEAD DONUT 9IN (MISCELLANEOUS) ×4 IMPLANT
SUT MNCRL AB 4-0 PS2 18 (SUTURE) ×9 IMPLANT
SUT PROLENE 2 0 SH 30 (SUTURE) ×4 IMPLANT
SUT VIC AB 3-0 SH 18 (SUTURE) ×4 IMPLANT
SUT VIC AB 3-0 SH 27 (SUTURE) ×3
SUT VIC AB 3-0 SH 27XBRD (SUTURE) ×3 IMPLANT
SYR 5ML LUER SLIP (SYRINGE) ×4 IMPLANT
SYR CONTROL 10ML LL (SYRINGE) ×4 IMPLANT
TOWEL GREEN STERILE (TOWEL DISPOSABLE) ×4 IMPLANT
TOWEL GREEN STERILE FF (TOWEL DISPOSABLE) ×4 IMPLANT
TRACER MAGTRACE VIAL (MISCELLANEOUS) ×1 IMPLANT
TRAY LAPAROSCOPIC MC (CUSTOM PROCEDURE TRAY) ×4 IMPLANT

## 2021-08-09 NOTE — H&P (Signed)
REFERRING PHYSICIAN: Mel Almond*  PROVIDER: Landry Corporal, MD  MRN: [id number] DOB: 1960/12/15 Subjective   Chief Complaint: Breast Cancer   History of Present Illness: Christine [name] is a 61 y.o. female who is seen today as an office consultation for evaluation of Breast Cancer .   We are asked to see the patient in consultation by Dr.Iruku to evaluate the patient for a new left breast cancer. The patient is a 61 year old female who recently went for a routine screening mammogram. At that time she was found to have a 7 mm mass in the upper outer quadrant of the left breast. The axilla looked negative. 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 She is otherwise in good health and does not smoke. She does have a family history of breast cancer in her sister.  Review of Systems: A complete review of systems was obtained from the patient. I have reviewed this information and discussed as appropriate with the patient. See HPI as well for other ROS.  ROS   Medical History: Past Medical History:  Diagnosis Date  Hyperlipidemia  Hypertension   Patient Active Problem List  Diagnosis  Other long term (current) drug therapy  Mixed hyperlipidemia  Malignant neoplasm of upper-outer quadrant of left breast in female, estrogen receptor positive (CMS-HCC)  Gastroesophageal reflux disease without esophagitis  Essential hypertension  Atherosclerotic heart disease 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  Chronic cough  Globus pharyngeus  Insomnia  Sleep disorder  Vitamin D deficiency  Hearing loss in left ear  Easy bruising  Tendency toward bleeding easily (CMS-HCC)   Past Surgical History:  Procedure Laterality Date  CESAREAN SECTION Left  Unknown date  foot surgery Right  Unknown date    Allergies  Allergen Reactions  Lisinopril Other (See  Comments)  Mefloquine Other (See Comments)   Current Outpatient Medications on File Prior to Visit  Medication Sig Dispense Refill  aspirin 81 MG EC tablet Take 1 tablet by mouth once daily  omeprazole (PRILOSEC) 40 MG DR capsule Take 1 capsule by mouth once daily as needed  rosuvastatin (CRESTOR) 40 MG tablet Take 40 mg by mouth once daily  valsartan-hydroCHLOROthiazide (DIOVAN-HCT) 80-12.5 mg tablet Take 1 tablet by mouth once daily  zolpidem (AMBIEN) 10 mg tablet Take 5-10 mg by mouth at bedtime as needed   No current facility-administered medications on file prior to visit.   Family History  Problem Relation Age of Onset  Coronary Artery Disease (Blocked arteries around heart) Father  Hyperlipidemia (Elevated cholesterol) Father  High blood pressure (Hypertension) Father  Breast cancer Sister    Social History   Tobacco Use  Smoking Status Never  Smokeless Tobacco Never    Social History   Socioeconomic History  Marital status: Married  Tobacco Use  Smoking status: Never  Smokeless tobacco: Never  Vaping Use  Vaping Use: Never used  Substance and Sexual Activity  Alcohol use: Yes  Comment: glass of wine/beer occassionally  Drug use: Never  Sexual activity: Defer   Objective:  There were no vitals filed for this visit.  There is no height or weight on file to calculate BMI.  Physical Exam Vitals reviewed.  Constitutional:  General: She is not in acute distress. Appearance: Normal appearance.  HENT:  Head: Normocephalic and atraumatic.  Right Ear: External ear normal.  Left Ear: External ear normal.  Nose: Nose normal.  Mouth/Throat:  Mouth: Mucous membranes are moist.  Pharynx: Oropharynx is clear.  Eyes:  General: No scleral icterus. Extraocular Movements: Extraocular movements intact.  Conjunctiva/sclera: Conjunctivae normal.  Pupils: Pupils are equal, round, and reactive to light.  Cardiovascular:  Rate and Rhythm: Normal rate and regular  rhythm.  Pulses: Normal pulses.  Heart sounds: Normal heart sounds.  Pulmonary:  Effort: Pulmonary effort is normal. No respiratory distress.  Breath sounds: Normal breath sounds.  Abdominal:  General: Bowel sounds are normal.  Palpations: Abdomen is soft.  Tenderness: There is no abdominal tenderness.  Musculoskeletal:  General: No swelling, tenderness or deformity. Normal range of motion.  Cervical back: Normal range of motion and neck supple.  Skin: General: Skin is warm and dry.  Coloration: Skin is not jaundiced.  Neurological:  General: No focal deficit present.  Mental Status: She is alert and oriented to person, place, and time.  Psychiatric:  Mood and Affect: Mood normal.  Behavior: Behavior normal.    Breast: There is no palpable mass in either breast. There is no palpable axillary, supraclavicular, or cervical lymphadenopathy.  Labs, Imaging and Diagnostic Testing:  Assessment and Plan:   Diagnoses and all orders for this visit:  Malignant neoplasm of upper-outer quadrant of left breast in female, estrogen receptor positive (CMS-HCC)    The patient appears to have a 7 mm cancer in the upper outer quadrant of the left breast with clinically negative nodes. I have discussed with her in detail the different options for treatment and at this point she favors breast conservation which I feel is very reasonable. She is a good candidate for sentinel node mapping as well. I have discussed with her in detail the risks and benefits of the operation as well as some of the technical aspects including the use of a radioactive seed for localization and she understands and wishes to proceed. Given that she is HER2 positive she will also need a port for chemotherapy and this can be done at the same time. She will meet with medical and radiation oncology to discuss adjuvant therapy. She will also meet with physical therapy for preoperative lymphedema testing.

## 2021-08-09 NOTE — Anesthesia Procedure Notes (Signed)
Anesthesia Regional Block: Pectoralis block   Pre-Anesthetic Checklist: , timeout performed,  Correct Patient, Correct Site, Correct Laterality,  Correct Procedure, Correct Position, site marked,  Risks and benefits discussed,  Surgical consent,  Pre-op evaluation,  At surgeon's request and post-op pain management  Laterality: Left  Prep: chloraprep       Needles:  Injection technique: Single-shot      Needle Length: 9cm  Needle Gauge: 22     Additional Needles: Arrow StimuQuik ECHO Echogenic Stimulating PNB Needle  Procedures:,,,, ultrasound used (permanent image in chart),,    Narrative:  Start time: 08/09/2021 7:20 AM End time: 08/09/2021 7:30 AM Injection made incrementally with aspirations every 5 mL.  Performed by: Personally  Anesthesiologist: Oleta Mouse, MD

## 2021-08-09 NOTE — Op Note (Signed)
08/09/2021  9:54 AM  PATIENT:  Christine [name]  61 y.o. female  PRE-OPERATIVE DIAGNOSIS:  LEFT BREAST CANCER  POST-OPERATIVE DIAGNOSIS:  LEFT BREAST CANCER  PROCEDURE:  Procedure(s) with comments: LEFT BREAST RADIOACTIVE SEED LOCALIZED LUMPECTOMY AND SENTINEL NODE BIOPSY (Left) - GEN & PEC BLOCK PORT PLACEMENT RIGHT  INTERNAL JUGULAR  WITH ULTRASOUND GUIDANCE (Right) - RIGHT INTERNAL JUGULAR PLACEMENT  SURGEON:  Surgeon(s) and Role:    * Jovita Kussmaul, MD - Primary  PHYSICIAN ASSISTANT:   ASSISTANTS: none   ANESTHESIA:   local and general  EBL:  5 mL   BLOOD ADMINISTERED:none  DRAINS: none   LOCAL MEDICATIONS USED:  MARCAINE     SPECIMEN:  Source of Specimen:  left breast tissue with additional lateral and superior margins and sentinel node  DISPOSITION OF SPECIMEN:  PATHOLOGY  COUNTS:  YES  TOURNIQUET:  * No tourniquets in log *  DICTATION: .Dragon Dictation  After informed consent was obtained the patient was brought to the operating room and placed in the supine position on the operating table.  After adequate induction of general anesthesia a roll was placed between the patient's shoulder blades to extend the shoulder slightly.  The patient's bilateral chest neck and breast area were then prepped with ChloraPrep, allowed to dry, and draped in usual sterile manner.  An appropriate timeout was performed.  At this point, 2 cc of iron oxide were injected in the subareolar plexus on the left breast.  The patient was placed in Trendelenburg position.  The area lateral to the bend of the clavicle and the right chest wall was infiltrated with quarter percent Marcaine.  A large bore needle from the Port-A-Cath kit was used to slide beneath the bend of the clavicle heading towards the sternal notch.  I was not able to identify the right subclavian vein.  I then used the ultrasound to identify the right internal jugular vein and under ultrasound guidance was able to access the  right internal jugular vein with the large bore needle from the Port-A-Cath kit.  A wire was then fed through the needle using the Seldinger technique without difficulty.  The wire was confirmed in the central venous system using real-time fluoroscopy.  Next a small incision was made at the wire entry site and on the right chest wall.  The right chest wall incision was carried through the skin and subcutaneous tissue sharply with the electrocautery.  A subcutaneous pocket was created inferior to the incision by blunt finger dissection.  I then used blunt hemostat dissection to connect the 2 incisions over the clavicle.  A tendon passer was placed across the tunnel.  The tubing was brought through the tunnel.  The tubing was placed on the reservoir.  The reservoir was placed in the pocket and the length of the tubing was estimated using real-time fluoroscopy.  The tubing was cut to the appropriate length.  Next a sheath and dilator were fed over the wire using the Seldinger technique without difficulty.  The dilator and wire were removed from the patient.  The tubing was fed through the sheath as far as it would go and then held in place while the sheath was gently cracked and separated.  Another real-time fluoroscopy image showed the tip of the catheter to be in the distal superior vena cava.  The tubing was then permanently anchored to the reservoir.  The reservoir was anchored in the pocket with two 2-0 Prolene stitches.  The port was  then aspirated and it aspirated blood easily.  The port was then flushed initially with a dilute heparin solution and then with a more concentrated heparin solution.  The incision on the right neck was closed with an interrupted 4-0 Monocryl subcuticular stitch.  The subcutaneous tissue was closed over the port with interrupted 3-0 Vicryl stitches.  The skin was closed with a running 4-0 Monocryl subcuticular stitch.  Dermabond dressings were applied.  Attention was then turned to  the left breast.  The neoprobe was set to I-125 in the area of radioactivity was readily identified in the outer aspect of the left breast.  The area around this was infiltrated with quarter percent Marcaine.  A curvilinear incision was made along the outer edge of the areola of the left breast with a 15 blade knife.  The incision was carried through the skin and subcutaneous tissue sharply with the electrocautery.  Dissection was then carried throughout the outer part of the breast between the breast tissue and the subcutaneous fat and skin.  Once the dissection was well beyond the area of the cancer I then removed a circular portion of breast tissue sharply with the electrocautery around the radioactive seed while checking the area of radioactivity frequently.  Once the specimen was removed it was oriented with the appropriate paint colors.  A specimen radiograph was obtained that showed the clip and seed to be near the outer edge of the specimen.  I elected to take an additional lateral and superior margin and these were marked appropriately as well.  All of the tissue was sent to pathology for further evaluation.  Hemostasis was achieved using the Bovie electrocautery.  The cavity was marked with clips.  The wound was irrigated with saline and infiltrated with more quarter percent Marcaine.  The deep layer of the incision was then closed with layers of interrupted 3-0 Vicryl stitches.  The skin was then closed with interrupted 4-0 Monocryl subcuticular stitches.  Attention was then turned to the left axilla.  The mag trace was used to identify a signal in the left axilla.  This area was infiltrated with quarter percent Marcaine.  A small transversely or incision was made with a 15 blade knife overlying the area of signal.  The incision was carried through the skin and subcutaneous tissue sharply with the electrocautery until the deep left axillary space was entered.  The lymph node with the signal was excised  sharply with the electrocautery and the surrounding small vessels and lymphatics were controlled with clips.  There may have been 2-3 lymph nodes in the specimen that were palpable.  This was sent as sentinel node #1.  No other hot or palpable nodes were identified in the left axilla.  Hemostasis was achieved using the Bovie electrocautery.  The deep layer of the incision was closed with interrupted 3-0 Vicryl stitches.  The skin was then closed with a running 4-0 Monocryl subcuticular stitch.  Dermabond dressings were applied.  The patient tolerated the procedure well.  At the end of the case all needle sponge and instrument counts were correct.  The patient was then awakened and taken to recovery in stable condition.  PLAN OF CARE: Discharge to home after PACU  PATIENT DISPOSITION:  PACU - hemodynamically stable.   Delay start of Pharmacological VTE agent (>24hrs) due to surgical blood loss or risk of bleeding: not applicable

## 2021-08-09 NOTE — Anesthesia Preprocedure Evaluation (Signed)
Anesthesia Evaluation  Patient identified by MRN, date of birth, ID band Patient awake    Reviewed: Allergy & Precautions, NPO status , Patient's Chart, lab work & pertinent test results  History of Anesthesia Complications Negative for: history of anesthetic complications  Airway Mallampati: II  TM Distance: >3 FB Neck ROM: Full    Dental  (+) Dental Advisory Given   Pulmonary neg pulmonary ROS,    breath sounds clear to auscultation       Cardiovascular hypertension, Pt. on medications  Rhythm:Regular     Neuro/Psych negative psych ROS   GI/Hepatic Neg liver ROS, GERD  Medicated,  Endo/Other  negative endocrine ROS  Renal/GU negative Renal ROS     Musculoskeletal negative musculoskeletal ROS (+)   Abdominal   Peds  Hematology negative hematology ROS (+)   Anesthesia Other Findings  LEFT BREAST CANCER  Reproductive/Obstetrics                             Anesthesia Physical Anesthesia Plan  ASA: 2  Anesthesia Plan: General and Regional   Post-op Pain Management: Gabapentin PO (pre-op)*, Celebrex PO (pre-op)*, Tylenol PO (pre-op)* and Regional block*   Induction: Intravenous  PONV Risk Score and Plan: 3 and Ondansetron, Dexamethasone, Propofol infusion, TIVA and Midazolam  Airway Management Planned: LMA  Additional Equipment: None  Intra-op Plan:   Post-operative Plan: Extubation in OR  Informed Consent: I have reviewed the patients History and Physical, chart, labs and discussed the procedure including the risks, benefits and alternatives for the proposed anesthesia with the patient or authorized representative who has indicated his/her understanding and acceptance.     Dental advisory given  Plan Discussed with: CRNA  Anesthesia Plan Comments:         Anesthesia Quick Evaluation

## 2021-08-09 NOTE — Interval H&P Note (Signed)
History and Physical Interval Note:  08/09/2021 7:32 AM  Christine [name]  has presented today for surgery, with the diagnosis of LEFT BREAST CANCER.  The various methods of treatment have been discussed with the patient and family. After consideration of risks, benefits and other options for treatment, the patient has consented to  Procedure(s) with comments: LEFT BREAST RADIOACTIVE SEED LOCALIZED LUMPECTOMY AND SENTINEL NODE BIOPSY (Left) - GEN & PEC BLOCK PORT PLACEMENT WITH ULTRASOUND GUIDANCE (N/A) as a surgical intervention.  The patient's history has been reviewed, patient examined, no change in status, stable for surgery.  I have reviewed the patient's chart and labs.  Questions were answered to the patient's satisfaction.     Autumn Messing III

## 2021-08-09 NOTE — Telephone Encounter (Signed)
.  Called patient to schedule appointment per 7/7 inbasket, left pt msg

## 2021-08-09 NOTE — Transfer of Care (Signed)
Immediate Anesthesia Transfer of Care Note  Patient: HALLA CHOPP  Procedure(s) Performed: LEFT BREAST RADIOACTIVE SEED LOCALIZED LUMPECTOMY AND SENTINEL NODE BIOPSY (Left) PORT PLACEMENT RIGHT  INTERNAL JUGULAR  WITH ULTRASOUND GUIDANCE (Right: Chest)  Patient Location: PACU  Anesthesia Type:General and Regional  Level of Consciousness: drowsy  Airway & Oxygen Therapy: Patient Spontanous Breathing and Patient connected to face mask oxygen  Post-op Assessment: Report given to RN and Post -op Vital signs reviewed and stable  Post vital signs: Reviewed and stable  Last Vitals:  Vitals Value Taken Time  BP 100/55 08/09/21 1006  Temp 36.7 C 08/09/21 1005  Pulse 54 08/09/21 1009  Resp 20 08/09/21 1009  SpO2 97 % 08/09/21 1009  Vitals shown include unvalidated device data.  Last Pain:  Vitals:   08/09/21 0613  TempSrc:   PainSc: 0-No pain         Complications: No notable events documented.

## 2021-08-09 NOTE — Anesthesia Procedure Notes (Signed)
Procedure Name: LMA Insertion Date/Time: 08/09/2021 8:01 AM  Performed by: Ezequiel Kayser, CRNAPre-anesthesia Checklist: Patient identified, Emergency Drugs available, Suction available and Patient being monitored Patient Re-evaluated:Patient Re-evaluated prior to induction Oxygen Delivery Method: Circle System Utilized Preoxygenation: Pre-oxygenation with 100% oxygen Induction Type: IV induction Ventilation: Mask ventilation without difficulty LMA: LMA inserted LMA Size: 4.0 Number of attempts: 1 Airway Equipment and Method: Bite block Placement Confirmation: positive ETCO2 Tube secured with: Tape Dental Injury: Teeth and Oropharynx as per pre-operative assessment

## 2021-08-10 ENCOUNTER — Encounter (HOSPITAL_COMMUNITY): Payer: Self-pay | Admitting: General Surgery

## 2021-08-10 NOTE — Addendum Note (Signed)
Addendum  created 08/10/21 0826 by Oleta Mouse, MD   Clinical Note Signed, SmartForm saved

## 2021-08-10 NOTE — Anesthesia Postprocedure Evaluation (Addendum)
Anesthesia Post Note  Patient: Christine [name]  Procedure(s) Performed: LEFT BREAST RADIOACTIVE SEED LOCALIZED LUMPECTOMY AND SENTINEL NODE BIOPSY (Left) PORT PLACEMENT RIGHT  INTERNAL JUGULAR  WITH ULTRASOUND GUIDANCE (Right: Chest)     Patient location during evaluation: PACU Anesthesia Type: General and Regional Level of consciousness: awake and alert Pain management: pain level controlled Vital Signs Assessment: post-procedure vital signs reviewed and stable Respiratory status: spontaneous breathing, nonlabored ventilation, respiratory function stable and patient connected to nasal cannula oxygen Cardiovascular status: blood pressure returned to baseline and stable Postop Assessment: no apparent nausea or vomiting Anesthetic complications: no   No notable events documented.  Last Vitals:  Vitals:   08/09/21 1015 08/09/21 1024  BP: 100/61 107/63  Pulse: (!) 52 (!) 53  Resp: 11 13  Temp:  36.7 C  SpO2: 94% 99%    Last Pain:  Vitals:   08/09/21 1024  TempSrc:   PainSc: 0-No pain                 Ignacia Gentzler

## 2021-08-12 LAB — SURGICAL PATHOLOGY

## 2021-08-15 ENCOUNTER — Encounter: Payer: Self-pay | Admitting: *Deleted

## 2021-08-15 ENCOUNTER — Telehealth: Payer: Self-pay | Admitting: *Deleted

## 2021-08-15 NOTE — Telephone Encounter (Signed)
Returned patient's VM message regarding pathology report.  Left message on identified VM that yes margins/LN's were negative. Left contact information if she needs to return my call.

## 2021-08-16 ENCOUNTER — Telehealth: Payer: Self-pay | Admitting: Genetic Counselor

## 2021-08-16 ENCOUNTER — Ambulatory Visit: Payer: Self-pay | Admitting: Genetic Counselor

## 2021-08-16 DIAGNOSIS — Z1379 Encounter for other screening for genetic and chromosomal anomalies: Secondary | ICD-10-CM

## 2021-08-16 DIAGNOSIS — C50412 Malignant neoplasm of upper-outer quadrant of left female breast: Secondary | ICD-10-CM

## 2021-08-16 DIAGNOSIS — Z803 Family history of malignant neoplasm of breast: Secondary | ICD-10-CM

## 2021-08-16 NOTE — Telephone Encounter (Signed)
Contacted patient in attempt to disclose results of genetic testing.  LVM with contact information requesting a call back.  

## 2021-08-16 NOTE — Telephone Encounter (Signed)
Revealed negative genetic testing.  Discussed that we do not know why she has breast cancer or why there is cancer in the family. It could be sporadic/famillial, due to a change in a gene that she did not inherit, due to a different gene that we are not testing, or maybe our current technology may not be able to pick something up.  It will be important for her to keep in contact with genetics to keep up with whether additional testing may be needed.

## 2021-08-16 NOTE — Progress Notes (Signed)
HPI:   Ms. Shawhan was previously seen in the [facility] clinic due to a personal and family history of breast cancer and concerns regarding a hereditary predisposition to cancer. Please refer to our prior cancer genetics clinic note for more information regarding our discussion, assessment and recommendations, at the time. Ms. Lynde recent genetic test results were disclosed to her, as were recommendations warranted by these results. These results and recommendations are discussed in more detail below.  CANCER HISTORY:  Oncology History  Malignant neoplasm of upper-outer quadrant of left breast in female, estrogen receptor positive ([place])  07/17/2021 Mammogram   Suspicious left breast mass at 3 o'clock, 4 cm from the nipple. No axillary adenopathy. Targeted ultrasound is performed, showing an irregular centrally hypoechoic mass with an echogenic rim measuring 7 x 5 by 7 mm, thought to correlate with the mammographically identified mass. This mass is located at 3 o'clock, 4 cm from the nipple. No axillary adenopathy.   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female, estrogen receptor positive ([place])   07/29/2021 Cancer Staging   Staging form: Breast, AJCC 8th Edition - Clinical stage from 07/29/2021: Stage IA (cT1c, cN0, cM0, G3, ER+, PR-, HER2+) - Signed by Hayden Pedro, PA-C on 07/31/2021 Stage prefix: Initial diagnosis Method of lymph node assessment: Clinical Histologic grading system: 3 grade system    Pathology Results   Path showed IDC, grade 3, ER 100% positive, strong staining intensity, PR negative, ki 67 35%, Her 2 positive   08/05/2021 Genetic Testing   Negative hereditary cancer genetic testing: no pathogenic variants detected in [facility] BRCAPlus Panel and [employer] CustomNext-Cancer +RNAinsight Panel.  Report dates are August 05, 2021 and August 08, 2021.    The BRCAplus panel offered by [employer] and  includes sequencing and deletion/duplication analysis for the following 8 genes: ATM, BRCA1, BRCA2, CDH1, CHEK2, PALB2, PTEN, and TP53. The CustomNext-Cancer+RNAinsight panel offered by [facility] includes sequencing and rearrangement analysis for the following 47 genes:  APC, ATM, AXIN2, BARD1, BMPR1A, BRCA1, BRCA2, BRIP1, CDH1, CDK4, CDKN2A, CHEK2, DICER1, EPCAM, GREM1, HOXB13, MEN1, MLH1, MSH2, MSH3, MSH6, MUTYH, NBN, NF1, NF2, NTHL1, PALB2, PMS2, POLD1, POLE, PTEN, RAD51C, RAD51D, RECQL, RET, SDHA, SDHAF2, SDHB, SDHC, SDHD, SMAD4, SMARCA4, STK11, TP53, TSC1, TSC2, and VHL.  RNA data is routinely analyzed for use in variant interpretation for all genes.    FAMILY HISTORY:  We obtained a detailed, 4-generation family history.  Significant diagnoses are listed below:      Family History  Problem Relation Age of Onset   Heart disease Father     Breast cancer Sister 48   Esophageal cancer Maternal Uncle          dx after age 37   Lung cancer Paternal Uncle     Skin cancer Paternal Uncle       Ms. Blash is unaware of previous family history of genetic testing for hereditary cancer risks.  There is no reported Ashkenazi Jewish ancestry. Ms. Ion stated that her parents may be distantly related but are separated by more than three degrees of relation.  GENETIC TEST RESULTS:  The [employer] CustomNext-Cancer +RNAinsight Panel found no pathogenic mutations.   fThe CustomNext-Cancer+RNAinsight panel offered by [employer] includes sequencing and rearrangement analysis for the following 47 genes:  APC, ATM, AXIN2, BARD1, BMPR1A, BRCA1, BRCA2, BRIP1, CDH1, CDK4, CDKN2A, CHEK2, DICER1, EPCAM, GREM1, HOXB13, MEN1, MLH1, MSH2, MSH3, MSH6, MUTYH, NBN, NF1, NF2, NTHL1, PALB2,  PMS2, POLD1, POLE, PTEN, RAD51C, RAD51D, RECQL, RET, SDHA, SDHAF2, SDHB, SDHC, SDHD, SMAD4, SMARCA4, STK11, TP53, TSC1, TSC2, and VHL.  RNA data is routinely analyzed for use in variant interpretation for all genes. .   The  test report has been scanned into EPIC and is located under the Molecular Pathology section of the Results Review tab.  A portion of the result report is included below for reference. Genetic testing reported out on August 08, 2021.       Even though a pathogenic variant was not identified, possible explanations for the cancer in the family may include: There may be no hereditary risk for cancer in the family. The cancers in Ms. Glendening and/or her family may be sporadic/familial or due to other genetic and environmental factors. There may be a gene mutation in one of these genes that current testing methods cannot detect but that chance is small. There could be another gene that has not yet been discovered, or that we have not yet tested, that is responsible for the cancer diagnoses in the family.  It is also possible there is a hereditary cause for the cancer in the family that Ms. Ingalsbe did not inherit.   Therefore, it is important to remain in touch with cancer genetics in the future so that we can continue to offer Ms. Shrewsbury the most up to date genetic testing.     ADDITIONAL GENETIC TESTING:  We discussed with Ms. Revell that her genetic testing was fairly extensive.  If there are additional relevant genes identified to increase cancer risk that can be analyzed in the future, we would be happy to discuss and coordinate this testing at that time.     CANCER SCREENING RECOMMENDATIONS:  Ms. Lyman test result is considered negative (normal).  This means that we have not identified a hereditary cause for her personal history of breast cancer at this time.    An individual's cancer risk and medical management are not determined by genetic test results alone. Overall cancer risk assessment incorporates additional factors, including personal medical history, family history, and any available genetic information that may result in a personalized plan for cancer prevention and surveillance.  Therefore, it is recommended she continue to follow the cancer management and screening guidelines provided by her oncology and primary healthcare provider.  RECOMMENDATIONS FOR FAMILY MEMBERS:   Since she did not inherit a identifiable mutation in a cancer predisposition gene included on this panel, her children could not have inherited a known mutation from her in one of these genes. Individuals in this family might be at some increased risk of developing cancer, over the general population risk, due to the family history of cancer.  Individuals in the family should notify their providers of the family history of cancer. We recommend women in this family have a yearly mammogram beginning at age 23, or 64 years younger than the earliest onset of cancer, an annual clinical breast exam, and perform monthly breast self-exams.   Other members of the family may still carry a pathogenic variant in one of these genes that Ms. Cham did not inherit. Based on the family history, we recommend her sister, who was diagnosed with breast cancer at age 44, have genetic counseling and updated genetic testing. Ms. Corredor will let us know if we can be of any assistance in coordinating genetic counseling and/or testing for this family member.     FOLLOW-UP:  Lastly, we discussed with Ms. Lashley that cancer genetics is  a rapidly advancing field and it is possible that new genetic tests will be appropriate for her and/or her family members in the future. We encouraged her to remain in contact with cancer genetics on an annual basis so we can update her personal and family histories and let her know of advances in cancer genetics that may benefit this family.   Our contact number was provided. Ms. Jeangilles questions were answered to her satisfaction, and she knows she is welcome to call us at anytime with additional questions or concerns.   Summerlyn Fickel M. Joette Catching, [place], [facility] Genetic Counselor [email or web address] (P)  [phone]

## 2021-08-20 ENCOUNTER — Encounter (HOSPITAL_COMMUNITY): Payer: Self-pay

## 2021-08-30 ENCOUNTER — Encounter (HOSPITAL_BASED_OUTPATIENT_CLINIC_OR_DEPARTMENT_OTHER): Payer: Self-pay

## 2021-09-02 ENCOUNTER — Ambulatory Visit: Payer: No Typology Code available for payment source | Attending: General Surgery | Admitting: Rehabilitation

## 2021-09-02 DIAGNOSIS — Z483 Aftercare following surgery for neoplasm: Secondary | ICD-10-CM | POA: Diagnosis present

## 2021-09-02 DIAGNOSIS — C50412 Malignant neoplasm of upper-outer quadrant of left female breast: Secondary | ICD-10-CM | POA: Insufficient documentation

## 2021-09-02 DIAGNOSIS — R293 Abnormal posture: Secondary | ICD-10-CM | POA: Diagnosis present

## 2021-09-02 DIAGNOSIS — Z170 Estrogen receptor positive status [ER+]: Secondary | ICD-10-CM | POA: Insufficient documentation

## 2021-09-02 DIAGNOSIS — D509 Iron deficiency anemia, unspecified: Secondary | ICD-10-CM | POA: Insufficient documentation

## 2021-09-02 NOTE — Therapy (Signed)
OUTPATIENT PHYSICAL THERAPY BREAST CANCER POST OP FOLLOW UP   Patient Name: Christine [name] MRN: [id number] DOB:1960-08-28, 61 y.o., female Today's Date: 09/02/2021   PT End of Session - 09/02/21 1141     Visit Number 2    Number of Visits 2    Date for PT Re-Evaluation 09/25/21    PT Start Time 1100    PT Stop Time 1125    PT Time Calculation (min) 25 min    Activity Tolerance Patient tolerated treatment well    Behavior During Therapy [facility] for tasks assessed/performed             Past Medical History:  Diagnosis Date   Family history of breast cancer 07/31/2021   GERD (gastroesophageal reflux disease)    Hypertension    Past Surgical History:  Procedure Laterality Date   BREAST LUMPECTOMY WITH RADIOACTIVE SEED AND SENTINEL LYMPH NODE BIOPSY Left 08/09/2021   Procedure: LEFT BREAST RADIOACTIVE SEED LOCALIZED LUMPECTOMY AND SENTINEL NODE BIOPSY;  Surgeon: Jovita Kussmaul, MD;  Location: [facility] [place];  Service: General;  Laterality: Left;  GEN & PEC BLOCK   CESAREAN SECTION     FOOT SURGERY Right    PORTACATH PLACEMENT Right 08/09/2021   Procedure: PORT PLACEMENT RIGHT  INTERNAL JUGULAR  WITH ULTRASOUND GUIDANCE;  Surgeon: Jovita Kussmaul, MD;  Location: [facility];  Service: General;  Laterality: Right;  RIGHT INTERNAL JUGULAR PLACEMENT   Patient Active Problem List   Diagnosis Date Noted   Genetic testing 08/05/2021   Family history of breast cancer 07/31/2021   Malignant neoplasm of upper-outer quadrant of left breast in female, estrogen receptor positive ([place]) 07/25/2021   Chronic cough 08/31/2019   GERD (gastroesophageal reflux disease) 08/31/2019    PCP: Dr. Inda Merlin   REFERRING PROVIDER: Dr. Marlou Starks  REFERRING DIAG: Left breast cancer  THERAPY DIAG:  Malignant neoplasm of upper-outer quadrant of left breast in female, estrogen receptor positive ([place])  Abnormal posture  Aftercare following surgery for neoplasm  Rationale for Evaluation and Treatment Rehabilitation  ONSET  DATE: 07/08/21  SUBJECTIVE:                                                                                                                                                                                           SUBJECTIVE STATEMENT: I feel back to normal. Just tenderness at the incision sites.    PERTINENT HISTORY:  Patient was diagnosed on 07/08/2021 with left grade 3 invasive ductal carcinoma breast cancer. It measures 7 mm and is located in the upper outer quadrant. It is ER positive, PR negative, and HER2 positive with a Ki67 of  35%. Left lumpectomy and SLNB on 08/09/21 with port placement and 3 negative lymph nodes. Will be having chemotherapy with taxol and herceptin.   PATIENT GOALS:  Reassess how my recovery is going related to arm function, pain, and swelling.  PAIN:  Are you having pain? No  PRECAUTIONS: Recent Surgery, left UE Lymphedema risk  ACTIVITY LEVEL / LEISURE: I have to the gym once - I am walking.  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     OBJECTIVE:  PATIENT SURVEYS:  QUICK DASH: 4.55%  OBSERVATIONS:  Well healed incisions axilla and breast - no edema or cording noted. Bruising and dye present at nipple  POSTURE:  WNL  LYMPHEDEMA ASSESSMENT:   UPPER EXTREMITY AROM/PROM:   A/PROM RIGHT   eval    Shoulder extension 52  Shoulder flexion 160  Shoulder abduction 172  Shoulder internal rotation 70  Shoulder external rotation 89                          (Blank rows = not tested)   A/PROM LEFT   eval 09/02/21  Shoulder extension 51 50  Shoulder flexion 152 165  Shoulder abduction 168 168  Shoulder internal rotation 70 70  Shoulder external rotation 90 90                          (Blank rows = not tested)     LYMPHEDEMA ASSESSMENTS:    LANDMARK RIGHT   eval 09/02/21  10 cm proximal to olecranon process 24 25  Olecranon process 22 22  10  cm proximal to ulnar styloid process 20 19  Just proximal to ulnar styloid process 13.6 14  Across hand at thumb web space  17.5 17.5  At base of 2nd digit 5.8 5.8  (Blank rows = not tested)   LANDMARK LEFT   eval 09/02/21  10 cm proximal to olecranon process 24.8 25  Olecranon process 21.7 22  10  cm proximal to ulnar styloid process 19.4 20  Just proximal to ulnar styloid process 13.4 13.5  Across hand at thumb web space 17.5 17.5  At base of 2nd digit 5.8 5.8  (Blank rows = not tested)      PATIENT EDUCATION:  Education details: per instruction section below Person educated: Patient Education method: Theatre stage manager Education comprehension: verbalized understanding   HOME EXERCISE PROGRAM:  Reviewed previously given post op HEP.   ASSESSMENT:  CLINICAL IMPRESSION: Pt doing very well post operative.  Return to baseline AROM and walking and gym activities.  Will continue with SOZO surveillance.   Pt will benefit from skilled therapeutic intervention to improve on the following deficits: Decreased knowledge of precautions, impaired UE functional use, pain, decreased ROM, postural dysfunction.   PT treatment/interventions: ADL/Self care home management, Therapeutic exercises, Patient/Family education, Self Care, and Re-evaluation   GOALS: Goals reviewed with patient? Yes  LONG TERM GOALS:  (STG=LTG)  GOALS Name Target Date  Goal status  1 Pt will demonstrate she has regained full shoulder ROM and function post operatively compared to baselines.  Baseline: 09/02/21 MET                    PLAN: PT FREQUENCY/DURATION: SOZO surveillance only  PLAN FOR NEXT SESSION: SOZO   [facility]  [address]  [place] [address]  [phone]  After Breast Cancer Class It is recommended you attend the ABC class to  be educated on lymphedema risk reduction. This class is free of charge and lasts for 1 hour. It is a 1-time class. You will need to download the Webex app either on your phone or computer. We will send you a link the night before or the  morning of the class. You should be able to click on that link to join the class. This is not a confidential class. You don't have to turn your camera on, but other participants may be able to see your email address.  Scar massage You can begin gentle scar massage to you incision sites. Gently place one hand on the incision and move the skin (without sliding on the skin) in various directions. Do this for a few minutes and then you can gently massage either coconut oil or vitamin E cream into the scars.  Compression garment You should continue wearing your compression bra until you feel like you no longer have swelling.  Home exercise Program Continue doing the exercises you were given until you feel like you can do them without feeling any tightness at the end.   Walking Program Studies show that 30 minutes of walking per day (fast enough to elevate your heart rate) can significantly reduce the risk of a cancer recurrence. If you can't walk due to other medical reasons, we encourage you to find another activity you could do (like a stationary bike or water exercise).  Posture After breast cancer surgery, people frequently sit with rounded shoulders posture because it puts their incisions on slack and feels better. If you sit like this and scar tissue forms in that position, you can become very tight and have pain sitting or standing with good posture. Try to be aware of your posture and sit and stand up tall to heal properly.  Follow up PT: It is recommended you return every 3 months for the first 3 years following surgery to be assessed on the SOZO machine for an L-Dex score. This helps prevent clinically significant lymphedema in 95% of patients. These follow up screens are 10 minute appointments that you are not billed for.  Stark Bray, PT 09/02/2021, 11:46 AM

## 2021-09-02 NOTE — Patient Instructions (Addendum)
     [facility]  [address]  [place] [address]  [phone]  After Breast Cancer Class It is recommended you attend the ABC class to be educated on lymphedema risk reduction. This class is free of charge and lasts for 1 hour. It is a 1-time class. You will need to download the Webex app either on your phone or computer. We will send you a link the night before or the morning of the class. You should be able to click on that link to join the class. This is not a confidential class. You don't have to turn your camera on, but other participants may be able to see your email address.  Scar massage You can begin gentle scar massage to you incision sites. Gently place one hand on the incision and move the skin (without sliding on the skin) in various directions. Do this for a few minutes and then you can gently massage either coconut oil or vitamin E cream into the scars.  Compression garment You should continue wearing your compression bra until you feel like you no longer have swelling.  Walking Program Studies show that 30 minutes of walking per day (fast enough to elevate your heart rate) can significantly reduce the risk of a cancer recurrence. If you can't walk due to other medical reasons, we encourage you to find another activity you could do (like a stationary bike or water exercise).  Posture After breast cancer surgery, people frequently sit with rounded shoulders posture because it puts their incisions on slack and feels better. If you sit like this and scar tissue forms in that position, you can become very tight and have pain sitting or standing with good posture. Try to be aware of your posture and sit and stand up tall to heal properly.  Follow up PT: It is recommended you return every 3 months for the first 2 years following surgery to be assessed on the SOZO machine for an L-Dex score. This helps prevent clinically significant lymphedema in  95% of patients. These follow up screens are 10 minute appointments that you are not billed for.

## 2021-09-03 ENCOUNTER — Inpatient Hospital Stay: Payer: No Typology Code available for payment source

## 2021-09-03 ENCOUNTER — Inpatient Hospital Stay
Payer: No Typology Code available for payment source | Attending: Hematology and Oncology | Admitting: Hematology and Oncology

## 2021-09-03 ENCOUNTER — Encounter: Payer: Self-pay | Admitting: Hematology and Oncology

## 2021-09-03 ENCOUNTER — Other Ambulatory Visit: Payer: Self-pay

## 2021-09-03 VITALS — BP 150/73 | HR 56 | Temp 98.1°F | Resp 16 | Ht 64.0 in | Wt 138.1 lb

## 2021-09-03 DIAGNOSIS — Z170 Estrogen receptor positive status [ER+]: Secondary | ICD-10-CM | POA: Insufficient documentation

## 2021-09-03 DIAGNOSIS — K219 Gastro-esophageal reflux disease without esophagitis: Secondary | ICD-10-CM | POA: Insufficient documentation

## 2021-09-03 DIAGNOSIS — Z7952 Long term (current) use of systemic steroids: Secondary | ICD-10-CM | POA: Insufficient documentation

## 2021-09-03 DIAGNOSIS — Z5111 Encounter for antineoplastic chemotherapy: Secondary | ICD-10-CM | POA: Insufficient documentation

## 2021-09-03 DIAGNOSIS — Z803 Family history of malignant neoplasm of breast: Secondary | ICD-10-CM | POA: Insufficient documentation

## 2021-09-03 DIAGNOSIS — Z801 Family history of malignant neoplasm of trachea, bronchus and lung: Secondary | ICD-10-CM | POA: Insufficient documentation

## 2021-09-03 DIAGNOSIS — I10 Essential (primary) hypertension: Secondary | ICD-10-CM | POA: Insufficient documentation

## 2021-09-03 DIAGNOSIS — Z7982 Long term (current) use of aspirin: Secondary | ICD-10-CM | POA: Insufficient documentation

## 2021-09-03 DIAGNOSIS — Z79899 Other long term (current) drug therapy: Secondary | ICD-10-CM | POA: Diagnosis not present

## 2021-09-03 DIAGNOSIS — C50412 Malignant neoplasm of upper-outer quadrant of left female breast: Secondary | ICD-10-CM | POA: Diagnosis not present

## 2021-09-03 MED ORDER — ONDANSETRON HCL 8 MG PO TABS: 8.0000 mg | ORAL_TABLET | Freq: Three times a day (TID) | ORAL | 1 refills | Status: DC | PRN

## 2021-09-03 MED ORDER — PROCHLORPERAZINE MALEATE 10 MG PO TABS: 10.0000 mg | ORAL_TABLET | Freq: Four times a day (QID) | ORAL | 1 refills | Status: DC | PRN

## 2021-09-03 MED ORDER — LIDOCAINE-PRILOCAINE 2.5-2.5 % EX CREA: TOPICAL_CREAM | CUTANEOUS | 3 refills | Status: DC

## 2021-09-03 NOTE — Progress Notes (Signed)
This is a 61 year old postmenopausal female patient with newly diagnosed left breast invasive ductal carcinoma [facility] [place] NOTE  Patient Care Team: [name], MD as PCP - General (Internal Medicine) Buford Dresser, MD as PCP - Cardiology (Cardiology) Rockwell Germany, RN as Oncology Nurse Navigator Mauro Kaufmann, RN as Oncology Nurse Navigator Jovita Kussmaul, MD as Consulting Physician (General Surgery) Benay Pike, MD as Consulting Physician (Hematology and Oncology) Kyung Rudd, MD as Consulting Physician (Radiation Oncology)  CHIEF COMPLAINTS/PURPOSE OF CONSULTATION:  Newly diagnosed breast cancer  HISTORY OF PRESENTING ILLNESS:  KATHRYN COSBY 61 y.o. female is here because of recent diagnosis of left breast cancer  I reviewed her records extensively and collaborated the history with the patient.  SUMMARY OF ONCOLOGIC HISTORY: Oncology History  Malignant neoplasm of upper-outer quadrant of left breast in female, estrogen receptor positive ([place])  07/17/2021 Mammogram   Suspicious left breast mass at 3 o'clock, 4 cm from the nipple. No axillary adenopathy. Targeted ultrasound is performed, showing an irregular centrally hypoechoic mass with an echogenic rim measuring 7 x 5 by 7 mm, thought to correlate with the mammographically identified mass. This mass is located at 3 o'clock, 4 cm from the nipple. No axillary adenopathy.   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female, estrogen receptor positive ([place])   07/29/2021 Cancer Staging   Staging form: Breast, AJCC 8th Edition - Clinical stage from 07/29/2021: Stage IA (cT1c, cN0, cM0, G3, ER+, PR-, HER2+) - Signed by Hayden Pedro, PA-C on 07/31/2021 Stage prefix: Initial diagnosis Method of lymph node assessment: Clinical Histologic grading system: 3 grade system    Pathology Results   Path showed IDC, grade 3, ER 100%  positive, strong staining intensity, PR negative, ki 67 35%, Her 2 positive   08/05/2021 Genetic Testing   Negative hereditary cancer genetic testing: no pathogenic variants detected in [facility] BRCAPlus Panel and [facility] CustomNext-Cancer +RNAinsight Panel.  Report dates are August 05, 2021 and August 08, 2021.    The BRCAplus panel offered by [employer] and includes sequencing and deletion/duplication analysis for the following 8 genes: ATM, BRCA1, BRCA2, CDH1, CHEK2, PALB2, PTEN, and TP53. The CustomNext-Cancer+RNAinsight panel offered by [facility] includes sequencing and rearrangement analysis for the following 47 genes:  APC, ATM, AXIN2, BARD1, BMPR1A, BRCA1, BRCA2, BRIP1, CDH1, CDK4, CDKN2A, CHEK2, DICER1, EPCAM, GREM1, HOXB13, MEN1, MLH1, MSH2, MSH3, MSH6, MUTYH, NBN, NF1, NF2, NTHL1, PALB2, PMS2, POLD1, POLE, PTEN, RAD51C, RAD51D, RECQL, RET, SDHA, SDHAF2, SDHB, SDHC, SDHD, SMAD4, SMARCA4, STK11, TP53, TSC1, TSC2, and VHL.  RNA data is routinely analyzed for use in variant interpretation for all genes.   09/12/2021 -  Chemotherapy   Patient is on Treatment Plan : BREAST Paclitaxel + Trastuzumab q7d / Trastuzumab q21d      She is here for follow-up with her husband.  Since last visit, she had left breast lumpectomy with sentinel lymph node biopsy.  Final pathology showed a 1.2 cm tumor poorly differentiated invasive ductal carcinoma, high-grade 3 out of 3 lymph nodes with no evidence of micro or micrometastasis.   She is ready to get started on adjuvant therapy. She denies any complaints today.  Rest of the pertinent 10 point ROS reviewed and negative  MEDICAL HISTORY:  Past Medical History:  Diagnosis Date   Family history of breast cancer 07/31/2021   GERD (gastroesophageal reflux disease)    Hypertension     SURGICAL HISTORY: Past Surgical  History:  Procedure Laterality Date   BREAST LUMPECTOMY WITH RADIOACTIVE SEED AND SENTINEL LYMPH NODE BIOPSY Left 08/09/2021   Procedure: LEFT  BREAST RADIOACTIVE SEED LOCALIZED LUMPECTOMY AND SENTINEL NODE BIOPSY;  Surgeon: Jovita Kussmaul, MD;  Location: [facility];  Service: General;  Laterality: Left;  GEN & PEC BLOCK   CESAREAN SECTION     FOOT SURGERY Right    PORTACATH PLACEMENT Right 08/09/2021   Procedure: PORT PLACEMENT RIGHT  INTERNAL JUGULAR  WITH ULTRASOUND GUIDANCE;  Surgeon: Jovita Kussmaul, MD;  Location: [facility];  Service: General;  Laterality: Right;  RIGHT INTERNAL JUGULAR PLACEMENT    SOCIAL HISTORY: Social History   Socioeconomic History   Marital status: Married    Spouse name: Not on file   Number of children: Not on file   Years of education: Not on file   Highest education level: Not on file  Occupational History   Not on file  Tobacco Use   Smoking status: Never   Smokeless tobacco: Never  Vaping Use   Vaping Use: Never used  Substance and Sexual Activity   Alcohol use: Yes    Alcohol/week: 2.0 standard drinks of alcohol    Types: 2 Glasses of wine per week   Drug use: Never   Sexual activity: Not on file  Other Topics Concern   Not on file  Social History Narrative   Not on file   Social Determinants of Health   Financial Resource Strain: Not on file  Food Insecurity: Not on file  Transportation Needs: Not on file  Physical Activity: Not on file  Stress: Not on file  Social Connections: Not on file  Intimate Partner Violence: Not on file    FAMILY HISTORY: Family History  Problem Relation Age of Onset   Heart disease Father    Breast cancer Sister 5   Esophageal cancer Maternal Uncle        dx after age 81   Lung cancer Paternal Uncle    Skin cancer Paternal Uncle     ALLERGIES:  is allergic to lisinopril and mefloquine.  MEDICATIONS:  Current Outpatient Medications  Medication Sig Dispense Refill   acetaminophen (TYLENOL) 500 MG tablet Take 1,000 mg by mouth every 6 (six) hours as needed for moderate pain.     aspirin EC 81 MG tablet Take 81 mg by mouth daily. Swallow whole.      Cholecalciferol (DIALYVITE VITAMIN D 5000) 125 MCG (5000 UT) capsule Take 15,000 Units by mouth every Monday.     lidocaine-prilocaine (EMLA) cream Apply to affected area once 30 g 3   MAGNESIUM PO Take 150 mg by mouth 3 (three) times a week.     nitroGLYCERIN (NITROSTAT) 0.4 MG SL tablet Place 1 tablet (0.4 mg total) under the tongue every 5 (five) minutes as needed for chest pain. 90 tablet 3   Omega-3 Fatty Acids (OMEGA 3 500) 500 MG CAPS Take 500 mg by mouth daily.     omeprazole (PRILOSEC) 40 MG capsule Take 40 mg by mouth every evening.     ondansetron (ZOFRAN) 8 MG tablet Take 1 tablet (8 mg total) by mouth every 8 (eight) hours as needed for nausea or vomiting. 30 tablet 1   oxyCODONE (ROXICODONE) 5 MG immediate release tablet Take 1 tablet (5 mg total) by mouth every 6 (six) hours as needed for severe pain. 15 tablet 0   prochlorperazine (COMPAZINE) 10 MG tablet Take 1 tablet (10 mg total) by mouth every 6 (  six) hours as needed for nausea or vomiting. 30 tablet 1   rosuvastatin (CRESTOR) 40 MG tablet TAKE 1 TABLET BY MOUTH EVERY DAY 90 tablet 1   valsartan-hydrochlorothiazide (DIOVAN-HCT) 80-12.5 MG tablet Take 1 tablet by mouth daily. 90 tablet 3   zolpidem (AMBIEN) 10 MG tablet Take 5-10 mg by mouth at bedtime as needed for sleep.     No current facility-administered medications for this visit.   PHYSICAL EXAMINATION: ECOG PERFORMANCE STATUS: 0 - Asymptomatic  Vitals:   09/03/21 1608  BP: (!) 150/73  Pulse: (!) 56  Resp: 16  Temp: 98.1 F (36.7 C)  SpO2: 100%    Filed Weights   09/03/21 1608  Weight: 138 lb 1.6 oz (62.6 kg)     GENERAL:alert, no distress and comfortable [place] site appears well.  Left breast has healed completely since surgery.   LABORATORY DATA:  I have reviewed the data as listed Lab Results  Component Value Date   WBC 5.3 07/31/2021   HGB 12.4 07/31/2021   HCT 36.3 07/31/2021   MCV 89.4 07/31/2021   PLT 246 07/31/2021   Lab Results   Component Value Date   NA 138 07/31/2021   K 4.0 07/31/2021   CL 105 07/31/2021   CO2 28 07/31/2021    RADIOGRAPHIC STUDIES: I have personally reviewed the radiological reports and agreed with the findings in the report.  ASSESSMENT AND PLAN:  Malignant neoplasm of upper-outer quadrant of left breast in female, estrogen receptor positive ([place]) This is a very pleasant 61 year old postmenopausal female patient with newly diagnosed left breast invasive ductal carcinoma, grade 3, ER 100% positive strong staining PR 0% negative, Ki-67 of 35% and HER2 positive by FISH referred to breast [place] for additional recommendations.    Given small size although HER2 amplified invasive ductal carcinoma, we have discussed to proceed with upfront surgery followed by consideration for adjuvant Taxol with Herceptin. She is now status post lumpectomy and sentinel lymph node biopsy.  Final pathology showed poorly differentiated high-grade 1.2 cm IDC, negative margins, 0 out of 3 lymph nodes involved.  We have once again discussed about adjuvant Taxol with Herceptin.  She will proceed with weekly Taxol and Herceptin for 12 cycles followed by adjuvant Herceptin for a total duration of 1 year.  Baseline echo with EF of 65 to 70%, satisfactory.  Her left breast appears to have healed well.  She will start treatment next week and will return to clinic in about every 2 weeks for follow-up.  After completion of chemotherapy part, she will proceed with adjuvant radiation.  Antiestrogen therapy to be initiated after completing adjuvant radiation.  All her questions were answered to the best of my knowledge.   Total time spent: 30 minutes including history, physical exam, review of records, counseling and coordination of care All questions were answered. The patient knows to call the clinic with any problems, questions or concerns.    Benay Pike, MD 09/03/21

## 2021-09-03 NOTE — Assessment & Plan Note (Signed)
This is a very pleasant 61 year old postmenopausal female patient with newly diagnosed left breast invasive ductal carcinoma, grade 3, ER 100% positive strong staining PR 0% negative, Ki-67 of 35% and HER2 positive by FISH referred to breast [place] for additional recommendations.    Given small size although HER2 amplified invasive ductal carcinoma, we have discussed to proceed with upfront surgery followed by consideration for adjuvant Taxol with Herceptin. She is now status post lumpectomy and sentinel lymph node biopsy.  Final pathology showed poorly differentiated high-grade 1.2 cm IDC, negative margins, 0 out of 3 lymph nodes involved.  We have once again discussed about adjuvant Taxol with Herceptin.  She will proceed with weekly Taxol and Herceptin for 12 cycles followed by adjuvant Herceptin for a total duration of 1 year.  Baseline echo with EF of 65 to 70%, satisfactory.  Her left breast appears to have healed well.  She will start treatment next week and will return to clinic in about every 2 weeks for follow-up.  After completion of chemotherapy part, she will proceed with adjuvant radiation.  Antiestrogen therapy to be initiated after completing adjuvant radiation.  All her questions were answered to the best of my knowledge.

## 2021-09-03 NOTE — Progress Notes (Signed)
START ON PATHWAY REGIMEN - Breast ° ° °  Cycle 1: A cycle is 7 days: °    Trastuzumab-xxxx  °    Paclitaxel  °  Cycles 2 through 12: A cycle is every 7 days: °    Trastuzumab-xxxx  °    Paclitaxel  °  Cycles 13 through 25: A cycle is every 21 days: °    Trastuzumab-xxxx  ° °**Always confirm dose/schedule in your pharmacy ordering system** ° °Patient Characteristics: °Postoperative without Neoadjuvant Therapy (Pathologic Staging), Invasive Disease, Adjuvant Therapy, HER2 Positive, ER Positive, Node Negative, pT1c, pN0/N1mi °Therapeutic Status: Postoperative without Neoadjuvant Therapy (Pathologic Staging) °AJCC Grade: G3 °AJCC N Category: pN0 °AJCC M Category: cM0 °ER Status: Positive (+) °AJCC 8 Stage Grouping: IA °HER2 Status: Positive (+) °Oncotype Dx Recurrence Score: Not Appropriate °AJCC T Category: pT1c °PR Status: Negative (-) °Adjuvant Therapy Status: No Adjuvant Therapy Received Yet or Changing Initial Adjuvant Regimen due to Tolerance °Intent of Therapy: °Curative Intent, Discussed with Patient °

## 2021-09-04 ENCOUNTER — Other Ambulatory Visit: Payer: Self-pay

## 2021-09-05 ENCOUNTER — Encounter: Payer: Self-pay | Admitting: *Deleted

## 2021-09-05 NOTE — Progress Notes (Signed)
Pharmacist Chemotherapy Monitoring - Initial Assessment    Anticipated start date: 09/12/21   The following has been reviewed per standard work regarding the patient's treatment regimen: The patient's diagnosis, treatment plan and drug doses, and organ/hematologic function Lab orders and baseline tests specific to treatment regimen  The treatment plan start date, drug sequencing, and pre-medications Prior authorization status  Patient's documented medication list, including drug-drug interaction screen and prescriptions for anti-emetics and supportive care specific to the treatment regimen The drug concentrations, fluid compatibility, administration routes, and timing of the medications to be used The patient's access for treatment and lifetime cumulative dose history, if applicable  The patient's medication allergies and previous infusion related reactions, if applicable   Changes made to treatment plan:  N/A  Follow up needed:  Pending authorization for treatment    Larene Beach, [place], 09/05/2021  4:07 PM

## 2021-09-05 NOTE — Progress Notes (Signed)
The pharmacy team has substituted IV diphenhydramine for IV cetirizine as a premedication. Patient will be monitored for hypersensitivity reaction and adverse reactions to IV cetirizine. Thanks.    Kennith Center, Pharm.D., CPP 09/05/2021'@3'$ :43 PM

## 2021-09-11 ENCOUNTER — Encounter: Payer: Self-pay | Admitting: Hematology and Oncology

## 2021-09-12 ENCOUNTER — Encounter: Payer: Self-pay | Admitting: *Deleted

## 2021-09-12 ENCOUNTER — Other Ambulatory Visit: Payer: Self-pay

## 2021-09-12 ENCOUNTER — Inpatient Hospital Stay: Payer: No Typology Code available for payment source

## 2021-09-12 ENCOUNTER — Encounter: Payer: Self-pay | Admitting: Hematology and Oncology

## 2021-09-12 VITALS — BP 113/65 | HR 60 | Temp 98.0°F | Resp 17 | Ht 64.0 in | Wt 139.1 lb

## 2021-09-12 DIAGNOSIS — Z5111 Encounter for antineoplastic chemotherapy: Secondary | ICD-10-CM | POA: Diagnosis not present

## 2021-09-12 DIAGNOSIS — C50412 Malignant neoplasm of upper-outer quadrant of left female breast: Secondary | ICD-10-CM

## 2021-09-12 DIAGNOSIS — Z95828 Presence of other vascular implants and grafts: Secondary | ICD-10-CM

## 2021-09-12 LAB — CBC WITH DIFFERENTIAL (CANCER CENTER ONLY)
Abs Immature Granulocytes: 0.01 10*3/uL (ref 0.00–0.07)
Basophils Absolute: 0 10*3/uL (ref 0.0–0.1)
Basophils Relative: 1 %
Eosinophils Absolute: 0.2 10*3/uL (ref 0.0–0.5)
Eosinophils Relative: 4 %
HCT: 35 % — ABNORMAL LOW (ref 36.0–46.0)
Hemoglobin: 12 g/dL (ref 12.0–15.0)
Immature Granulocytes: 0 %
Lymphocytes Relative: 29 %
Lymphs Abs: 1.3 10*3/uL (ref 0.7–4.0)
MCH: 30.2 pg (ref 26.0–34.0)
MCHC: 34.3 g/dL (ref 30.0–36.0)
MCV: 87.9 fL (ref 80.0–100.0)
Monocytes Absolute: 0.4 10*3/uL (ref 0.1–1.0)
Monocytes Relative: 8 %
Neutro Abs: 2.7 10*3/uL (ref 1.7–7.7)
Neutrophils Relative %: 58 %
Platelet Count: 232 10*3/uL (ref 150–400)
RBC: 3.98 MIL/uL (ref 3.87–5.11)
RDW: 11.9 % (ref 11.5–15.5)
WBC Count: 4.6 10*3/uL (ref 4.0–10.5)
nRBC: 0 % (ref 0.0–0.2)

## 2021-09-12 LAB — CMP (CANCER CENTER ONLY)
ALT: 21 U/L (ref 0–44)
AST: 23 U/L (ref 15–41)
Albumin: 4.3 g/dL (ref 3.5–5.0)
Alkaline Phosphatase: 41 U/L (ref 38–126)
Anion gap: 4 — ABNORMAL LOW (ref 5–15)
BUN: 13 mg/dL (ref 8–23)
CO2: 28 mmol/L (ref 22–32)
Calcium: 8.9 mg/dL (ref 8.9–10.3)
Chloride: 107 mmol/L (ref 98–111)
Creatinine: 0.65 mg/dL (ref 0.44–1.00)
GFR, Estimated: >60 mL/min (ref >60)
Glucose, Bld: 93 mg/dL (ref 70–99)
Potassium: 4 mmol/L (ref 3.5–5.1)
Sodium: 139 mmol/L (ref 135–145)
Total Bilirubin: 1.2 mg/dL (ref 0.3–1.2)
Total Protein: 6.8 g/dL (ref 6.5–8.1)

## 2021-09-12 MED ORDER — CETIRIZINE HCL 10 MG/ML IV SOLN
10.0000 mg | Freq: Once | INTRAVENOUS | Status: AC
  Administered 2021-09-12: 10 mg via INTRAVENOUS
  Filled 2021-09-12: qty 1

## 2021-09-12 MED ORDER — SODIUM CHLORIDE 0.9 % IV SOLN
10.0000 mg | Freq: Once | INTRAVENOUS | Status: AC
  Administered 2021-09-12: 10 mg via INTRAVENOUS
  Filled 2021-09-12: qty 10

## 2021-09-12 MED ORDER — FAMOTIDINE IN NACL 20-0.9 MG/50ML-% IV SOLN
20.0000 mg | Freq: Once | INTRAVENOUS | Status: AC
  Administered 2021-09-12: 20 mg via INTRAVENOUS

## 2021-09-12 MED ORDER — SODIUM CHLORIDE 0.9% FLUSH
10.0000 mL | INTRAVENOUS | Status: DC | PRN
  Administered 2021-09-12: 10 mL

## 2021-09-12 MED ORDER — SODIUM CHLORIDE 0.9 % IV SOLN: Freq: Once | INTRAVENOUS | Status: AC

## 2021-09-12 MED ORDER — TRASTUZUMAB-DKST CHEMO 150 MG IV SOLR
4.0000 mg/kg | Freq: Once | INTRAVENOUS | Status: AC
  Administered 2021-09-12: 252 mg via INTRAVENOUS
  Filled 2021-09-12: qty 12

## 2021-09-12 MED ORDER — ACETAMINOPHEN 325 MG PO TABS
650.0000 mg | ORAL_TABLET | Freq: Once | ORAL | Status: AC
  Administered 2021-09-12: 650 mg via ORAL

## 2021-09-12 MED ORDER — ACETAMINOPHEN 325 MG PO TABS
ORAL_TABLET | ORAL | Status: AC
  Filled 2021-09-12: qty 2

## 2021-09-12 MED ORDER — SODIUM CHLORIDE 0.9 % IV SOLN
80.0000 mg/m2 | Freq: Once | INTRAVENOUS | Status: AC
  Administered 2021-09-12: 132 mg via INTRAVENOUS
  Filled 2021-09-12: qty 22

## 2021-09-12 MED ORDER — SODIUM CHLORIDE 0.9% FLUSH
10.0000 mL | Freq: Once | INTRAVENOUS | Status: AC
  Administered 2021-09-12: 10 mL

## 2021-09-12 MED ORDER — FAMOTIDINE IN NACL 20-0.9 MG/50ML-% IV SOLN
INTRAVENOUS | Status: AC
  Filled 2021-09-12: qty 50

## 2021-09-12 MED ORDER — HEPARIN SOD (PORK) LOCK FLUSH 100 UNIT/ML IV SOLN
500.0000 [IU] | Freq: Once | INTRAVENOUS | Status: AC | PRN
  Administered 2021-09-12: 500 [IU]

## 2021-09-12 NOTE — Patient Instructions (Signed)
[facility] ONCOLOGY  Discharge Instructions: Thank you for choosing [facility] to provide your oncology and hematology care.   If you have a lab appointment with the [facility], please go directly to the [facility] and check in at the registration area.   Wear comfortable clothing and clothing appropriate for easy access to any Portacath or PICC line.   We strive to give you quality time with your provider. You may need to reschedule your appointment if you arrive late (15 or more minutes).  Arriving late affects you and other patients whose appointments are after yours.  Also, if you miss three or more appointments without notifying the office, you may be dismissed from the clinic at the provider's discretion.      For prescription refill requests, have your pharmacy contact our office and allow 72 hours for refills to be completed.    Today you received the following chemotherapy and/or immunotherapy agents: Ogivri, Taxol      To help prevent nausea and vomiting after your treatment, we encourage you to take your nausea medication as directed.  BELOW ARE SYMPTOMS THAT SHOULD BE REPORTED IMMEDIATELY: *FEVER GREATER THAN 100.4 F (38 C) OR HIGHER *CHILLS OR SWEATING *NAUSEA AND VOMITING THAT IS NOT CONTROLLED WITH YOUR NAUSEA MEDICATION *UNUSUAL SHORTNESS OF BREATH *UNUSUAL BRUISING OR BLEEDING *URINARY PROBLEMS (pain or burning when urinating, or frequent urination) *BOWEL PROBLEMS (unusual diarrhea, constipation, pain near the anus) TENDERNESS IN MOUTH AND THROAT WITH OR WITHOUT PRESENCE OF ULCERS (sore throat, sores in mouth, or a toothache) UNUSUAL RASH, SWELLING OR PAIN  UNUSUAL VAGINAL DISCHARGE OR ITCHING   Items with * indicate a potential emergency and should be followed up as soon as possible or go to the Emergency Department if any problems should occur.  Please show the CHEMOTHERAPY ALERT CARD or IMMUNOTHERAPY ALERT CARD at check-in  to the Emergency Department and triage nurse.  Should you have questions after your visit or need to cancel or reschedule your appointment, please contact [facility]  Dept: [phone]  and follow the prompts.  Office hours are 8:00 a.m. to 4:30 p.m. Monday - Friday. Please note that voicemails left after 4:00 p.m. may not be returned until the following business day.  We are closed weekends and major holidays. You have access to a nurse at all times for urgent questions. Please call the main number to the clinic Dept: [phone] and follow the prompts.   For any non-urgent questions, you may also contact your provider using MyChart. We now offer e-Visits for anyone 61 and older to request care online for non-urgent symptoms. For details visit [email or web address].   Also download the MyChart app! Go to the app store, search "MyChart", open the app, select [facility], and log in with your MyChart username and password.  Masks are optional in the cancer centers. If you would like for your care team to wear a mask while they are taking care of you, please let them know. You may have one support person who is at least 61 years old accompany you for your appointments.  Trastuzumab Injection What is this medication? TRASTUZUMAB (tras TOO zoo mab) treats breast cancer and stomach cancer. It works by blocking a protein that causes cancer cells to grow and multiply. This helps to slow or stop the spread of cancer cells. This medicine may be used for other purposes; ask your health care provider or pharmacist if you have  questions. COMMON BRAND NAME(S): Herceptin, Janae Bridgeman, Ontruzant, Trazimera What should I tell my care team before I take this medication? They need to know if you have any of these conditions: Heart failure Lung disease An unusual or allergic reaction to trastuzumab, other medications, foods, dyes, or preservatives Pregnant or trying  to get pregnant Breast-feeding How should I use this medication? This medication is injected into a vein. It is given by your care team in a hospital or clinic setting. Talk to your care team about the use of this medication in children. It is not approved for use in children. Overdosage: If you think you have taken too much of this medicine contact a poison control center or emergency room at once. NOTE: This medicine is only for you. Do not share this medicine with others. What if I miss a dose? Keep appointments for follow-up doses. It is important not to miss your dose. Call your care team if you are unable to keep an appointment. What may interact with this medication? Certain types of chemotherapy, such as daunorubicin, doxorubicin, epirubicin, idarubicin This list may not describe all possible interactions. Give your health care provider a list of all the medicines, herbs, non-prescription drugs, or dietary supplements you use. Also tell them if you smoke, drink alcohol, or use illegal drugs. Some items may interact with your medicine. What should I watch for while using this medication? Your condition will be monitored carefully while you are receiving this medication. This medication may make you feel generally unwell. This is not uncommon, as chemotherapy affects healthy cells as well as cancer cells. Report any side effects. Continue your course of treatment even though you feel ill unless your care team tells you to stop. This medication may increase your risk of getting an infection. Call your care team for advice if you get a fever, chills, sore throat, or other symptoms of a cold or flu. Do not treat yourself. Try to avoid being around people who are sick. Avoid taking medications that contain aspirin, acetaminophen, ibuprofen, naproxen, or ketoprofen unless instructed by your care team. These medications can hide a fever. Talk to your care team if you may be pregnant. Serious birth  defects can occur if you take this medication during pregnancy and for 7 months after the last dose. You will need a negative pregnancy test before starting this medication. Contraception is recommended while taking this medication and for 7 months after the last dose. Your care team can help you find the option that works for you. Do not breastfeed while taking this medication and for 7 months after stopping treatment. What side effects may I notice from receiving this medication? Side effects that you should report to your care team as soon as possible: Allergic reactions or angioedema--skin rash, itching or hives, swelling of the face, eyes, lips, tongue, arms, or legs, trouble swallowing or breathing Dry cough, shortness of breath or trouble breathing Heart failure--shortness of breath, swelling of the ankles, feet, or hands, sudden weight gain, unusual weakness or fatigue Infection--fever, chills, cough, or sore throat Infusion reactions--chest pain, shortness of breath or trouble breathing, feeling faint or lightheaded Side effects that usually do not require medical attention (report to your care team if they continue or are bothersome): Diarrhea Dizziness Headache Nausea Trouble sleeping Vomiting This list may not describe all possible side effects. Call your doctor for medical advice about side effects. You may report side effects to FDA at 1-800-FDA-1088. Where should I keep  my medication? This medication is given in a hospital or clinic. It will not be stored at home. NOTE: This sheet is a summary. It may not cover all possible information. If you have questions about this medicine, talk to your doctor, pharmacist, or health care provider.  2023 Elsevier/Gold Standard (2021-06-04 00:00:00) Paclitaxel Injection What is this medication? PACLITAXEL (PAK li TAX el) treats some types of cancer. It works by slowing down the growth of cancer cells. This medicine may be used for other  purposes; ask your health care provider or pharmacist if you have questions. COMMON BRAND NAME(S): Onxol, Taxol What should I tell my care team before I take this medication? They need to know if you have any of these conditions: Heart disease Liver disease Low white blood cell levels An unusual or allergic reaction to paclitaxel, other medications, foods, dyes, or preservatives If you or your partner are pregnant or trying to get pregnant Breast-feeding How should I use this medication? This medication is injected into a vein. It is given by your care team in a hospital or clinic setting. Talk to your care team about the use of this medication in children. While it may be given to children for selected conditions, precautions do apply. Overdosage: If you think you have taken too much of this medicine contact a poison control center or emergency room at once. NOTE: This medicine is only for you. Do not share this medicine with others. What if I miss a dose? Keep appointments for follow-up doses. It is important not to miss your dose. Call your care team if you are unable to keep an appointment. What may interact with this medication? Do not take this medication with any of the following: Live virus vaccines Other medications may affect the way this medication works. Talk with your care team about all of the medications you take. They may suggest changes to your treatment plan to lower the risk of side effects and to make sure your medications work as intended. This list may not describe all possible interactions. Give your health care provider a list of all the medicines, herbs, non-prescription drugs, or dietary supplements you use. Also tell them if you smoke, drink alcohol, or use illegal drugs. Some items may interact with your medicine. What should I watch for while using this medication? Your condition will be monitored carefully while you are receiving this medication. You may need  blood work while taking this medication. This medication may make you feel generally unwell. This is not uncommon as chemotherapy can affect healthy cells as well as cancer cells. Report any side effects. Continue your course of treatment even though you feel ill unless your care team tells you to stop. This medication can cause serious allergic reactions. To reduce the risk, your care team may give you other medications to take before receiving this one. Be sure to follow the directions from your care team. This medication may increase your risk of getting an infection. Call your care team for advice if you get a fever, chills, sore throat, or other symptoms of a cold or flu. Do not treat yourself. Try to avoid being around people who are sick. This medication may increase your risk to bruise or bleed. Call your care team if you notice any unusual bleeding. Be careful brushing or flossing your teeth or using a toothpick because you may get an infection or bleed more easily. If you have any dental work done, tell your dentist you are  receiving this medication. Talk to your care team if you may be pregnant. Serious birth defects can occur if you take this medication during pregnancy. Talk to your care team before breastfeeding. Changes to your treatment plan may be needed. What side effects may I notice from receiving this medication? Side effects that you should report to your care team as soon as possible: Allergic reactions--skin rash, itching, hives, swelling of the face, lips, tongue, or throat Heart rhythm changes--fast or irregular heartbeat, dizziness, feeling faint or lightheaded, chest pain, trouble breathing Increase in blood pressure Infection--fever, chills, cough, sore throat, wounds that don't heal, pain or trouble when passing urine, general feeling of discomfort or being unwell Low blood pressure--dizziness, feeling faint or lightheaded, blurry vision Low red blood cell level--unusual  weakness or fatigue, dizziness, headache, trouble breathing Painful swelling, warmth, or redness of the skin, blisters or sores at the infusion site Pain, tingling, or numbness in the hands or feet Slow heartbeat--dizziness, feeling faint or lightheaded, confusion, trouble breathing, unusual weakness or fatigue Unusual bruising or bleeding Side effects that usually do not require medical attention (report to your care team if they continue or are bothersome): Diarrhea Hair loss Joint pain Loss of appetite Muscle pain Nausea Vomiting This list may not describe all possible side effects. Call your doctor for medical advice about side effects. You may report side effects to FDA at 1-800-FDA-1088. Where should I keep my medication? This medication is given in a hospital or clinic. It will not be stored at home. NOTE: This sheet is a summary. It may not cover all possible information. If you have questions about this medicine, talk to your doctor, pharmacist, or health care provider.  2023 Elsevier/Gold Standard (2021-06-06 00:00:00)

## 2021-09-17 ENCOUNTER — Ambulatory Visit: Payer: No Typology Code available for payment source | Attending: General Surgery | Admitting: Rehabilitation

## 2021-09-17 ENCOUNTER — Encounter: Payer: Self-pay | Admitting: Rehabilitation

## 2021-09-17 DIAGNOSIS — C50412 Malignant neoplasm of upper-outer quadrant of left female breast: Secondary | ICD-10-CM | POA: Insufficient documentation

## 2021-09-17 DIAGNOSIS — Z170 Estrogen receptor positive status [ER+]: Secondary | ICD-10-CM | POA: Diagnosis present

## 2021-09-17 DIAGNOSIS — R293 Abnormal posture: Secondary | ICD-10-CM | POA: Insufficient documentation

## 2021-09-17 DIAGNOSIS — Z483 Aftercare following surgery for neoplasm: Secondary | ICD-10-CM | POA: Insufficient documentation

## 2021-09-17 NOTE — Therapy (Signed)
OUTPATIENT PHYSICAL THERAPY TREATMENT   Patient Name: Christine [name] MRN: [id number] DOB:Jan 13, 1961, 61 y.o., female Today's Date: 09/17/2021   PT End of Session - 09/17/21 1000     Visit Number 3    Number of Visits 3    Date for PT Re-Evaluation 09/25/21    PT Start Time 1000    PT Stop Time 1015    PT Time Calculation (min) 15 min    Activity Tolerance Patient tolerated treatment well    Behavior During Therapy [facility] for tasks assessed/performed              Past Medical History:  Diagnosis Date   Family history of breast cancer 07/31/2021   GERD (gastroesophageal reflux disease)    Hypertension    Past Surgical History:  Procedure Laterality Date   BREAST LUMPECTOMY WITH RADIOACTIVE SEED AND SENTINEL LYMPH NODE BIOPSY Left 08/09/2021   Procedure: LEFT BREAST RADIOACTIVE SEED LOCALIZED LUMPECTOMY AND SENTINEL NODE BIOPSY;  Surgeon: Jovita Kussmaul, MD;  Location: [facility];  Service: General;  Laterality: Left;  GEN & PEC BLOCK   CESAREAN SECTION     FOOT SURGERY Right    PORTACATH PLACEMENT Right 08/09/2021   Procedure: PORT PLACEMENT RIGHT  INTERNAL JUGULAR  WITH ULTRASOUND GUIDANCE;  Surgeon: Jovita Kussmaul, MD;  Location: [facility];  Service: General;  Laterality: Right;  RIGHT INTERNAL JUGULAR PLACEMENT   Patient Active Problem List   Diagnosis Date Noted   Port-A-Cath in place 09/12/2021   Iron deficiency anemia 09/02/2021   Genetic testing 08/05/2021   Family history of breast cancer 07/31/2021   Atherosclerotic heart disease 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 07/31/2021   Easy bruising 07/31/2021   Essential hypertension 07/31/2021   Hearing loss in left ear 07/31/2021   Insomnia 07/31/2021   Mixed hyperlipidemia 07/31/2021   Other long term (current) drug therapy 07/31/2021   Sleep disorder 07/31/2021   Tendency toward bleeding easily ([facility]) 07/31/2021   Vitamin D deficiency 07/31/2021   Malignant neoplasm of upper-outer quadrant of left breast in  female, estrogen receptor positive ([facility]) 07/25/2021   Abnormal finding on mammography 07/10/2021   Malignant tumor of breast ([facility]) 07/10/2021   GERD (gastroesophageal reflux disease) 08/31/2019   Globus pharyngeus 08/02/2019   Rhinitis, chronic 08/02/2019   Elevated blood pressure reading 01/26/2019   Acute pain of right knee 10/26/2017    PCP: Dr. Inda Merlin   REFERRING PROVIDER: Dr. Marlou Starks  REFERRING DIAG: Left breast cancer  THERAPY DIAG:  Malignant neoplasm of upper-outer quadrant of left breast in female, estrogen receptor positive ([facility])  Abnormal posture  Aftercare following surgery for neoplasm  Rationale for Evaluation and Treatment Rehabilitation  ONSET DATE: 07/08/21  SUBJECTIVE:  SUBJECTIVE STATEMENT: My masseuse noticed some swelling around the shoulder blade and she wanted me to check it out.  I don't notice it at all.    PERTINENT HISTORY:  Patient was diagnosed on 07/08/2021 with left grade 3 invasive ductal carcinoma breast cancer. It measures 7 mm and is located in the upper outer quadrant. It is ER positive, PR negative, and HER2 positive with a Ki67 of 35%. Left lumpectomy and SLNB on 08/09/21 with port placement and 3 negative lymph nodes. Will be having chemotherapy with taxol and herceptin.   PATIENT GOALS:  Reassess how my recovery is going related to arm function, pain, and swelling.  PAIN:  Are you having pain? No  PRECAUTIONS: Recent Surgery, left UE Lymphedema risk  ACTIVITY LEVEL / LEISURE: I have to the gym once - I am walking.  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     OBJECTIVE:  PATIENT SURVEYS:  QUICK DASH: 4.55%  OBSERVATIONS:  Well healed incisions axilla and breast - no edema or cording noted. Bruising and dye present at nipple  POSTURE:  WNL  LYMPHEDEMA ASSESSMENT:   UPPER  EXTREMITY AROM/PROM:   A/PROM RIGHT   eval    Shoulder extension 52  Shoulder flexion 160  Shoulder abduction 172  Shoulder internal rotation 70  Shoulder external rotation 89                          (Blank rows = not tested)   A/PROM LEFT   eval 09/02/21  Shoulder extension 51 50  Shoulder flexion 152 165  Shoulder abduction 168 168  Shoulder internal rotation 70 70  Shoulder external rotation 90 90                          (Blank rows = not tested)     LYMPHEDEMA ASSESSMENTS:    LANDMARK RIGHT   eval 09/02/21  10 cm proximal to olecranon process 24 25  Olecranon process 22 22  10  cm proximal to ulnar styloid process 20 19  Just proximal to ulnar styloid process 13.6 14  Across hand at thumb web space 17.5 17.5  At base of 2nd digit 5.8 5.8  (Blank rows = not tested)   LANDMARK LEFT   eval 09/02/21 09/17/21  10 cm proximal to olecranon process 24.8 25 25   Olecranon process 21.7 22   10  cm proximal to ulnar styloid process 19.4 20   Just proximal to ulnar styloid process 13.4 13.5   Across hand at thumb web space 17.5 17.5   At base of 2nd digit 5.8 5.8   (Blank rows = not tested)   TODAY'S TREATMENT: 09/17/21: recheck of status today - no edema noted in axilla or shoulder blade or UE.  AROM still full.  Pt is not noting any fullness post exercise.  Discussed how post op edema is expected the first 3 months and how we don't even like to do the SOZO recheck at this time due to false positive.  Also discussed how she could keep using her compression bra if needed.  Pt encouraged as she wanted to make sure she was able to stop any lymphedema.    PATIENT EDUCATION:  Education details: per instruction section below Person educated: Patient Education method: Explanation and Handouts Education comprehension: verbalized understanding   HOME EXERCISE PROGRAM:  Reviewed previously given post op HEP.   ASSESSMENT:  CLINICAL IMPRESSION: See today's treatment: No further  visits needed except for SOZO.   Pt will benefit from skilled therapeutic intervention to improve on the following deficits: Decreased knowledge of precautions, impaired UE functional use, pain, decreased ROM, postural dysfunction.   PT treatment/interventions: ADL/Self care home management, Therapeutic exercises, Patient/Family education, Self Care, and Re-evaluation   GOALS: Goals reviewed with patient? Yes  LONG TERM GOALS:  (STG=LTG)  GOALS Name Target Date  Goal status  1 Pt will demonstrate she has regained full shoulder ROM and function post operatively compared to baselines.  Baseline: 09/02/21 MET                    PLAN: PT FREQUENCY/DURATION: SOZO surveillance only  PLAN FOR NEXT SESSION: SOZO   [facility]  [address], [address]  [place] [address]  [phone]  After Breast Cancer Class It is recommended you attend the ABC class to be educated on lymphedema risk reduction. This class is free of charge and lasts for 1 hour. It is a 1-time class. You will need to download the Webex app either on your phone or computer. We will send you a link the night before or the morning of the class. You should be able to click on that link to join the class. This is not a confidential class. You don't have to turn your camera on, but other participants may be able to see your email address.  Scar massage You can begin gentle scar massage to you incision sites. Gently place one hand on the incision and move the skin (without sliding on the skin) in various directions. Do this for a few minutes and then you can gently massage either coconut oil or vitamin E cream into the scars.  Compression garment You should continue wearing your compression bra until you feel like you no longer have swelling.  Home exercise Program Continue doing the exercises you were given until you feel like you can do them without feeling any tightness at the end.   Walking  Program Studies show that 30 minutes of walking per day (fast enough to elevate your heart rate) can significantly reduce the risk of a cancer recurrence. If you can't walk due to other medical reasons, we encourage you to find another activity you could do (like a stationary bike or water exercise).  Posture After breast cancer surgery, people frequently sit with rounded shoulders posture because it puts their incisions on slack and feels better. If you sit like this and scar tissue forms in that position, you can become very tight and have pain sitting or standing with good posture. Try to be aware of your posture and sit and stand up tall to heal properly.  Follow up PT: It is recommended you return every 3 months for the first 3 years following surgery to be assessed on the SOZO machine for an L-Dex score. This helps prevent clinically significant lymphedema in 95% of patients. These follow up screens are 10 minute appointments that you are not billed for.  Stark Bray, PT 09/17/2021, 10:28 AM

## 2021-09-18 ENCOUNTER — Encounter: Payer: Self-pay | Admitting: Hematology and Oncology

## 2021-09-18 MED FILL — Dexamethasone Sodium Phosphate Inj 100 MG/10ML: INTRAMUSCULAR | Qty: 1 | Status: AC

## 2021-09-18 NOTE — Progress Notes (Signed)
Called pt to introduce myself as her Arboriculturist, discuss copay assistance and the [employer].  Pt would like to apply and gave me consent to apply in her behalf so I completed the online application w/ the [employer] for [employer].  The application is pending so I will notify her of the outcome once I receive it.  I also informed her of the [employer] and went over what it covers but she declined it stating she probably wouldn't qualify and also wants to leave it for someone in greater need.  I will plan to meet her on 09/19/21 to give her my card for any questions or concerns she may have in the future.

## 2021-09-18 NOTE — Progress Notes (Signed)
Pt is approved w/ the [employer] for Ogivri for $25,000 from 09/18/21 to 09/18/22. Pt's responsibility for Madalyn Rob is $0.

## 2021-09-19 ENCOUNTER — Inpatient Hospital Stay: Payer: No Typology Code available for payment source

## 2021-09-19 ENCOUNTER — Inpatient Hospital Stay (HOSPITAL_BASED_OUTPATIENT_CLINIC_OR_DEPARTMENT_OTHER): Payer: No Typology Code available for payment source | Admitting: Hematology and Oncology

## 2021-09-19 ENCOUNTER — Encounter: Payer: Self-pay | Admitting: *Deleted

## 2021-09-19 ENCOUNTER — Other Ambulatory Visit: Payer: Self-pay

## 2021-09-19 ENCOUNTER — Encounter: Payer: Self-pay | Admitting: Hematology and Oncology

## 2021-09-19 VITALS — BP 132/60 | HR 72 | Resp 17

## 2021-09-19 DIAGNOSIS — Z5111 Encounter for antineoplastic chemotherapy: Secondary | ICD-10-CM | POA: Diagnosis not present

## 2021-09-19 DIAGNOSIS — C50412 Malignant neoplasm of upper-outer quadrant of left female breast: Secondary | ICD-10-CM

## 2021-09-19 DIAGNOSIS — Z95828 Presence of other vascular implants and grafts: Secondary | ICD-10-CM

## 2021-09-19 DIAGNOSIS — Z170 Estrogen receptor positive status [ER+]: Secondary | ICD-10-CM | POA: Diagnosis not present

## 2021-09-19 LAB — CBC WITH DIFFERENTIAL (CANCER CENTER ONLY)
Abs Immature Granulocytes: 0.02 10*3/uL (ref 0.00–0.07)
Basophils Absolute: 0 10*3/uL (ref 0.0–0.1)
Basophils Relative: 1 %
Eosinophils Absolute: 0.2 10*3/uL (ref 0.0–0.5)
Eosinophils Relative: 5 %
HCT: 31.7 % — ABNORMAL LOW (ref 36.0–46.0)
Hemoglobin: 10.9 g/dL — ABNORMAL LOW (ref 12.0–15.0)
Immature Granulocytes: 0 %
Lymphocytes Relative: 39 %
Lymphs Abs: 1.8 10*3/uL (ref 0.7–4.0)
MCH: 30.6 pg (ref 26.0–34.0)
MCHC: 34.4 g/dL (ref 30.0–36.0)
MCV: 89 fL (ref 80.0–100.0)
Monocytes Absolute: 0.2 10*3/uL (ref 0.1–1.0)
Monocytes Relative: 5 %
Neutro Abs: 2.3 10*3/uL (ref 1.7–7.7)
Neutrophils Relative %: 50 %
Platelet Count: 239 10*3/uL (ref 150–400)
RBC: 3.56 MIL/uL — ABNORMAL LOW (ref 3.87–5.11)
RDW: 11.8 % (ref 11.5–15.5)
WBC Count: 4.6 10*3/uL (ref 4.0–10.5)
nRBC: 0 % (ref 0.0–0.2)

## 2021-09-19 LAB — CMP (CANCER CENTER ONLY)
ALT: 24 U/L (ref 0–44)
AST: 23 U/L (ref 15–41)
Albumin: 4.2 g/dL (ref 3.5–5.0)
Alkaline Phosphatase: 40 U/L (ref 38–126)
Anion gap: 3 — ABNORMAL LOW (ref 5–15)
BUN: 13 mg/dL (ref 8–23)
CO2: 28 mmol/L (ref 22–32)
Calcium: 9 mg/dL (ref 8.9–10.3)
Chloride: 104 mmol/L (ref 98–111)
Creatinine: 0.67 mg/dL (ref 0.44–1.00)
GFR, Estimated: >60 mL/min (ref >60)
Glucose, Bld: 104 mg/dL — ABNORMAL HIGH (ref 70–99)
Potassium: 3.7 mmol/L (ref 3.5–5.1)
Sodium: 135 mmol/L (ref 135–145)
Total Bilirubin: 1.1 mg/dL (ref 0.3–1.2)
Total Protein: 6.6 g/dL (ref 6.5–8.1)

## 2021-09-19 MED ORDER — FAMOTIDINE IN NACL 20-0.9 MG/50ML-% IV SOLN
20.0000 mg | Freq: Once | INTRAVENOUS | Status: AC
  Administered 2021-09-19: 20 mg via INTRAVENOUS
  Filled 2021-09-19: qty 50

## 2021-09-19 MED ORDER — HEPARIN SOD (PORK) LOCK FLUSH 100 UNIT/ML IV SOLN
500.0000 [IU] | Freq: Once | INTRAVENOUS | Status: AC | PRN
  Administered 2021-09-19: 500 [IU]

## 2021-09-19 MED ORDER — SODIUM CHLORIDE 0.9 % IV SOLN
80.0000 mg/m2 | Freq: Once | INTRAVENOUS | Status: AC
  Administered 2021-09-19: 132 mg via INTRAVENOUS
  Filled 2021-09-19: qty 22

## 2021-09-19 MED ORDER — SODIUM CHLORIDE 0.9 % IV SOLN: Freq: Once | INTRAVENOUS | Status: AC

## 2021-09-19 MED ORDER — CETIRIZINE HCL 10 MG/ML IV SOLN
10.0000 mg | Freq: Once | INTRAVENOUS | Status: AC
  Administered 2021-09-19: 10 mg via INTRAVENOUS
  Filled 2021-09-19: qty 1

## 2021-09-19 MED ORDER — SODIUM CHLORIDE 0.9% FLUSH
10.0000 mL | INTRAVENOUS | Status: DC | PRN
  Administered 2021-09-19: 10 mL

## 2021-09-19 MED ORDER — SODIUM CHLORIDE 0.9 % IV SOLN
10.0000 mg | Freq: Once | INTRAVENOUS | Status: AC
  Administered 2021-09-19: 10 mg via INTRAVENOUS
  Filled 2021-09-19: qty 10

## 2021-09-19 MED ORDER — ACETAMINOPHEN 325 MG PO TABS
650.0000 mg | ORAL_TABLET | Freq: Once | ORAL | Status: AC
  Administered 2021-09-19: 650 mg via ORAL
  Filled 2021-09-19: qty 2

## 2021-09-19 MED ORDER — TRASTUZUMAB-DKST CHEMO 150 MG IV SOLR
2.0000 mg/kg | Freq: Once | INTRAVENOUS | Status: AC
  Administered 2021-09-19: 126 mg via INTRAVENOUS
  Filled 2021-09-19: qty 6

## 2021-09-19 MED ORDER — SODIUM CHLORIDE 0.9% FLUSH
10.0000 mL | Freq: Once | INTRAVENOUS | Status: AC
  Administered 2021-09-19: 10 mL

## 2021-09-19 NOTE — Assessment & Plan Note (Signed)
This is a very pleasant 60 year old postmenopausal female patient with newly diagnosed left breast invasive ductal carcinoma, grade 3, ER 100% positive strong staining PR 0% negative, Ki-67 of 35% and HER2 positive by FISH referred to breast [place] for additional recommendations.    Given small size although HER2 amplified invasive ductal carcinoma, we have discussed to proceed with upfront surgery followed by consideration for adjuvant Taxol with Herceptin. She is now status post lumpectomy and sentinel lymph node biopsy.  Final pathology showed poorly differentiated high-grade 1.2 cm IDC, negative margins, 0 out of 3 lymph nodes involved.    She is now on adjuvant Herceptin and Taxol weekly.  She is doing quite well.  No concerning adverse effects.  Physical examination unremarkable.  We have once again discussed about adverse effects of chemotherapy including neuropathy. She will return to clinic in 2 weeks for follow-up, continue weekly chemotherapy as scheduled.  She understands that Herceptin will continue for total of 1 year.  She declined cold cap.

## 2021-09-19 NOTE — Patient Instructions (Signed)
[facility]  Discharge Instructions: Thank you for choosing [facility] to provide your oncology and hematology care.   If you have a lab appointment with the [facility], please go directly to the [facility] and check in at the registration area.   Wear comfortable clothing and clothing appropriate for easy access to any Portacath or PICC line.   We strive to give you quality time with your provider. You may need to reschedule your appointment if you arrive late (15 or more minutes).  Arriving late affects you and other patients whose appointments are after yours.  Also, if you miss three or more appointments without notifying the office, you may be dismissed from the clinic at the provider's discretion.      For prescription refill requests, have your pharmacy contact our office and allow 72 hours for refills to be completed.    Today you received the following chemotherapy and/or immunotherapy agents: Ogivri, Taxol      To help prevent nausea and vomiting after your treatment, we encourage you to take your nausea medication as directed.  BELOW ARE SYMPTOMS THAT SHOULD BE REPORTED IMMEDIATELY: *FEVER GREATER THAN 100.4 F (38 C) OR HIGHER *CHILLS OR SWEATING *NAUSEA AND VOMITING THAT IS NOT CONTROLLED WITH YOUR NAUSEA MEDICATION *UNUSUAL SHORTNESS OF BREATH *UNUSUAL BRUISING OR BLEEDING *URINARY PROBLEMS (pain or burning when urinating, or frequent urination) *BOWEL PROBLEMS (unusual diarrhea, constipation, pain near the anus) TENDERNESS IN MOUTH AND THROAT WITH OR WITHOUT PRESENCE OF ULCERS (sore throat, sores in mouth, or a toothache) UNUSUAL RASH, SWELLING OR PAIN  UNUSUAL VAGINAL DISCHARGE OR ITCHING   Items with * indicate a potential emergency and should be followed up as soon as possible or go to the Emergency Department if any problems should occur.  Please show the CHEMOTHERAPY ALERT CARD or IMMUNOTHERAPY ALERT CARD at check-in  to the Emergency Department and triage nurse.  Should you have questions after your visit or need to cancel or reschedule your appointment, please contact [facility]  Dept: [phone]  and follow the prompts.  Office hours are 8:00 a.m. to 4:30 p.m. Monday - Friday. Please note that voicemails left after 4:00 p.m. may not be returned until the following business day.  We are closed weekends and major holidays. You have access to a nurse at all times for urgent questions. Please call the main number to the clinic Dept: [phone] and follow the prompts.   For any non-urgent questions, you may also contact your provider using MyChart. We now offer e-Visits for anyone 45 and older to request care online for non-urgent symptoms. For details visit [email or web address].   Also download the MyChart app! Go to the app store, search "MyChart", open the app, select [facility], and log in with your MyChart username and password.  Masks are optional in the cancer centers. If you would like for your care team to wear a mask while they are taking care of you, please let them know. You may have one support person who is at least 61 years old accompany you for your appointments.  Trastuzumab Injection What is this medication? TRASTUZUMAB (tras TOO zoo mab) treats breast cancer and stomach cancer. It works by blocking a protein that causes cancer cells to grow and multiply. This helps to slow or stop the spread of cancer cells. This medicine may be used for other purposes; ask your health care provider or pharmacist if you have  questions. COMMON BRAND NAME(S): Herceptin, Janae Bridgeman, Ontruzant, Trazimera What should I tell my care team before I take this medication? They need to know if you have any of these conditions: Heart failure Lung disease An unusual or allergic reaction to trastuzumab, other medications, foods, dyes, or preservatives Pregnant or trying  to get pregnant Breast-feeding How should I use this medication? This medication is injected into a vein. It is given by your care team in a hospital or clinic setting. Talk to your care team about the use of this medication in children. It is not approved for use in children. Overdosage: If you think you have taken too much of this medicine contact a poison control center or emergency room at once. NOTE: This medicine is only for you. Do not share this medicine with others. What if I miss a dose? Keep appointments for follow-up doses. It is important not to miss your dose. Call your care team if you are unable to keep an appointment. What may interact with this medication? Certain types of chemotherapy, such as daunorubicin, doxorubicin, epirubicin, idarubicin This list may not describe all possible interactions. Give your health care provider a list of all the medicines, herbs, non-prescription drugs, or dietary supplements you use. Also tell them if you smoke, drink alcohol, or use illegal drugs. Some items may interact with your medicine. What should I watch for while using this medication? Your condition will be monitored carefully while you are receiving this medication. This medication may make you feel generally unwell. This is not uncommon, as chemotherapy affects healthy cells as well as cancer cells. Report any side effects. Continue your course of treatment even though you feel ill unless your care team tells you to stop. This medication may increase your risk of getting an infection. Call your care team for advice if you get a fever, chills, sore throat, or other symptoms of a cold or flu. Do not treat yourself. Try to avoid being around people who are sick. Avoid taking medications that contain aspirin, acetaminophen, ibuprofen, naproxen, or ketoprofen unless instructed by your care team. These medications can hide a fever. Talk to your care team if you may be pregnant. Serious birth  defects can occur if you take this medication during pregnancy and for 7 months after the last dose. You will need a negative pregnancy test before starting this medication. Contraception is recommended while taking this medication and for 7 months after the last dose. Your care team can help you find the option that works for you. Do not breastfeed while taking this medication and for 7 months after stopping treatment. What side effects may I notice from receiving this medication? Side effects that you should report to your care team as soon as possible: Allergic reactions or angioedema--skin rash, itching or hives, swelling of the face, eyes, lips, tongue, arms, or legs, trouble swallowing or breathing Dry cough, shortness of breath or trouble breathing Heart failure--shortness of breath, swelling of the ankles, feet, or hands, sudden weight gain, unusual weakness or fatigue Infection--fever, chills, cough, or sore throat Infusion reactions--chest pain, shortness of breath or trouble breathing, feeling faint or lightheaded Side effects that usually do not require medical attention (report to your care team if they continue or are bothersome): Diarrhea Dizziness Headache Nausea Trouble sleeping Vomiting This list may not describe all possible side effects. Call your doctor for medical advice about side effects. You may report side effects to FDA at 1-800-FDA-1088. Where should I keep  my medication? This medication is given in a hospital or clinic. It will not be stored at home. NOTE: This sheet is a summary. It may not cover all possible information. If you have questions about this medicine, talk to your doctor, pharmacist, or health care provider.  2023 Elsevier/Gold Standard (2021-06-04 00:00:00) Paclitaxel Injection What is this medication? PACLITAXEL (PAK li TAX el) treats some types of cancer. It works by slowing down the growth of cancer cells. This medicine may be used for other  purposes; ask your health care provider or pharmacist if you have questions. COMMON BRAND NAME(S): Onxol, Taxol What should I tell my care team before I take this medication? They need to know if you have any of these conditions: Heart disease Liver disease Low white blood cell levels An unusual or allergic reaction to paclitaxel, other medications, foods, dyes, or preservatives If you or your partner are pregnant or trying to get pregnant Breast-feeding How should I use this medication? This medication is injected into a vein. It is given by your care team in a hospital or clinic setting. Talk to your care team about the use of this medication in children. While it may be given to children for selected conditions, precautions do apply. Overdosage: If you think you have taken too much of this medicine contact a poison control center or emergency room at once. NOTE: This medicine is only for you. Do not share this medicine with others. What if I miss a dose? Keep appointments for follow-up doses. It is important not to miss your dose. Call your care team if you are unable to keep an appointment. What may interact with this medication? Do not take this medication with any of the following: Live virus vaccines Other medications may affect the way this medication works. Talk with your care team about all of the medications you take. They may suggest changes to your treatment plan to lower the risk of side effects and to make sure your medications work as intended. This list may not describe all possible interactions. Give your health care provider a list of all the medicines, herbs, non-prescription drugs, or dietary supplements you use. Also tell them if you smoke, drink alcohol, or use illegal drugs. Some items may interact with your medicine. What should I watch for while using this medication? Your condition will be monitored carefully while you are receiving this medication. You may need  blood work while taking this medication. This medication may make you feel generally unwell. This is not uncommon as chemotherapy can affect healthy cells as well as cancer cells. Report any side effects. Continue your course of treatment even though you feel ill unless your care team tells you to stop. This medication can cause serious allergic reactions. To reduce the risk, your care team may give you other medications to take before receiving this one. Be sure to follow the directions from your care team. This medication may increase your risk of getting an infection. Call your care team for advice if you get a fever, chills, sore throat, or other symptoms of a cold or flu. Do not treat yourself. Try to avoid being around people who are sick. This medication may increase your risk to bruise or bleed. Call your care team if you notice any unusual bleeding. Be careful brushing or flossing your teeth or using a toothpick because you may get an infection or bleed more easily. If you have any dental work done, tell your dentist you are  receiving this medication. Talk to your care team if you may be pregnant. Serious birth defects can occur if you take this medication during pregnancy. Talk to your care team before breastfeeding. Changes to your treatment plan may be needed. What side effects may I notice from receiving this medication? Side effects that you should report to your care team as soon as possible: Allergic reactions--skin rash, itching, hives, swelling of the face, lips, tongue, or throat Heart rhythm changes--fast or irregular heartbeat, dizziness, feeling faint or lightheaded, chest pain, trouble breathing Increase in blood pressure Infection--fever, chills, cough, sore throat, wounds that don't heal, pain or trouble when passing urine, general feeling of discomfort or being unwell Low blood pressure--dizziness, feeling faint or lightheaded, blurry vision Low red blood cell level--unusual  weakness or fatigue, dizziness, headache, trouble breathing Painful swelling, warmth, or redness of the skin, blisters or sores at the infusion site Pain, tingling, or numbness in the hands or feet Slow heartbeat--dizziness, feeling faint or lightheaded, confusion, trouble breathing, unusual weakness or fatigue Unusual bruising or bleeding Side effects that usually do not require medical attention (report to your care team if they continue or are bothersome): Diarrhea Hair loss Joint pain Loss of appetite Muscle pain Nausea Vomiting This list may not describe all possible side effects. Call your doctor for medical advice about side effects. You may report side effects to FDA at 1-800-FDA-1088. Where should I keep my medication? This medication is given in a hospital or clinic. It will not be stored at home. NOTE: This sheet is a summary. It may not cover all possible information. If you have questions about this medicine, talk to your doctor, pharmacist, or health care provider.  2023 Elsevier/Gold Standard (2021-06-06 00:00:00)

## 2021-09-19 NOTE — Progress Notes (Signed)
This is a [name] patient with newly diagnosed left breast invasive ductal carcinoma [facility] NOTE  Patient Care Team: [name], MD as PCP - General (Internal Medicine) Buford Dresser, MD as PCP - Cardiology (Cardiology) Rockwell Germany, RN as Oncology Nurse Navigator Mauro Kaufmann, RN as Oncology Nurse Navigator Jovita Kussmaul, MD as Consulting Physician (General Surgery) Benay Pike, MD as Consulting Physician (Hematology and Oncology) Kyung Rudd, MD as Consulting Physician (Radiation Oncology)  CHIEF COMPLAINTS/PURPOSE OF CONSULTATION:  Newly diagnosed breast cancer  HISTORY OF PRESENTING ILLNESS:  Christine [name] 61 y.o. [name] is here because of recent diagnosis of left breast cancer  I reviewed her records extensively and collaborated the history with the patient.  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 No axillary adenopathy. Targeted ultrasound is performed, showing an irregular centrally hypoechoic mass with an echogenic rim measuring 7 x 5 by 7 mm, thought to correlate with the mammographically identified mass. This mass is located at 3 o'clock, 4 cm from the nipple. No axillary adenopathy.   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[name], estrogen receptor positive ([facility])   07/29/2021 Cancer Staging   Staging form: Breast, AJCC 8th Edition - Clinical stage from 07/29/2021: Stage IA (cT1c, cN0, cM0, G3, ER+, PR-, HER2+) - Signed by Hayden Pedro, PA-C on 07/31/2021 Stage prefix: Initial diagnosis Method of lymph node assessment: Clinical Histologic grading system: 3 grade system    Pathology Results   Path showed IDC, grade 3, ER 100%  positive, strong staining intensity, PR negative, ki 67 35%, Her 2 positive   08/05/2021 Genetic Testing   Negative hereditary cancer genetic testing: no pathogenic variants detected in [facility] BRCAPlus Panel and [facility] CustomNext-Cancer +RNAinsight Panel.  Report dates are August 05, 2021 and August 08, 2021.    The BRCAplus panel offered by [employer] and includes sequencing and deletion/duplication analysis for the following 8 genes: ATM, BRCA1, BRCA2, CDH1, CHEK2, PALB2, PTEN, and TP53. The CustomNext-Cancer+RNAinsight panel offered by [facility] includes sequencing and rearrangement analysis for the following 47 genes:  APC, ATM, AXIN2, BARD1, BMPR1A, BRCA1, BRCA2, BRIP1, CDH1, CDK4, CDKN2A, CHEK2, DICER1, EPCAM, GREM1, HOXB13, MEN1, MLH1, MSH2, MSH3, MSH6, MUTYH, NBN, NF1, NF2, NTHL1, PALB2, PMS2, POLD1, POLE, PTEN, RAD51C, RAD51D, RECQL, RET, SDHA, SDHAF2, SDHB, SDHC, SDHD, SMAD4, SMARCA4, STK11, TP53, TSC1, TSC2, and VHL.  RNA data is routinely analyzed for use in variant interpretation for all genes.   09/12/2021 -  Chemotherapy   Patient is on Treatment Plan : BREAST Paclitaxel + Trastuzumab q7d / Trastuzumab q21d      She is here for follow-up with her husband.   She is now s/p first cycle of adjuvant Taxol and Herceptin and tolerated it very well.  She might have felt a bit tired and some sleep disturbance the first 2 nights but otherwise no complaints. Rest of the pertinent 10 point ROS reviewed and negative  MEDICAL HISTORY:  Past Medical History:  Diagnosis Date   Family history of breast cancer 07/31/2021   GERD (gastroesophageal reflux disease)    Hypertension     SURGICAL HISTORY: Past Surgical History:  Procedure Laterality Date   BREAST LUMPECTOMY WITH RADIOACTIVE SEED AND SENTINEL LYMPH NODE BIOPSY Left 08/09/2021  Procedure: LEFT BREAST RADIOACTIVE SEED LOCALIZED LUMPECTOMY AND SENTINEL NODE BIOPSY;  Surgeon: Jovita Kussmaul, MD;  Location: [facility] OR;  Service: General;   Laterality: Left;  GEN & PEC BLOCK   CESAREAN SECTION     FOOT SURGERY Right    PORTACATH PLACEMENT Right 08/09/2021   Procedure: PORT PLACEMENT RIGHT  INTERNAL JUGULAR  WITH ULTRASOUND GUIDANCE;  Surgeon: Jovita Kussmaul, MD;  Location: [facility];  Service: General;  Laterality: Right;  RIGHT INTERNAL JUGULAR PLACEMENT    SOCIAL HISTORY: Social History   Socioeconomic History   Marital status: Married    Spouse name: Not on file   Number of children: Not on file   Years of education: Not on file   Highest education level: Not on file  Occupational History   Not on file  Tobacco Use   Smoking status: Never   Smokeless tobacco: Never  Vaping Use   Vaping Use: Never used  Substance and Sexual Activity   Alcohol use: Yes    Alcohol/week: 2.0 standard drinks of alcohol    Types: 2 Glasses of wine per week   Drug use: Never   Sexual activity: Not on file  Other Topics Concern   Not on file  Social History Narrative   Not on file   Social Determinants of Health   Financial Resource Strain: Not on file  Food Insecurity: Not on file  Transportation Needs: Not on file  Physical Activity: Not on file  Stress: Not on file  Social Connections: Not on file  Intimate Partner Violence: Not on file    FAMILY HISTORY: Family History  Problem Relation Age of Onset   Heart disease Father    Breast cancer Sister 69   Esophageal cancer Maternal Uncle        dx after age 82   Lung cancer Paternal Uncle    Skin cancer Paternal Uncle     ALLERGIES:  is allergic to lisinopril and mefloquine.  MEDICATIONS:  Current Outpatient Medications  Medication Sig Dispense Refill   acetaminophen (TYLENOL) 500 MG tablet Take 1,000 mg by mouth every 6 (six) hours as needed for moderate pain.     aspirin EC 81 MG tablet Take 81 mg by mouth daily. Swallow whole.     Cholecalciferol (DIALYVITE VITAMIN D 5000) 125 MCG (5000 UT) capsule Take 15,000 Units by mouth every Monday.     lidocaine-prilocaine  (EMLA) cream Apply to affected area once 30 g 3   MAGNESIUM PO Take 150 mg by mouth 3 (three) times a week.     nitroGLYCERIN (NITROSTAT) 0.4 MG SL tablet Place 1 tablet (0.4 mg total) under the tongue every 5 (five) minutes as needed for chest pain. 90 tablet 3   Omega-3 Fatty Acids (OMEGA 3 500) 500 MG CAPS Take 500 mg by mouth daily.     omeprazole (PRILOSEC) 40 MG capsule Take 40 mg by mouth every evening.     ondansetron (ZOFRAN) 8 MG tablet Take 1 tablet (8 mg total) by mouth every 8 (eight) hours as needed for nausea or vomiting. 30 tablet 1   oxyCODONE (ROXICODONE) 5 MG immediate release tablet Take 1 tablet (5 mg total) by mouth every 6 (six) hours as needed for severe pain. 15 tablet 0   prochlorperazine (COMPAZINE) 10 MG tablet Take 1 tablet (10 mg total) by mouth every 6 (six) hours as needed for nausea or vomiting. 30 tablet 1   rosuvastatin (CRESTOR) 40 MG tablet TAKE 1 TABLET  BY MOUTH EVERY DAY 90 tablet 1   valsartan-hydrochlorothiazide (DIOVAN-HCT) 80-12.5 MG tablet Take 1 tablet by mouth daily. 90 tablet 3   zolpidem (AMBIEN) 10 MG tablet Take 5-10 mg by mouth at bedtime as needed for sleep.     No current facility-administered medications for this visit.   Facility-Administered Medications Ordered in Other Visits  Medication Dose Route Frequency Provider Last Rate Last Admin   cetirizine (QUZYTTIR) injection 10 mg  10 mg Intravenous Once Serah Nicoletti, Arletha Pili, MD       dexamethasone (DECADRON) 10 mg in sodium chloride 0.9 % 50 mL IVPB  10 mg Intravenous Once Brittini Brubeck, Arletha Pili, MD       famotidine (PEPCID) IVPB 20 mg premix  20 mg Intravenous Once Quenesha Douglass, Arletha Pili, MD 200 mL/hr at 09/19/21 1347 20 mg at 09/19/21 1347   heparin lock flush 100 unit/mL  500 Units Intracatheter Once PRN Pearl Bents, Arletha Pili, MD       PACLitaxel (TAXOL) 132 mg in sodium chloride 0.9 % 250 mL chemo infusion (</= 64m/m2)  80 mg/m2 (Treatment Plan Recorded) Intravenous Once Georgette Helmer, MD       sodium chloride  flush (NS) 0.9 % injection 10 mL  10 mL Intracatheter PRN Nathania Waldman, MD       trastuzumab-dkst (OGIVRI) 126 mg in sodium chloride 0.9 % 250 mL chemo infusion  2 mg/kg (Treatment Plan Recorded) Intravenous Once Chase Knebel, PArletha Pili MD       PHYSICAL EXAMINATION: ECOG PERFORMANCE STATUS: 0 - Asymptomatic  Vitals:   09/19/21 1244  BP: 120/65  Pulse: 69  Resp: 16  Temp: 97.7 F (36.5 C)  SpO2: 100%    Filed Weights   09/19/21 1244  Weight: 138 lb 14.4 oz (63 kg)   Physical Exam Constitutional:      Appearance: Normal appearance.  HENT:     Head: Normocephalic and atraumatic.  Pulmonary:     Effort: Pulmonary effort is normal.  Musculoskeletal:        General: No swelling.     Cervical back: Normal range of motion. No rigidity.  Lymphadenopathy:     Cervical: No cervical adenopathy.  Skin:    General: Skin is warm.  Neurological:     General: No focal deficit present.     Mental Status: She is alert.       LABORATORY DATA:  I have reviewed the data as listed Lab Results  Component Value Date   WBC 4.6 09/19/2021   HGB 10.9 (L) 09/19/2021   HCT 31.7 (L) 09/19/2021   MCV 89.0 09/19/2021   PLT 239 09/19/2021   Lab Results  Component Value Date   NA 135 09/19/2021   K 3.7 09/19/2021   CL 104 09/19/2021   CO2 28 09/19/2021    RADIOGRAPHIC STUDIES: I have personally reviewed the radiological reports and agreed with the findings in the report.  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    Given small size although HER2 amplified invasive ductal carcinoma, we have discussed to proceed with upfront surgery  followed by consideration for adjuvant Taxol with Herceptin. She is now status post lumpectomy and sentinel lymph node biopsy.  Final pathology showed poorly differentiated high-grade  1.2 cm IDC, negative margins, 0 out of 3 lymph nodes involved.    She is now on adjuvant Herceptin and Taxol weekly.  She is doing quite well.  No concerning adverse effects.  Physical examination unremarkable.  We have once again discussed about adverse effects of chemotherapy including neuropathy. She will return to clinic in 2 weeks for follow-up, continue weekly chemotherapy as scheduled.  She understands that Herceptin will continue for total of 1 year.  She declined cold cap.     Total time spent: 30 minutes including history, physical exam, review of records, counseling and coordination of care All questions were answered. The patient knows to call the clinic with any problems, questions or concerns.    Benay Pike, MD 09/19/21

## 2021-09-23 ENCOUNTER — Encounter: Payer: Self-pay | Admitting: Genetic Counselor

## 2021-09-23 ENCOUNTER — Telehealth: Payer: Self-pay | Admitting: Genetic Counselor

## 2021-09-23 NOTE — Telephone Encounter (Signed)
Patient LVM requesting copy of negative genetics results.  Uploaded to [facility] and sent to email via [facility] portal.  Called to notify patient.

## 2021-09-24 ENCOUNTER — Other Ambulatory Visit: Payer: Self-pay | Admitting: Cardiology

## 2021-09-24 DIAGNOSIS — I251 Atherosclerotic heart disease 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: Secondary | ICD-10-CM

## 2021-09-24 NOTE — Telephone Encounter (Signed)
Rx request sent to pharmacy.  

## 2021-09-25 ENCOUNTER — Telehealth: Payer: Self-pay | Admitting: Genetic Counselor

## 2021-09-25 MED FILL — Dexamethasone Sodium Phosphate Inj 100 MG/10ML: INTRAMUSCULAR | Qty: 1 | Status: AC

## 2021-09-25 NOTE — Telephone Encounter (Signed)
Patient called because OOP cost for genetic testing was higher than expected and does not remember being offered self pay price.  Message sent to lab to determine if there was an error.  Patient will be contacted with outcome.

## 2021-09-26 ENCOUNTER — Inpatient Hospital Stay: Payer: No Typology Code available for payment source

## 2021-09-26 ENCOUNTER — Other Ambulatory Visit: Payer: Self-pay

## 2021-09-26 VITALS — BP 132/66 | HR 60 | Temp 98.3°F | Resp 16 | Wt 141.5 lb

## 2021-09-26 DIAGNOSIS — Z5111 Encounter for antineoplastic chemotherapy: Secondary | ICD-10-CM | POA: Diagnosis not present

## 2021-09-26 DIAGNOSIS — Z170 Estrogen receptor positive status [ER+]: Secondary | ICD-10-CM

## 2021-09-26 DIAGNOSIS — Z95828 Presence of other vascular implants and grafts: Secondary | ICD-10-CM

## 2021-09-26 LAB — CBC WITH DIFFERENTIAL (CANCER CENTER ONLY)
Abs Immature Granulocytes: 0.02 10*3/uL (ref 0.00–0.07)
Basophils Absolute: 0 10*3/uL (ref 0.0–0.1)
Basophils Relative: 1 %
Eosinophils Absolute: 0.2 10*3/uL (ref 0.0–0.5)
Eosinophils Relative: 5 %
HCT: 30.6 % — ABNORMAL LOW (ref 36.0–46.0)
Hemoglobin: 10.6 g/dL — ABNORMAL LOW (ref 12.0–15.0)
Immature Granulocytes: 1 %
Lymphocytes Relative: 38 %
Lymphs Abs: 1.5 10*3/uL (ref 0.7–4.0)
MCH: 30.5 pg (ref 26.0–34.0)
MCHC: 34.6 g/dL (ref 30.0–36.0)
MCV: 88.2 fL (ref 80.0–100.0)
Monocytes Absolute: 0.2 10*3/uL (ref 0.1–1.0)
Monocytes Relative: 5 %
Neutro Abs: 2 10*3/uL (ref 1.7–7.7)
Neutrophils Relative %: 50 %
Platelet Count: 273 10*3/uL (ref 150–400)
RBC: 3.47 MIL/uL — ABNORMAL LOW (ref 3.87–5.11)
RDW: 11.9 % (ref 11.5–15.5)
WBC Count: 3.9 10*3/uL — ABNORMAL LOW (ref 4.0–10.5)
nRBC: 0 % (ref 0.0–0.2)

## 2021-09-26 LAB — CMP (CANCER CENTER ONLY)
ALT: 27 U/L (ref 0–44)
AST: 21 U/L (ref 15–41)
Albumin: 4.2 g/dL (ref 3.5–5.0)
Alkaline Phosphatase: 42 U/L (ref 38–126)
Anion gap: 4 — ABNORMAL LOW (ref 5–15)
BUN: 11 mg/dL (ref 8–23)
CO2: 28 mmol/L (ref 22–32)
Calcium: 8.9 mg/dL (ref 8.9–10.3)
Chloride: 106 mmol/L (ref 98–111)
Creatinine: 0.71 mg/dL (ref 0.44–1.00)
GFR, Estimated: >60 mL/min (ref >60)
Glucose, Bld: 146 mg/dL — ABNORMAL HIGH (ref 70–99)
Potassium: 3.6 mmol/L (ref 3.5–5.1)
Sodium: 138 mmol/L (ref 135–145)
Total Bilirubin: 0.9 mg/dL (ref 0.3–1.2)
Total Protein: 6.3 g/dL — ABNORMAL LOW (ref 6.5–8.1)

## 2021-09-26 MED ORDER — SODIUM CHLORIDE 0.9 % IV SOLN
80.0000 mg/m2 | Freq: Once | INTRAVENOUS | Status: AC
  Administered 2021-09-26: 132 mg via INTRAVENOUS
  Filled 2021-09-26: qty 22

## 2021-09-26 MED ORDER — SODIUM CHLORIDE 0.9% FLUSH
10.0000 mL | INTRAVENOUS | Status: DC | PRN
  Administered 2021-09-26: 10 mL

## 2021-09-26 MED ORDER — HEPARIN SOD (PORK) LOCK FLUSH 100 UNIT/ML IV SOLN
500.0000 [IU] | Freq: Once | INTRAVENOUS | Status: AC | PRN
  Administered 2021-09-26: 500 [IU]

## 2021-09-26 MED ORDER — ACETAMINOPHEN 325 MG PO TABS
650.0000 mg | ORAL_TABLET | Freq: Once | ORAL | Status: AC
  Administered 2021-09-26: 650 mg via ORAL
  Filled 2021-09-26: qty 2

## 2021-09-26 MED ORDER — SODIUM CHLORIDE 0.9 % IV SOLN
10.0000 mg | Freq: Once | INTRAVENOUS | Status: AC
  Administered 2021-09-26: 10 mg via INTRAVENOUS
  Filled 2021-09-26: qty 10

## 2021-09-26 MED ORDER — SODIUM CHLORIDE 0.9 % IV SOLN: Freq: Once | INTRAVENOUS | Status: AC

## 2021-09-26 MED ORDER — TRASTUZUMAB-DKST CHEMO 150 MG IV SOLR
2.0000 mg/kg | Freq: Once | INTRAVENOUS | Status: AC
  Administered 2021-09-26: 126 mg via INTRAVENOUS
  Filled 2021-09-26: qty 6

## 2021-09-26 MED ORDER — CETIRIZINE HCL 10 MG/ML IV SOLN
10.0000 mg | Freq: Once | INTRAVENOUS | Status: AC
  Administered 2021-09-26: 10 mg via INTRAVENOUS
  Filled 2021-09-26: qty 1

## 2021-09-26 MED ORDER — FAMOTIDINE IN NACL 20-0.9 MG/50ML-% IV SOLN
20.0000 mg | Freq: Once | INTRAVENOUS | Status: AC
  Administered 2021-09-26: 20 mg via INTRAVENOUS
  Filled 2021-09-26: qty 50

## 2021-09-26 MED ORDER — SODIUM CHLORIDE 0.9% FLUSH
10.0000 mL | Freq: Once | INTRAVENOUS | Status: AC
  Administered 2021-09-26: 10 mL

## 2021-09-26 NOTE — Patient Instructions (Signed)
[facility] ONCOL[facility]   Discharge Instructions: Thank you for choosing [facility] to provide your oncology and hematology care.   If you have a lab appointment with the [facility], please go directly to the [facility] and check in at the registration area.   Wear comfortable clothing and clothing appropriate for easy access to any Portacath or PICC line.   We strive to give you quality time with your provider. You may need to reschedule your appointment if you arrive late (15 or more minutes).  Arriving late affects you and other patients whose appointments are after yours.  Also, if you miss three or more appointments without notifying the office, you may be dismissed from the clinic at the provider's discretion.      For prescription refill requests, have your pharmacy contact our office and allow 72 hours for refills to be completed.    Today you received the following chemotherapy and/or immunotherapy agents: trastuzumab-dkst and paclitaxel      To help prevent nausea and vomiting after your treatment, we encourage you to take your nausea medication as directed.  BELOW ARE SYMPTOMS THAT SHOULD BE REPORTED IMMEDIATELY: *FEVER GREATER THAN 100.4 F (38 C) OR HIGHER *CHILLS OR SWEATING *NAUSEA AND VOMITING THAT IS NOT CONTROLLED WITH YOUR NAUSEA MEDICATION *UNUSUAL SHORTNESS OF BREATH *UNUSUAL BRUISING OR BLEEDING *URINARY PROBLEMS (pain or burning when urinating, or frequent urination) *BOWEL PROBLEMS (unusual diarrhea, constipation, pain near the anus) TENDERNESS IN MOUTH AND THROAT WITH OR WITHOUT PRESENCE OF ULCERS (sore throat, sores in mouth, or a toothache) UNUSUAL RASH, SWELLING OR PAIN  UNUSUAL VAGINAL DISCHARGE OR ITCHING   Items with * indicate a potential emergency and should be followed up as soon as possible or go to the Emergency Department if any problems should occur.  Please show the CHEMOTHERAPY ALERT CARD or IMMUNOTHERAPY  ALERT CARD at check-in to the Emergency Department and triage nurse.  Should you have questions after your visit or need to cancel or reschedule your appointment, please contact [facility]  Dept: [phone]  and follow the prompts.  Office hours are 8:00 a.m. to 4:30 p.m. Monday - Friday. Please note that voicemails left after 4:00 p.m. may not be returned until the following business day.  We are closed weekends and major holidays. You have access to a nurse at all times for urgent questions. Please call the main number to the clinic Dept: [phone] and follow the prompts.   For any non-urgent questions, you may also contact your provider using MyChart. We now offer e-Visits for anyone 59 and older to request care online for non-urgent symptoms. For details visit [email or web address].   Also download the MyChart app! Go to the app store, search "MyChart", open the app, select [facility], and log in with your MyChart username and password.  Masks are optional in the cancer centers. If you would like for your care team to wear a mask while they are taking care of you, please let them know. You may have one support person who is at least 61 years old accompany you for your appointments.

## 2021-10-02 MED FILL — Dexamethasone Sodium Phosphate Inj 100 MG/10ML: INTRAMUSCULAR | Qty: 1 | Status: AC

## 2021-10-03 ENCOUNTER — Inpatient Hospital Stay: Payer: No Typology Code available for payment source

## 2021-10-03 ENCOUNTER — Encounter: Payer: Self-pay | Admitting: Hematology and Oncology

## 2021-10-03 ENCOUNTER — Inpatient Hospital Stay (HOSPITAL_BASED_OUTPATIENT_CLINIC_OR_DEPARTMENT_OTHER): Payer: No Typology Code available for payment source | Admitting: Hematology and Oncology

## 2021-10-03 ENCOUNTER — Other Ambulatory Visit: Payer: Self-pay

## 2021-10-03 DIAGNOSIS — C50412 Malignant neoplasm of upper-outer quadrant of left female breast: Secondary | ICD-10-CM | POA: Diagnosis not present

## 2021-10-03 DIAGNOSIS — Z5111 Encounter for antineoplastic chemotherapy: Secondary | ICD-10-CM | POA: Diagnosis not present

## 2021-10-03 DIAGNOSIS — Z170 Estrogen receptor positive status [ER+]: Secondary | ICD-10-CM

## 2021-10-03 DIAGNOSIS — Z95828 Presence of other vascular implants and grafts: Secondary | ICD-10-CM

## 2021-10-03 LAB — CBC WITH DIFFERENTIAL (CANCER CENTER ONLY)
Abs Immature Granulocytes: 0.01 10*3/uL (ref 0.00–0.07)
Basophils Absolute: 0.1 10*3/uL (ref 0.0–0.1)
Basophils Relative: 1 %
Eosinophils Absolute: 0.2 10*3/uL (ref 0.0–0.5)
Eosinophils Relative: 4 %
HCT: 31.7 % — ABNORMAL LOW (ref 36.0–46.0)
Hemoglobin: 10.8 g/dL — ABNORMAL LOW (ref 12.0–15.0)
Immature Granulocytes: 0 %
Lymphocytes Relative: 38 %
Lymphs Abs: 1.7 10*3/uL (ref 0.7–4.0)
MCH: 30.3 pg (ref 26.0–34.0)
MCHC: 34.1 g/dL (ref 30.0–36.0)
MCV: 89 fL (ref 80.0–100.0)
Monocytes Absolute: 0.3 10*3/uL (ref 0.1–1.0)
Monocytes Relative: 7 %
Neutro Abs: 2.2 10*3/uL (ref 1.7–7.7)
Neutrophils Relative %: 50 %
Platelet Count: 311 10*3/uL (ref 150–400)
RBC: 3.56 MIL/uL — ABNORMAL LOW (ref 3.87–5.11)
RDW: 12.3 % (ref 11.5–15.5)
WBC Count: 4.3 10*3/uL (ref 4.0–10.5)
nRBC: 0 % (ref 0.0–0.2)

## 2021-10-03 LAB — CMP (CANCER CENTER ONLY)
ALT: 27 U/L (ref 0–44)
AST: 22 U/L (ref 15–41)
Albumin: 4.2 g/dL (ref 3.5–5.0)
Alkaline Phosphatase: 38 U/L (ref 38–126)
Anion gap: 3 — ABNORMAL LOW (ref 5–15)
BUN: 13 mg/dL (ref 8–23)
CO2: 30 mmol/L (ref 22–32)
Calcium: 8.8 mg/dL — ABNORMAL LOW (ref 8.9–10.3)
Chloride: 106 mmol/L (ref 98–111)
Creatinine: 0.74 mg/dL (ref 0.44–1.00)
GFR, Estimated: >60 mL/min (ref >60)
Glucose, Bld: 121 mg/dL — ABNORMAL HIGH (ref 70–99)
Potassium: 3.9 mmol/L (ref 3.5–5.1)
Sodium: 139 mmol/L (ref 135–145)
Total Bilirubin: 1 mg/dL (ref 0.3–1.2)
Total Protein: 6.5 g/dL (ref 6.5–8.1)

## 2021-10-03 MED ORDER — SODIUM CHLORIDE 0.9 % IV SOLN
10.0000 mg | Freq: Once | INTRAVENOUS | Status: AC
  Administered 2021-10-03: 10 mg via INTRAVENOUS
  Filled 2021-10-03: qty 10

## 2021-10-03 MED ORDER — ACETAMINOPHEN 325 MG PO TABS
650.0000 mg | ORAL_TABLET | Freq: Once | ORAL | Status: AC
  Administered 2021-10-03: 650 mg via ORAL
  Filled 2021-10-03: qty 2

## 2021-10-03 MED ORDER — CETIRIZINE HCL 10 MG/ML IV SOLN
10.0000 mg | Freq: Once | INTRAVENOUS | Status: AC
  Administered 2021-10-03: 10 mg via INTRAVENOUS
  Filled 2021-10-03: qty 1

## 2021-10-03 MED ORDER — SODIUM CHLORIDE 0.9% FLUSH
10.0000 mL | Freq: Once | INTRAVENOUS | Status: AC
  Administered 2021-10-03: 10 mL

## 2021-10-03 MED ORDER — SODIUM CHLORIDE 0.9 % IV SOLN
80.0000 mg/m2 | Freq: Once | INTRAVENOUS | Status: AC
  Administered 2021-10-03: 132 mg via INTRAVENOUS
  Filled 2021-10-03: qty 22

## 2021-10-03 MED ORDER — SODIUM CHLORIDE 0.9 % IV SOLN: Freq: Once | INTRAVENOUS | Status: AC

## 2021-10-03 MED ORDER — TRASTUZUMAB-DKST CHEMO 150 MG IV SOLR
2.0000 mg/kg | Freq: Once | INTRAVENOUS | Status: AC
  Administered 2021-10-03: 126 mg via INTRAVENOUS
  Filled 2021-10-03: qty 6

## 2021-10-03 MED ORDER — FAMOTIDINE IN NACL 20-0.9 MG/50ML-% IV SOLN
20.0000 mg | Freq: Once | INTRAVENOUS | Status: AC
  Administered 2021-10-03: 20 mg via INTRAVENOUS
  Filled 2021-10-03: qty 50

## 2021-10-03 NOTE — Progress Notes (Signed)
This is a 61 year old postmenopausal female patient with newly diagnosed left breast invasive ductal carcinoma [facility] NOTE  Patient Care Team: [name], MD as PCP - General (Internal Medicine) Buford Dresser, MD as PCP - Cardiology (Cardiology) Rockwell Germany, RN as Oncology Nurse Navigator Mauro Kaufmann, RN as Oncology Nurse Navigator Jovita Kussmaul, MD as Consulting Physician (General Surgery) Benay Pike, MD as Consulting Physician (Hematology and Oncology) Kyung Rudd, MD as Consulting Physician (Radiation Oncology)  CHIEF COMPLAINTS/PURPOSE OF CONSULTATION:  Newly diagnosed breast cancer  HISTORY OF PRESENTING ILLNESS:  Christine [name] 61 y.o. female is here because of recent diagnosis of left breast cancer  I reviewed her records extensively and collaborated the history with the patient.  SUMMARY OF ONCOLOGIC HISTORY: Oncology History  Malignant neoplasm of upper-outer quadrant of left breast in female, estrogen receptor positive ([place])  07/17/2021 Mammogram   Suspicious left breast mass at 3 o'clock, 4 cm from the nipple. No axillary adenopathy. Targeted ultrasound is performed, showing an irregular centrally hypoechoic mass with an echogenic rim measuring 7 x 5 by 7 mm, thought to correlate with the mammographically identified mass. This mass is located at 3 o'clock, 4 cm from the nipple. No axillary adenopathy.   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female, estrogen receptor positive ([place])   07/29/2021 Cancer Staging   Staging form: Breast, AJCC 8th Edition - Clinical stage from 07/29/2021: Stage IA (cT1c, cN0, cM0, G3, ER+, PR-, HER2+) - Signed by Hayden Pedro, PA-C on 07/31/2021 Stage prefix: Initial diagnosis Method of lymph node assessment: Clinical Histologic grading system: 3 grade system    Pathology Results   Path showed IDC, grade 3, ER 100%  positive, strong staining intensity, PR negative, ki 67 35%, Her 2 positive   08/05/2021 Genetic Testing   Negative hereditary cancer genetic testing: no pathogenic variants detected in [facility] BRCAPlus Panel and [facility] CustomNext-Cancer +RNAinsight Panel.  Report dates are August 05, 2021 and August 08, 2021.    The BRCAplus panel offered by [facility] [employer] and includes sequencing and deletion/duplication analysis for the following 8 genes: ATM, BRCA1, BRCA2, CDH1, CHEK2, PALB2, PTEN, and TP53. The CustomNext-Cancer+RNAinsight panel offered by [facility] includes sequencing and rearrangement analysis for the following 47 genes:  APC, ATM, AXIN2, BARD1, BMPR1A, BRCA1, BRCA2, BRIP1, CDH1, CDK4, CDKN2A, CHEK2, DICER1, EPCAM, GREM1, HOXB13, MEN1, MLH1, MSH2, MSH3, MSH6, MUTYH, NBN, NF1, NF2, NTHL1, PALB2, PMS2, POLD1, POLE, PTEN, RAD51C, RAD51D, RECQL, RET, SDHA, SDHAF2, SDHB, SDHC, SDHD, SMAD4, SMARCA4, STK11, TP53, TSC1, TSC2, and VHL.  RNA data is routinely analyzed for use in variant interpretation for all genes.   09/12/2021 -  Chemotherapy   Patient is on Treatment Plan : BREAST Paclitaxel + Trastuzumab q7d / Trastuzumab q21d      She is here for follow-up with her husband.   She has so far received 3 weekly cycles of Taxol and Herceptin and she has been feeling very well.  She denies any complaints at all.  She has noticed some hair shedding but otherwise no problems.  No neuropathy reported.  Rest of the pertinent 10 point ROS reviewed and negative.  MEDICAL HISTORY:  Past Medical History:  Diagnosis Date   Family history of breast cancer 07/31/2021   GERD (gastroesophageal reflux disease)    Hypertension     SURGICAL HISTORY: Past Surgical History:  Procedure Laterality Date   BREAST LUMPECTOMY WITH RADIOACTIVE SEED AND SENTINEL  LYMPH NODE BIOPSY Left 08/09/2021   Procedure: LEFT BREAST RADIOACTIVE SEED LOCALIZED LUMPECTOMY AND SENTINEL NODE BIOPSY;  Surgeon: Jovita Kussmaul, MD;  Location: [facility];  Service: General;  Laterality: Left;  GEN & PEC BLOCK   CESAREAN SECTION     FOOT SURGERY Right    PORTACATH PLACEMENT Right 08/09/2021   Procedure: PORT PLACEMENT RIGHT  INTERNAL JUGULAR  WITH ULTRASOUND GUIDANCE;  Surgeon: Jovita Kussmaul, MD;  Location: [facility];  Service: General;  Laterality: Right;  RIGHT INTERNAL JUGULAR PLACEMENT    SOCIAL HISTORY: Social History   Socioeconomic History   Marital status: Married    Spouse name: Not on file   Number of children: Not on file   Years of education: Not on file   Highest education level: Not on file  Occupational History   Not on file  Tobacco Use   Smoking status: Never   Smokeless tobacco: Never  Vaping Use   Vaping Use: Never used  Substance and Sexual Activity   Alcohol use: Yes    Alcohol/week: 2.0 standard drinks of alcohol    Types: 2 Glasses of wine per week   Drug use: Never   Sexual activity: Not on file  Other Topics Concern   Not on file  Social History Narrative   Not on file   Social Determinants of Health   Financial Resource Strain: Not on file  Food Insecurity: Not on file  Transportation Needs: Not on file  Physical Activity: Not on file  Stress: Not on file  Social Connections: Not on file  Intimate Partner Violence: Not on file    FAMILY HISTORY: Family History  Problem Relation Age of Onset   Heart disease Father    Breast cancer Sister 72   Esophageal cancer Maternal Uncle        dx after age 73   Lung cancer Paternal Uncle    Skin cancer Paternal Uncle     ALLERGIES:  is allergic to lisinopril and mefloquine.  MEDICATIONS:  Current Outpatient Medications  Medication Sig Dispense Refill   acetaminophen (TYLENOL) 500 MG tablet Take 1,000 mg by mouth every 6 (six) hours as needed for moderate pain.     aspirin EC 81 MG tablet Take 81 mg by mouth daily. Swallow whole.     Cholecalciferol (DIALYVITE VITAMIN D 5000) 125 MCG (5000 UT) capsule Take 15,000 Units by mouth every Monday.      lidocaine-prilocaine (EMLA) cream Apply to affected area once 30 g 3   MAGNESIUM PO Take 150 mg by mouth 3 (three) times a week.     nitroGLYCERIN (NITROSTAT) 0.4 MG SL tablet Place 1 tablet (0.4 mg total) under the tongue every 5 (five) minutes as needed for chest pain. 90 tablet 3   Omega-3 Fatty Acids (OMEGA 3 500) 500 MG CAPS Take 500 mg by mouth daily.     omeprazole (PRILOSEC) 40 MG capsule Take 40 mg by mouth every evening.     ondansetron (ZOFRAN) 8 MG tablet Take 1 tablet (8 mg total) by mouth every 8 (eight) hours as needed for nausea or vomiting. 30 tablet 1   oxyCODONE (ROXICODONE) 5 MG immediate release tablet Take 1 tablet (5 mg total) by mouth every 6 (six) hours as needed for severe pain. 15 tablet 0   prochlorperazine (COMPAZINE) 10 MG tablet Take 1 tablet (10 mg total) by mouth every 6 (six) hours as needed for nausea or vomiting. 30 tablet 1   rosuvastatin (  CRESTOR) 40 MG tablet TAKE 1 TABLET BY MOUTH EVERY DAY 90 tablet 1   valsartan-hydrochlorothiazide (DIOVAN-HCT) 80-12.5 MG tablet Take 1 tablet by mouth daily. 90 tablet 3   zolpidem (AMBIEN) 10 MG tablet Take 5-10 mg by mouth at bedtime as needed for sleep.     No current facility-administered medications for this visit.   Facility-Administered Medications Ordered in Other Visits  Medication Dose Route Frequency Provider Last Rate Last Admin   PACLitaxel (TAXOL) 132 mg in sodium chloride 0.9 % 250 mL chemo infusion (</= 29m/m2)  80 mg/m2 (Treatment Plan Recorded) Intravenous Once Aloura Matsuoka, MD       trastuzumab-dkst (OGIVRI) 126 mg in sodium chloride 0.9 % 250 mL chemo infusion  2 mg/kg (Treatment Plan Recorded) Intravenous Once Boniface Goffe, PArletha Pili MD       PHYSICAL EXAMINATION: ECOG PERFORMANCE STATUS: 0 - Asymptomatic  Vitals:   10/03/21 1352  BP: (!) 120/50  Pulse: 73  Resp: 16  Temp: 98.1 F (36.7 C)  SpO2: 100%    Filed Weights   10/03/21 1352  Weight: 140 lb 8 oz (63.7 kg)   Physical  Exam Constitutional:      Appearance: Normal appearance.  HENT:     Head: Normocephalic and atraumatic.  Pulmonary:     Effort: Pulmonary effort is normal.  Musculoskeletal:        General: No swelling.     Cervical back: Normal range of motion. No rigidity.  Lymphadenopathy:     Cervical: No cervical adenopathy.  Skin:    General: Skin is warm.  Neurological:     General: No focal deficit present.     Mental Status: She is alert.       LABORATORY DATA:  I have reviewed the data as listed Lab Results  Component Value Date   WBC 4.3 10/03/2021   HGB 10.8 (L) 10/03/2021   HCT 31.7 (L) 10/03/2021   MCV 89.0 10/03/2021   PLT 311 10/03/2021   Lab Results  Component Value Date   NA 139 10/03/2021   K 3.9 10/03/2021   CL 106 10/03/2021   CO2 30 10/03/2021    RADIOGRAPHIC STUDIES: I have personally reviewed the radiological reports and agreed with the findings in the report.  ASSESSMENT AND PLAN:   Malignant neoplasm of upper-outer quadrant of left breast in female, [place] positive [place] This is a very pleasant 61year old postmenopausal female patient with newly diagnosed left breast invasive ductal carcinoma, grade 3, ER 100% positive strong staining PR 0% negative, Ki-67 of 35% and HER2 positive by FISH [place]    Given small size although HER2 amplified invasive ductal carcinoma, we have discussed to proceed with upfront surgery followed by consideration for adjuvant Taxol with Herceptin. She is now status post lumpectomy and sentinel lymph node biopsy.  Final pathology showed poorly differentiated high-grade 1.2 cm IDC, negative margins, 0 out of 3 lymph nodes involved.    She is now on adjuvant Herceptin and Taxol weekly.   She is tolerating chemotherapy very well.  No concerns today.  Physical examination unremarkable.  Okay to proceed with chemo if labs are all within parameters.  She will return to clinic  in 3 to 4 weeks    Total time spent: 20 minutes including history, physical exam, review of records, counseling and coordination of care All questions were answered. The patient knows to call the clinic with any problems, questions or concerns.    PBenay Pike  MD 10/03/21

## 2021-10-03 NOTE — Assessment & Plan Note (Signed)
This is a very pleasant 61 year old postmenopausal female patient with newly diagnosed left breast invasive ductal carcinoma, grade 3, ER 100% positive strong staining PR 0% negative, Ki-67 of 35% and HER2 positive by FISH referred to breast [facility] for additional recommendations.    Given small size although HER2 amplified invasive ductal carcinoma, we have discussed to proceed with upfront surgery followed by consideration for adjuvant Taxol with Herceptin. She is now status post lumpectomy and sentinel lymph node biopsy.  Final pathology showed poorly differentiated high-grade 1.2 cm IDC, negative margins, 0 out of 3 lymph nodes involved.    She is now on adjuvant Herceptin and Taxol weekly.   She is tolerating chemotherapy very well.  No concerns today.  Physical examination unremarkable.  Okay to proceed with chemo if labs are all within parameters.  She will return to clinic in 3 to 4 weeks

## 2021-10-07 ENCOUNTER — Other Ambulatory Visit: Payer: Self-pay | Admitting: Hematology and Oncology

## 2021-10-07 DIAGNOSIS — Z170 Estrogen receptor positive status [ER+]: Secondary | ICD-10-CM

## 2021-10-08 ENCOUNTER — Telehealth: Payer: Self-pay | Admitting: Genetic Counselor

## 2021-10-08 NOTE — Telephone Encounter (Signed)
Patient called about bill from [employer] [employer].  Christine [name] stated that they contacted patient on 7/5 and 7/7.  Proceeded with billing through insurance as they didn't receive communication from her beforehand.  Provided [facility] billing number to discuss PAP if interested.

## 2021-10-09 ENCOUNTER — Other Ambulatory Visit: Payer: Self-pay

## 2021-10-09 MED FILL — Dexamethasone Sodium Phosphate Inj 100 MG/10ML: INTRAMUSCULAR | Qty: 1 | Status: AC

## 2021-10-10 ENCOUNTER — Inpatient Hospital Stay: Payer: No Typology Code available for payment source

## 2021-10-10 ENCOUNTER — Inpatient Hospital Stay: Payer: No Typology Code available for payment source | Attending: Hematology and Oncology

## 2021-10-10 ENCOUNTER — Other Ambulatory Visit: Payer: Self-pay

## 2021-10-10 VITALS — BP 120/61 | HR 67 | Temp 98.6°F | Resp 18 | Wt 140.8 lb

## 2021-10-10 DIAGNOSIS — Z808 Family history of malignant neoplasm of other organs or systems: Secondary | ICD-10-CM | POA: Diagnosis not present

## 2021-10-10 DIAGNOSIS — Z7982 Long term (current) use of aspirin: Secondary | ICD-10-CM | POA: Insufficient documentation

## 2021-10-10 DIAGNOSIS — Z801 Family history of malignant neoplasm of trachea, bronchus and lung: Secondary | ICD-10-CM | POA: Diagnosis not present

## 2021-10-10 DIAGNOSIS — D6481 Anemia due to antineoplastic chemotherapy: Secondary | ICD-10-CM | POA: Insufficient documentation

## 2021-10-10 DIAGNOSIS — I10 Essential (primary) hypertension: Secondary | ICD-10-CM | POA: Insufficient documentation

## 2021-10-10 DIAGNOSIS — Z170 Estrogen receptor positive status [ER+]: Secondary | ICD-10-CM | POA: Insufficient documentation

## 2021-10-10 DIAGNOSIS — Z5111 Encounter for antineoplastic chemotherapy: Secondary | ICD-10-CM | POA: Diagnosis present

## 2021-10-10 DIAGNOSIS — K219 Gastro-esophageal reflux disease without esophagitis: Secondary | ICD-10-CM | POA: Diagnosis not present

## 2021-10-10 DIAGNOSIS — Z79899 Other long term (current) drug therapy: Secondary | ICD-10-CM | POA: Diagnosis not present

## 2021-10-10 DIAGNOSIS — T451X5A Adverse effect of antineoplastic and immunosuppressive drugs, initial encounter: Secondary | ICD-10-CM | POA: Insufficient documentation

## 2021-10-10 DIAGNOSIS — C50412 Malignant neoplasm of upper-outer quadrant of left female breast: Secondary | ICD-10-CM | POA: Diagnosis not present

## 2021-10-10 DIAGNOSIS — Z95828 Presence of other vascular implants and grafts: Secondary | ICD-10-CM

## 2021-10-10 DIAGNOSIS — Z803 Family history of malignant neoplasm of breast: Secondary | ICD-10-CM | POA: Insufficient documentation

## 2021-10-10 LAB — CBC WITH DIFFERENTIAL (CANCER CENTER ONLY)
Abs Immature Granulocytes: 0.01 10*3/uL (ref 0.00–0.07)
Basophils Absolute: 0 10*3/uL (ref 0.0–0.1)
Basophils Relative: 1 %
Eosinophils Absolute: 0.1 10*3/uL (ref 0.0–0.5)
Eosinophils Relative: 2 %
HCT: 31.3 % — ABNORMAL LOW (ref 36.0–46.0)
Hemoglobin: 11 g/dL — ABNORMAL LOW (ref 12.0–15.0)
Immature Granulocytes: 0 %
Lymphocytes Relative: 38 %
Lymphs Abs: 1.4 10*3/uL (ref 0.7–4.0)
MCH: 30.8 pg (ref 26.0–34.0)
MCHC: 35.1 g/dL (ref 30.0–36.0)
MCV: 87.7 fL (ref 80.0–100.0)
Monocytes Absolute: 0.3 10*3/uL (ref 0.1–1.0)
Monocytes Relative: 7 %
Neutro Abs: 2 10*3/uL (ref 1.7–7.7)
Neutrophils Relative %: 52 %
Platelet Count: 277 10*3/uL (ref 150–400)
RBC: 3.57 MIL/uL — ABNORMAL LOW (ref 3.87–5.11)
RDW: 12.7 % (ref 11.5–15.5)
WBC Count: 3.8 10*3/uL — ABNORMAL LOW (ref 4.0–10.5)
nRBC: 0 % (ref 0.0–0.2)

## 2021-10-10 LAB — CMP (CANCER CENTER ONLY)
ALT: 26 U/L (ref 0–44)
AST: 21 U/L (ref 15–41)
Albumin: 4.3 g/dL (ref 3.5–5.0)
Alkaline Phosphatase: 38 U/L (ref 38–126)
Anion gap: 6 (ref 5–15)
BUN: 14 mg/dL (ref 8–23)
CO2: 28 mmol/L (ref 22–32)
Calcium: 9.2 mg/dL (ref 8.9–10.3)
Chloride: 104 mmol/L (ref 98–111)
Creatinine: 0.66 mg/dL (ref 0.44–1.00)
GFR, Estimated: >60 mL/min (ref >60)
Glucose, Bld: 98 mg/dL (ref 70–99)
Potassium: 3.8 mmol/L (ref 3.5–5.1)
Sodium: 138 mmol/L (ref 135–145)
Total Bilirubin: 1.3 mg/dL — ABNORMAL HIGH (ref 0.3–1.2)
Total Protein: 6.7 g/dL (ref 6.5–8.1)

## 2021-10-10 MED ORDER — SODIUM CHLORIDE 0.9 % IV SOLN
10.0000 mg | Freq: Once | INTRAVENOUS | Status: AC
  Administered 2021-10-10: 10 mg via INTRAVENOUS
  Filled 2021-10-10: qty 10

## 2021-10-10 MED ORDER — DIPHENHYDRAMINE HCL 50 MG/ML IJ SOLN
50.0000 mg | Freq: Once | INTRAMUSCULAR | Status: AC
  Administered 2021-10-10: 50 mg via INTRAVENOUS
  Filled 2021-10-10: qty 1

## 2021-10-10 MED ORDER — FAMOTIDINE IN NACL 20-0.9 MG/50ML-% IV SOLN
20.0000 mg | Freq: Once | INTRAVENOUS | Status: AC
  Administered 2021-10-10: 20 mg via INTRAVENOUS
  Filled 2021-10-10: qty 50

## 2021-10-10 MED ORDER — SODIUM CHLORIDE 0.9% FLUSH
10.0000 mL | INTRAVENOUS | Status: DC | PRN
  Administered 2021-10-10: 10 mL

## 2021-10-10 MED ORDER — SODIUM CHLORIDE 0.9 % IV SOLN
80.0000 mg/m2 | Freq: Once | INTRAVENOUS | Status: AC
  Administered 2021-10-10: 138 mg via INTRAVENOUS
  Filled 2021-10-10: qty 23

## 2021-10-10 MED ORDER — SODIUM CHLORIDE 0.9% FLUSH
10.0000 mL | Freq: Once | INTRAVENOUS | Status: AC
  Administered 2021-10-10: 10 mL

## 2021-10-10 MED ORDER — HEPARIN SOD (PORK) LOCK FLUSH 100 UNIT/ML IV SOLN
500.0000 [IU] | Freq: Once | INTRAVENOUS | Status: AC | PRN
  Administered 2021-10-10: 500 [IU]

## 2021-10-10 MED ORDER — ACETAMINOPHEN 325 MG PO TABS
650.0000 mg | ORAL_TABLET | Freq: Once | ORAL | Status: AC
  Administered 2021-10-10: 650 mg via ORAL
  Filled 2021-10-10: qty 2

## 2021-10-10 MED ORDER — TRASTUZUMAB-DKST CHEMO 150 MG IV SOLR
2.0000 mg/kg | Freq: Once | INTRAVENOUS | Status: AC
  Administered 2021-10-10: 126 mg via INTRAVENOUS
  Filled 2021-10-10: qty 6

## 2021-10-10 MED ORDER — SODIUM CHLORIDE 0.9 % IV SOLN: Freq: Once | INTRAVENOUS | Status: AC

## 2021-10-11 ENCOUNTER — Other Ambulatory Visit: Payer: Self-pay

## 2021-10-17 ENCOUNTER — Inpatient Hospital Stay: Payer: No Typology Code available for payment source

## 2021-10-17 ENCOUNTER — Inpatient Hospital Stay: Payer: No Typology Code available for payment source | Admitting: Adult Health

## 2021-10-17 MED FILL — Dexamethasone Sodium Phosphate Inj 100 MG/10ML: INTRAMUSCULAR | Qty: 1 | Status: AC

## 2021-10-18 ENCOUNTER — Inpatient Hospital Stay: Payer: No Typology Code available for payment source

## 2021-10-18 ENCOUNTER — Other Ambulatory Visit: Payer: Self-pay | Admitting: *Deleted

## 2021-10-18 ENCOUNTER — Encounter: Payer: Self-pay | Admitting: Hematology and Oncology

## 2021-10-18 ENCOUNTER — Other Ambulatory Visit: Payer: Self-pay

## 2021-10-18 ENCOUNTER — Inpatient Hospital Stay (HOSPITAL_BASED_OUTPATIENT_CLINIC_OR_DEPARTMENT_OTHER): Payer: No Typology Code available for payment source | Admitting: Hematology and Oncology

## 2021-10-18 VITALS — BP 128/63 | HR 62 | Temp 97.7°F | Resp 17

## 2021-10-18 DIAGNOSIS — C50412 Malignant neoplasm of upper-outer quadrant of left female breast: Secondary | ICD-10-CM

## 2021-10-18 DIAGNOSIS — D6481 Anemia due to antineoplastic chemotherapy: Secondary | ICD-10-CM

## 2021-10-18 DIAGNOSIS — T451X5A Adverse effect of antineoplastic and immunosuppressive drugs, initial encounter: Secondary | ICD-10-CM | POA: Diagnosis not present

## 2021-10-18 DIAGNOSIS — Z5111 Encounter for antineoplastic chemotherapy: Secondary | ICD-10-CM | POA: Diagnosis not present

## 2021-10-18 DIAGNOSIS — Z170 Estrogen receptor positive status [ER+]: Secondary | ICD-10-CM | POA: Diagnosis not present

## 2021-10-18 LAB — CBC WITH DIFFERENTIAL (CANCER CENTER ONLY)
Abs Immature Granulocytes: 0.02 10*3/uL (ref 0.00–0.07)
Basophils Absolute: 0 10*3/uL (ref 0.0–0.1)
Basophils Relative: 1 %
Eosinophils Absolute: 0.1 10*3/uL (ref 0.0–0.5)
Eosinophils Relative: 4 %
HCT: 31 % — ABNORMAL LOW (ref 36.0–46.0)
Hemoglobin: 10.6 g/dL — ABNORMAL LOW (ref 12.0–15.0)
Immature Granulocytes: 1 %
Lymphocytes Relative: 34 %
Lymphs Abs: 1.1 10*3/uL (ref 0.7–4.0)
MCH: 30.6 pg (ref 26.0–34.0)
MCHC: 34.2 g/dL (ref 30.0–36.0)
MCV: 89.6 fL (ref 80.0–100.0)
Monocytes Absolute: 0.3 10*3/uL (ref 0.1–1.0)
Monocytes Relative: 8 %
Neutro Abs: 1.6 10*3/uL — ABNORMAL LOW (ref 1.7–7.7)
Neutrophils Relative %: 52 %
Platelet Count: 279 10*3/uL (ref 150–400)
RBC: 3.46 MIL/uL — ABNORMAL LOW (ref 3.87–5.11)
RDW: 13.1 % (ref 11.5–15.5)
WBC Count: 3.1 10*3/uL — ABNORMAL LOW (ref 4.0–10.5)
nRBC: 0 % (ref 0.0–0.2)

## 2021-10-18 LAB — CMP (CANCER CENTER ONLY)
ALT: 32 U/L (ref 0–44)
AST: 26 U/L (ref 15–41)
Albumin: 3.9 g/dL (ref 3.5–5.0)
Alkaline Phosphatase: 39 U/L (ref 38–126)
Anion gap: 5 (ref 5–15)
BUN: 15 mg/dL (ref 8–23)
CO2: 26 mmol/L (ref 22–32)
Calcium: 8.8 mg/dL — ABNORMAL LOW (ref 8.9–10.3)
Chloride: 107 mmol/L (ref 98–111)
Creatinine: 0.64 mg/dL (ref 0.44–1.00)
GFR, Estimated: >60 mL/min (ref >60)
Glucose, Bld: 99 mg/dL (ref 70–99)
Potassium: 3.8 mmol/L (ref 3.5–5.1)
Sodium: 138 mmol/L (ref 135–145)
Total Bilirubin: 1 mg/dL (ref 0.3–1.2)
Total Protein: 6.5 g/dL (ref 6.5–8.1)

## 2021-10-18 MED ORDER — SODIUM CHLORIDE 0.9 % IV SOLN: Freq: Once | INTRAVENOUS | Status: AC

## 2021-10-18 MED ORDER — SODIUM CHLORIDE 0.9% FLUSH
10.0000 mL | INTRAVENOUS | Status: DC | PRN
  Administered 2021-10-18: 10 mL

## 2021-10-18 MED ORDER — CETIRIZINE HCL 10 MG/ML IV SOLN
10.0000 mg | Freq: Once | INTRAVENOUS | Status: AC
  Administered 2021-10-18: 10 mg via INTRAVENOUS
  Filled 2021-10-18: qty 1

## 2021-10-18 MED ORDER — FAMOTIDINE IN NACL 20-0.9 MG/50ML-% IV SOLN
20.0000 mg | Freq: Once | INTRAVENOUS | Status: AC
  Administered 2021-10-18: 20 mg via INTRAVENOUS
  Filled 2021-10-18: qty 50

## 2021-10-18 MED ORDER — ACETAMINOPHEN 325 MG PO TABS
650.0000 mg | ORAL_TABLET | Freq: Once | ORAL | Status: AC
  Administered 2021-10-18: 650 mg via ORAL
  Filled 2021-10-18: qty 2

## 2021-10-18 MED ORDER — HEPARIN SOD (PORK) LOCK FLUSH 100 UNIT/ML IV SOLN
500.0000 [IU] | Freq: Once | INTRAVENOUS | Status: AC | PRN
  Administered 2021-10-18: 500 [IU]

## 2021-10-18 MED ORDER — TRASTUZUMAB-DKST CHEMO 150 MG IV SOLR
2.0000 mg/kg | Freq: Once | INTRAVENOUS | Status: AC
  Administered 2021-10-18: 126 mg via INTRAVENOUS
  Filled 2021-10-18: qty 6

## 2021-10-18 MED ORDER — SODIUM CHLORIDE 0.9 % IV SOLN
10.0000 mg | Freq: Once | INTRAVENOUS | Status: AC
  Administered 2021-10-18: 10 mg via INTRAVENOUS
  Filled 2021-10-18: qty 10

## 2021-10-18 MED ORDER — SODIUM CHLORIDE 0.9 % IV SOLN
80.0000 mg/m2 | Freq: Once | INTRAVENOUS | Status: AC
  Administered 2021-10-18: 138 mg via INTRAVENOUS
  Filled 2021-10-18: qty 23

## 2021-10-18 NOTE — Assessment & Plan Note (Signed)
Mild, no indication for transfusion

## 2021-10-18 NOTE — Assessment & Plan Note (Signed)
This is a very pleasant 61 year old postmenopausal female patient with newly diagnosed left breast invasive ductal carcinoma, grade 3, ER 100% positive strong staining PR 0% negative, Ki-67 of 35% and HER2 positive by FISH referred to breast [place] for additional recommendations.    Given small size although HER2 amplified invasive ductal carcinoma, we have discussed to proceed with upfront surgery followed by consideration for adjuvant Taxol with Herceptin. She is now status post lumpectomy and sentinel lymph node biopsy.  Final pathology showed poorly differentiated high-grade 1.2 cm IDC, negative margins, 0 out of 3 lymph nodes involved.    She is now on adjuvant Herceptin and Taxol weekly.   She is tolerating chemotherapy very well.  No dose-limiting toxicity noted.  Physical examination unremarkable.  She will proceed with chemo as planned and will return to clinic for an appointment in 2 weeks, will continue weekly chemotherapy as a scheduled.

## 2021-10-18 NOTE — Progress Notes (Signed)
This is a 61 year old postmenopausal female patient with newly diagnosed left breast invasive ductal carcinoma [facility] [place] NOTE  Patient Care Team: [name], MD as PCP - General (Internal Medicine) Buford Dresser, MD as PCP - Cardiology (Cardiology) Rockwell Germany, RN as Oncology Nurse Navigator Mauro Kaufmann, RN as Oncology Nurse Navigator Jovita Kussmaul, MD as Consulting Physician (General Surgery) Benay Pike, MD as Consulting Physician (Hematology and Oncology) Kyung Rudd, MD as Consulting Physician (Radiation Oncology)  CHIEF COMPLAINTS/PURPOSE OF CONSULTATION:  Newly diagnosed breast cancer  HISTORY OF PRESENTING ILLNESS:  Christine [name] 61 y.o. female is here because of recent diagnosis of left breast cancer  I reviewed her records extensively and collaborated the history with the patient.  SUMMARY OF ONCOLOGIC HISTORY: Oncology History  Malignant neoplasm of upper-outer quadrant of left breast in female, estrogen receptor positive ([place])  07/17/2021 Mammogram   Suspicious left breast mass at 3 o'clock, 4 cm from the nipple. No axillary adenopathy. Targeted ultrasound is performed, showing an irregular centrally hypoechoic mass with an echogenic rim measuring 7 x 5 by 7 mm, thought to correlate with the mammographically identified mass. This mass is located at 3 o'clock, 4 cm from the nipple. No axillary adenopathy.   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female, estrogen receptor positive ([place])   07/29/2021 Cancer Staging   Staging form: Breast, AJCC 8th Edition - Clinical stage from 07/29/2021: Stage IA (cT1c, cN0, cM0, G3, ER+, PR-, HER2+) - Signed by Hayden Pedro, PA-C on 07/31/2021 Stage prefix: Initial diagnosis Method of lymph node assessment: Clinical Histologic grading system: 3 grade system    Pathology Results   Path showed IDC, grade 3, ER 100%  positive, strong staining intensity, PR negative, ki 67 35%, Her 2 positive   08/05/2021 Genetic Testing   Negative hereditary cancer genetic testing: no pathogenic variants detected in [facility] BRCAPlus Panel and [facility] CustomNext-Cancer +RNAinsight Panel.  Report dates are August 05, 2021 and August 08, 2021.    The BRCAplus panel offered by [employer] and includes sequencing and deletion/duplication analysis for the following 8 genes: ATM, BRCA1, BRCA2, CDH1, CHEK2, PALB2, PTEN, and TP53. The CustomNext-Cancer+RNAinsight panel offered by [facility] includes sequencing and rearrangement analysis for the following 47 genes:  APC, ATM, AXIN2, BARD1, BMPR1A, BRCA1, BRCA2, BRIP1, CDH1, CDK4, CDKN2A, CHEK2, DICER1, EPCAM, GREM1, HOXB13, MEN1, MLH1, MSH2, MSH3, MSH6, MUTYH, NBN, NF1, NF2, NTHL1, PALB2, PMS2, POLD1, POLE, PTEN, RAD51C, RAD51D, RECQL, RET, SDHA, SDHAF2, SDHB, SDHC, SDHD, SMAD4, SMARCA4, STK11, TP53, TSC1, TSC2, and VHL.  RNA data is routinely analyzed for use in variant interpretation for all genes.   09/12/2021 - 10/03/2021 Chemotherapy   Patient is on Treatment Plan : BREAST Paclitaxel + Trastuzumab q7d / Trastuzumab q21d     09/12/2021 -  Chemotherapy   Patient is on Treatment Plan : BREAST Paclitaxel + Trastuzumab q7d / Trastuzumab q21d      She is here for follow-up with her husband.   She has so far received 6 weekly cycles of Taxol and Herceptin and she has been feeling very well.   Since last visit, she has been doing well except for hair loss.  No nausea, vomiting, diarrhea or neuropathy.  She overall has noticed some increase sensitivity in her mouth but otherwise denies any complaints.   Rest of the pertinent 10 point ROS reviewed and negative.  MEDICAL HISTORY:  Past Medical History:  Diagnosis Date  Family history of breast cancer 07/31/2021   GERD (gastroesophageal reflux disease)    Hypertension     SURGICAL HISTORY: Past Surgical History:  Procedure Laterality Date    BREAST LUMPECTOMY WITH RADIOACTIVE SEED AND SENTINEL LYMPH NODE BIOPSY Left 08/09/2021   Procedure: LEFT BREAST RADIOACTIVE SEED LOCALIZED LUMPECTOMY AND SENTINEL NODE BIOPSY;  Surgeon: Jovita Kussmaul, MD;  Location: [facility];  Service: General;  Laterality: Left;  GEN & PEC BLOCK   CESAREAN SECTION     FOOT SURGERY Right    PORTACATH PLACEMENT Right 08/09/2021   Procedure: PORT PLACEMENT RIGHT  INTERNAL JUGULAR  WITH ULTRASOUND GUIDANCE;  Surgeon: Jovita Kussmaul, MD;  Location: [facility];  Service: General;  Laterality: Right;  RIGHT INTERNAL JUGULAR PLACEMENT    SOCIAL HISTORY: Social History   Socioeconomic History   Marital status: Married    Spouse name: Not on file   Number of children: Not on file   Years of education: Not on file   Highest education level: Not on file  Occupational History   Not on file  Tobacco Use   Smoking status: Never   Smokeless tobacco: Never  Vaping Use   Vaping Use: Never used  Substance and Sexual Activity   Alcohol use: Yes    Alcohol/week: 2.0 standard drinks of alcohol    Types: 2 Glasses of wine per week   Drug use: Never   Sexual activity: Not on file  Other Topics Concern   Not on file  Social History Narrative   Not on file   Social Determinants of Health   Financial Resource Strain: Not on file  Food Insecurity: Not on file  Transportation Needs: Not on file  Physical Activity: Not on file  Stress: Not on file  Social Connections: Not on file  Intimate Partner Violence: Not on file    FAMILY HISTORY: Family History  Problem Relation Age of Onset   Heart disease Father    Breast cancer Sister 37   Esophageal cancer Maternal Uncle        dx after age 65   Lung cancer Paternal Uncle    Skin cancer Paternal Uncle     ALLERGIES:  is allergic to lisinopril and mefloquine.  MEDICATIONS:  Current Outpatient Medications  Medication Sig Dispense Refill   acetaminophen (TYLENOL) 500 MG tablet Take 1,000 mg by mouth every 6 (six)  hours as needed for moderate pain.     aspirin EC 81 MG tablet Take 81 mg by mouth daily. Swallow whole.     Cholecalciferol (DIALYVITE VITAMIN D 5000) 125 MCG (5000 UT) capsule Take 15,000 Units by mouth every Monday.     lidocaine-prilocaine (EMLA) cream Apply to affected area once 30 g 3   MAGNESIUM PO Take 150 mg by mouth 3 (three) times a week.     nitroGLYCERIN (NITROSTAT) 0.4 MG SL tablet Place 1 tablet (0.4 mg total) under the tongue every 5 (five) minutes as needed for chest pain. 90 tablet 3   Omega-3 Fatty Acids (OMEGA 3 500) 500 MG CAPS Take 500 mg by mouth daily.     omeprazole (PRILOSEC) 40 MG capsule Take 40 mg by mouth every evening.     ondansetron (ZOFRAN) 8 MG tablet Take 1 tablet (8 mg total) by mouth every 8 (eight) hours as needed for nausea or vomiting. 30 tablet 1   oxyCODONE (ROXICODONE) 5 MG immediate release tablet Take 1 tablet (5 mg total) by mouth every 6 (six) hours as  needed for severe pain. 15 tablet 0   prochlorperazine (COMPAZINE) 10 MG tablet Take 1 tablet (10 mg total) by mouth every 6 (six) hours as needed for nausea or vomiting. 30 tablet 1   rosuvastatin (CRESTOR) 40 MG tablet TAKE 1 TABLET BY MOUTH EVERY DAY 90 tablet 1   valsartan-hydrochlorothiazide (DIOVAN-HCT) 80-12.5 MG tablet Take 1 tablet by mouth daily. 90 tablet 3   zolpidem (AMBIEN) 10 MG tablet Take 5-10 mg by mouth at bedtime as needed for sleep.     No current facility-administered medications for this visit.   Facility-Administered Medications Ordered in Other Visits  Medication Dose Route Frequency Provider Last Rate Last Admin   sodium chloride flush (NS) 0.9 % injection 10 mL  10 mL Intracatheter PRN Benay Pike, MD   10 mL at 10/18/21 1234   PHYSICAL EXAMINATION: ECOG PERFORMANCE STATUS: 0 - Asymptomatic  Vitals:   10/18/21 0859  BP: 129/83  Pulse: (!) 58  Resp: 18  Temp: (!) 97 F (36.1 C)  SpO2: 100%     Filed Weights   10/18/21 0859  Weight: 142 lb 3 oz (64.5 kg)     Physical Exam Constitutional:      Appearance: Normal appearance.  HENT:     Head: Normocephalic and atraumatic.  Pulmonary:     Effort: Pulmonary effort is normal.  Musculoskeletal:        General: No swelling.     Cervical back: Normal range of motion. No rigidity.  Lymphadenopathy:     Cervical: No cervical adenopathy.  Skin:    General: Skin is warm.  Neurological:     General: No focal deficit present.     Mental Status: She is alert.     LABORATORY DATA:  I have reviewed the data as listed Lab Results  Component Value Date   WBC 3.1 (L) 10/18/2021   HGB 10.6 (L) 10/18/2021   HCT 31.0 (L) 10/18/2021   MCV 89.6 10/18/2021   PLT 279 10/18/2021   Lab Results  Component Value Date   NA 138 10/18/2021   K 3.8 10/18/2021   CL 107 10/18/2021   CO2 26 10/18/2021    RADIOGRAPHIC STUDIES: I have personally reviewed the radiological reports and agreed with the findings in the report.  ASSESSMENT AND PLAN:   Malignant neoplasm of upper-outer quadrant of left breast in female, estrogen receptor positive ([place]) This is a very pleasant 61 year old postmenopausal female patient with newly diagnosed left breast invasive ductal carcinoma, grade 3, ER 100% positive strong staining PR 0% negative, Ki-67 of 35% and HER2 positive by FISH referred to breast [facility] for additional recommendations.    Given small size although HER2 amplified invasive ductal carcinoma, we have discussed to proceed with upfront surgery followed by consideration for adjuvant Taxol with Herceptin. She is now status post lumpectomy and sentinel lymph node biopsy.  Final pathology showed poorly differentiated high-grade 1.2 cm IDC, negative margins, 0 out of 3 lymph nodes involved.    She is now on adjuvant Herceptin and Taxol weekly.   She is tolerating chemotherapy very well.  No dose-limiting toxicity noted.  Physical examination unremarkable.  She will proceed with chemo as planned and will return to  clinic for an appointment in 2 weeks, will continue weekly chemotherapy as a scheduled.  Antineoplastic chemotherapy induced anemia Mild, no indication for transfusion  Total time spent: 20 minutes including history, physical exam, review of records, counseling and coordination of care  All questions were answered. The  patient knows to call the clinic with any problems, questions or concerns.    Benay Pike, MD 10/18/21

## 2021-10-18 NOTE — Patient Instructions (Signed)
[facility]   Discharge Instructions: Thank you for choosing [facility] to provide your oncology and hematology care.   If you have a lab appointment with the [facility], please go directly to the [facility] and check in at the registration area.   Wear comfortable clothing and clothing appropriate for easy access to any Portacath or PICC line.   We strive to give you quality time with your provider. You may need to reschedule your appointment if you arrive late (15 or more minutes).  Arriving late affects you and other patients whose appointments are after yours.  Also, if you miss three or more appointments without notifying the office, you may be dismissed from the clinic at the provider's discretion.      For prescription refill requests, have your pharmacy contact our office and allow 72 hours for refills to be completed.    Today you received the following chemotherapy and/or immunotherapy agents: trastuzumab-dkst and paclitaxel      To help prevent nausea and vomiting after your treatment, we encourage you to take your nausea medication as directed.  BELOW ARE SYMPTOMS THAT SHOULD BE REPORTED IMMEDIATELY: *FEVER GREATER THAN 100.4 F (38 C) OR HIGHER *CHILLS OR SWEATING *NAUSEA AND VOMITING THAT IS NOT CONTROLLED WITH YOUR NAUSEA MEDICATION *UNUSUAL SHORTNESS OF BREATH *UNUSUAL BRUISING OR BLEEDING *URINARY PROBLEMS (pain or burning when urinating, or frequent urination) *BOWEL PROBLEMS (unusual diarrhea, constipation, pain near the anus) TENDERNESS IN MOUTH AND THROAT WITH OR WITHOUT PRESENCE OF ULCERS (sore throat, sores in mouth, or a toothache) UNUSUAL RASH, SWELLING OR PAIN  UNUSUAL VAGINAL DISCHARGE OR ITCHING   Items with * indicate a potential emergency and should be followed up as soon as possible or go to the Emergency Department if any problems should occur.  Please show the CHEMOTHERAPY ALERT CARD or IMMUNOTHERAPY  ALERT CARD at check-in to the Emergency Department and triage nurse.  Should you have questions after your visit or need to cancel or reschedule your appointment, please contact [facility]  Dept: [phone]  and follow the prompts.  Office hours are 8:00 a.m. to 4:30 p.m. Monday - Friday. Please note that voicemails left after 4:00 p.m. may not be returned until the following business day.  We are closed weekends and major holidays. You have access to a nurse at all times for urgent questions. Please call the main number to the clinic Dept: [phone] and follow the prompts.   For any non-urgent questions, you may also contact your provider using MyChart. We now offer e-Visits for anyone 73 and older to request care online for non-urgent symptoms. For details visit [email or web address].   Also download the MyChart app! Go to the app store, search "MyChart", open the app, select [facility], and log in with your MyChart username and password.  Masks are optional in the cancer centers. If you would like for your care team to wear a mask while they are taking care of you, please let them know. You may have one support person who is at least 61 years old accompany you for your appointments.

## 2021-10-19 ENCOUNTER — Other Ambulatory Visit: Payer: Self-pay | Admitting: Pulmonary Disease

## 2021-10-23 MED FILL — Dexamethasone Sodium Phosphate Inj 100 MG/10ML: INTRAMUSCULAR | Qty: 1 | Status: AC

## 2021-10-24 ENCOUNTER — Other Ambulatory Visit: Payer: Self-pay

## 2021-10-24 ENCOUNTER — Inpatient Hospital Stay: Payer: No Typology Code available for payment source

## 2021-10-24 ENCOUNTER — Encounter: Payer: Self-pay | Admitting: *Deleted

## 2021-10-24 ENCOUNTER — Inpatient Hospital Stay: Payer: No Typology Code available for payment source | Admitting: Hematology and Oncology

## 2021-10-24 VITALS — BP 134/75 | HR 65 | Temp 98.9°F | Resp 16

## 2021-10-24 DIAGNOSIS — Z170 Estrogen receptor positive status [ER+]: Secondary | ICD-10-CM

## 2021-10-24 DIAGNOSIS — Z95828 Presence of other vascular implants and grafts: Secondary | ICD-10-CM

## 2021-10-24 DIAGNOSIS — Z5111 Encounter for antineoplastic chemotherapy: Secondary | ICD-10-CM | POA: Diagnosis not present

## 2021-10-24 LAB — CBC WITH DIFFERENTIAL (CANCER CENTER ONLY)
Abs Immature Granulocytes: 0.02 10*3/uL (ref 0.00–0.07)
Basophils Absolute: 0.1 10*3/uL (ref 0.0–0.1)
Basophils Relative: 2 %
Eosinophils Absolute: 0.1 10*3/uL (ref 0.0–0.5)
Eosinophils Relative: 3 %
HCT: 31.4 % — ABNORMAL LOW (ref 36.0–46.0)
Hemoglobin: 10.9 g/dL — ABNORMAL LOW (ref 12.0–15.0)
Immature Granulocytes: 1 %
Lymphocytes Relative: 35 %
Lymphs Abs: 1.4 10*3/uL (ref 0.7–4.0)
MCH: 31.1 pg (ref 26.0–34.0)
MCHC: 34.7 g/dL (ref 30.0–36.0)
MCV: 89.5 fL (ref 80.0–100.0)
Monocytes Absolute: 0.2 10*3/uL (ref 0.1–1.0)
Monocytes Relative: 6 %
Neutro Abs: 2.2 10*3/uL (ref 1.7–7.7)
Neutrophils Relative %: 53 %
Platelet Count: 294 10*3/uL (ref 150–400)
RBC: 3.51 MIL/uL — ABNORMAL LOW (ref 3.87–5.11)
RDW: 13.2 % (ref 11.5–15.5)
WBC Count: 4.1 10*3/uL (ref 4.0–10.5)
nRBC: 0 % (ref 0.0–0.2)

## 2021-10-24 LAB — CMP (CANCER CENTER ONLY)
ALT: 24 U/L (ref 0–44)
AST: 22 U/L (ref 15–41)
Albumin: 4.3 g/dL (ref 3.5–5.0)
Alkaline Phosphatase: 36 U/L — ABNORMAL LOW (ref 38–126)
Anion gap: 4 — ABNORMAL LOW (ref 5–15)
BUN: 15 mg/dL (ref 8–23)
CO2: 29 mmol/L (ref 22–32)
Calcium: 9.1 mg/dL (ref 8.9–10.3)
Chloride: 105 mmol/L (ref 98–111)
Creatinine: 0.7 mg/dL (ref 0.44–1.00)
GFR, Estimated: >60 mL/min (ref >60)
Glucose, Bld: 113 mg/dL — ABNORMAL HIGH (ref 70–99)
Potassium: 3.9 mmol/L (ref 3.5–5.1)
Sodium: 138 mmol/L (ref 135–145)
Total Bilirubin: 1.1 mg/dL (ref 0.3–1.2)
Total Protein: 6.4 g/dL — ABNORMAL LOW (ref 6.5–8.1)

## 2021-10-24 MED ORDER — SODIUM CHLORIDE 0.9 % IV SOLN
80.0000 mg/m2 | Freq: Once | INTRAVENOUS | Status: AC
  Administered 2021-10-24: 138 mg via INTRAVENOUS
  Filled 2021-10-24: qty 23

## 2021-10-24 MED ORDER — SODIUM CHLORIDE 0.9 % IV SOLN
10.0000 mg | Freq: Once | INTRAVENOUS | Status: AC
  Administered 2021-10-24: 10 mg via INTRAVENOUS
  Filled 2021-10-24: qty 10

## 2021-10-24 MED ORDER — SODIUM CHLORIDE 0.9% FLUSH
10.0000 mL | INTRAVENOUS | Status: DC | PRN
  Administered 2021-10-24: 10 mL

## 2021-10-24 MED ORDER — SODIUM CHLORIDE 0.9% FLUSH
10.0000 mL | Freq: Once | INTRAVENOUS | Status: AC
  Administered 2021-10-24: 10 mL

## 2021-10-24 MED ORDER — FAMOTIDINE IN NACL 20-0.9 MG/50ML-% IV SOLN
20.0000 mg | Freq: Once | INTRAVENOUS | Status: AC
  Administered 2021-10-24: 20 mg via INTRAVENOUS
  Filled 2021-10-24: qty 50

## 2021-10-24 MED ORDER — ACETAMINOPHEN 325 MG PO TABS
650.0000 mg | ORAL_TABLET | Freq: Once | ORAL | Status: AC
  Administered 2021-10-24: 650 mg via ORAL
  Filled 2021-10-24: qty 2

## 2021-10-24 MED ORDER — CETIRIZINE HCL 10 MG/ML IV SOLN
10.0000 mg | Freq: Once | INTRAVENOUS | Status: AC
  Administered 2021-10-24: 10 mg via INTRAVENOUS
  Filled 2021-10-24: qty 1

## 2021-10-24 MED ORDER — HEPARIN SOD (PORK) LOCK FLUSH 100 UNIT/ML IV SOLN
500.0000 [IU] | Freq: Once | INTRAVENOUS | Status: AC | PRN
  Administered 2021-10-24: 500 [IU]

## 2021-10-24 MED ORDER — SODIUM CHLORIDE 0.9 % IV SOLN: Freq: Once | INTRAVENOUS | Status: AC

## 2021-10-24 MED ORDER — TRASTUZUMAB-DKST CHEMO 150 MG IV SOLR
2.0000 mg/kg | Freq: Once | INTRAVENOUS | Status: AC
  Administered 2021-10-24: 126 mg via INTRAVENOUS
  Filled 2021-10-24: qty 6

## 2021-10-24 NOTE — Patient Instructions (Signed)
[facility]   Discharge Instructions: Thank you for choosing [facility] to provide your oncology and hematology care.   If you have a lab appointment with the [facility], please go directly to the [facility] and check in at the registration area.   Wear comfortable clothing and clothing appropriate for easy access to any Portacath or PICC line.   We strive to give you quality time with your provider. You may need to reschedule your appointment if you arrive late (15 or more minutes).  Arriving late affects you and other patients whose appointments are after yours.  Also, if you miss three or more appointments without notifying the office, you may be dismissed from the clinic at the provider's discretion.      For prescription refill requests, have your pharmacy contact our office and allow 72 hours for refills to be completed.    Today you received the following chemotherapy and/or immunotherapy agents: trastuzumab-dkst and paclitaxel      To help prevent nausea and vomiting after your treatment, we encourage you to take your nausea medication as directed.  BELOW ARE SYMPTOMS THAT SHOULD BE REPORTED IMMEDIATELY: *FEVER GREATER THAN 100.4 F (38 C) OR HIGHER *CHILLS OR SWEATING *NAUSEA AND VOMITING THAT IS NOT CONTROLLED WITH YOUR NAUSEA MEDICATION *UNUSUAL SHORTNESS OF BREATH *UNUSUAL BRUISING OR BLEEDING *URINARY PROBLEMS (pain or burning when urinating, or frequent urination) *BOWEL PROBLEMS (unusual diarrhea, constipation, pain near the anus) TENDERNESS IN MOUTH AND THROAT WITH OR WITHOUT PRESENCE OF ULCERS (sore throat, sores in mouth, or a toothache) UNUSUAL RASH, SWELLING OR PAIN  UNUSUAL VAGINAL DISCHARGE OR ITCHING   Items with * indicate a potential emergency and should be followed up as soon as possible or go to the Emergency Department if any problems should occur.  Please show the CHEMOTHERAPY ALERT CARD or IMMUNOTHERAPY  ALERT CARD at check-in to the Emergency Department and triage nurse.  Should you have questions after your visit or need to cancel or reschedule your appointment, please contact [facility]  Dept: [phone]  and follow the prompts.  Office hours are 8:00 a.m. to 4:30 p.m. Monday - Friday. Please note that voicemails left after 4:00 p.m. may not be returned until the following business day.  We are closed weekends and major holidays. You have access to a nurse at all times for urgent questions. Please call the main number to the clinic Dept: [phone] and follow the prompts.   For any non-urgent questions, you may also contact your provider using MyChart. We now offer e-Visits for anyone 57 and older to request care online for non-urgent symptoms. For details visit [email or web address].   Also download the MyChart app! Go to the app store, search "MyChart", open the app, select [facility], and log in with your MyChart username and password.  Masks are optional in the cancer centers. If you would like for your care team to wear a mask while they are taking care of you, please let them know. You may have one support person who is at least 61 years old accompany you for your appointments.

## 2021-10-30 MED FILL — Dexamethasone Sodium Phosphate Inj 100 MG/10ML: INTRAMUSCULAR | Qty: 1 | Status: AC

## 2021-10-31 ENCOUNTER — Inpatient Hospital Stay: Payer: No Typology Code available for payment source

## 2021-10-31 ENCOUNTER — Inpatient Hospital Stay (HOSPITAL_BASED_OUTPATIENT_CLINIC_OR_DEPARTMENT_OTHER): Payer: No Typology Code available for payment source | Admitting: Adult Health

## 2021-10-31 ENCOUNTER — Encounter: Payer: Self-pay | Admitting: Adult Health

## 2021-10-31 DIAGNOSIS — C50412 Malignant neoplasm of upper-outer quadrant of left female breast: Secondary | ICD-10-CM

## 2021-10-31 DIAGNOSIS — Z170 Estrogen receptor positive status [ER+]: Secondary | ICD-10-CM | POA: Diagnosis not present

## 2021-10-31 DIAGNOSIS — Z5111 Encounter for antineoplastic chemotherapy: Secondary | ICD-10-CM | POA: Diagnosis not present

## 2021-10-31 DIAGNOSIS — Z95828 Presence of other vascular implants and grafts: Secondary | ICD-10-CM

## 2021-10-31 LAB — CMP (CANCER CENTER ONLY)
ALT: 30 U/L (ref 0–44)
AST: 29 U/L (ref 15–41)
Albumin: 4.4 g/dL (ref 3.5–5.0)
Alkaline Phosphatase: 39 U/L (ref 38–126)
Anion gap: 8 (ref 5–15)
BUN: 11 mg/dL (ref 8–23)
CO2: 28 mmol/L (ref 22–32)
Calcium: 8.8 mg/dL — ABNORMAL LOW (ref 8.9–10.3)
Chloride: 102 mmol/L (ref 98–111)
Creatinine: 0.63 mg/dL (ref 0.44–1.00)
GFR, Estimated: >60 mL/min (ref >60)
Glucose, Bld: 116 mg/dL — ABNORMAL HIGH (ref 70–99)
Potassium: 3.5 mmol/L (ref 3.5–5.1)
Sodium: 138 mmol/L (ref 135–145)
Total Bilirubin: 1 mg/dL (ref 0.3–1.2)
Total Protein: 6.9 g/dL (ref 6.5–8.1)

## 2021-10-31 LAB — CBC WITH DIFFERENTIAL (CANCER CENTER ONLY)
Abs Immature Granulocytes: 0.02 10*3/uL (ref 0.00–0.07)
Basophils Absolute: 0.1 10*3/uL (ref 0.0–0.1)
Basophils Relative: 1 %
Eosinophils Absolute: 0.1 10*3/uL (ref 0.0–0.5)
Eosinophils Relative: 3 %
HCT: 31.7 % — ABNORMAL LOW (ref 36.0–46.0)
Hemoglobin: 11 g/dL — ABNORMAL LOW (ref 12.0–15.0)
Immature Granulocytes: 1 %
Lymphocytes Relative: 31 %
Lymphs Abs: 1.3 10*3/uL (ref 0.7–4.0)
MCH: 30.9 pg (ref 26.0–34.0)
MCHC: 34.7 g/dL (ref 30.0–36.0)
MCV: 89 fL (ref 80.0–100.0)
Monocytes Absolute: 0.3 10*3/uL (ref 0.1–1.0)
Monocytes Relative: 6 %
Neutro Abs: 2.5 10*3/uL (ref 1.7–7.7)
Neutrophils Relative %: 58 %
Platelet Count: 314 10*3/uL (ref 150–400)
RBC: 3.56 MIL/uL — ABNORMAL LOW (ref 3.87–5.11)
RDW: 13.2 % (ref 11.5–15.5)
WBC Count: 4.2 10*3/uL (ref 4.0–10.5)
nRBC: 0 % (ref 0.0–0.2)

## 2021-10-31 MED ORDER — SODIUM CHLORIDE 0.9 % IV SOLN
10.0000 mg | Freq: Once | INTRAVENOUS | Status: AC
  Administered 2021-10-31: 10 mg via INTRAVENOUS
  Filled 2021-10-31: qty 10

## 2021-10-31 MED ORDER — SODIUM CHLORIDE 0.9% FLUSH
10.0000 mL | INTRAVENOUS | Status: DC | PRN
  Administered 2021-10-31: 10 mL

## 2021-10-31 MED ORDER — CETIRIZINE HCL 10 MG/ML IV SOLN
10.0000 mg | Freq: Once | INTRAVENOUS | Status: AC
  Administered 2021-10-31: 10 mg via INTRAVENOUS
  Filled 2021-10-31: qty 1

## 2021-10-31 MED ORDER — SODIUM CHLORIDE 0.9% FLUSH
10.0000 mL | Freq: Once | INTRAVENOUS | Status: AC
  Administered 2021-10-31: 10 mL

## 2021-10-31 MED ORDER — FAMOTIDINE IN NACL 20-0.9 MG/50ML-% IV SOLN
20.0000 mg | Freq: Once | INTRAVENOUS | Status: AC
  Administered 2021-10-31: 20 mg via INTRAVENOUS
  Filled 2021-10-31: qty 50

## 2021-10-31 MED ORDER — HEPARIN SOD (PORK) LOCK FLUSH 100 UNIT/ML IV SOLN
500.0000 [IU] | Freq: Once | INTRAVENOUS | Status: AC | PRN
  Administered 2021-10-31: 500 [IU]

## 2021-10-31 MED ORDER — TRASTUZUMAB-DKST CHEMO 150 MG IV SOLR
2.0000 mg/kg | Freq: Once | INTRAVENOUS | Status: AC
  Administered 2021-10-31: 126 mg via INTRAVENOUS
  Filled 2021-10-31: qty 6

## 2021-10-31 MED ORDER — SODIUM CHLORIDE 0.9 % IV SOLN: Freq: Once | INTRAVENOUS | Status: AC

## 2021-10-31 MED ORDER — ACETAMINOPHEN 325 MG PO TABS
650.0000 mg | ORAL_TABLET | Freq: Once | ORAL | Status: AC
  Administered 2021-10-31: 650 mg via ORAL
  Filled 2021-10-31: qty 2

## 2021-10-31 MED ORDER — SODIUM CHLORIDE 0.9 % IV SOLN
80.0000 mg/m2 | Freq: Once | INTRAVENOUS | Status: AC
  Administered 2021-10-31: 138 mg via INTRAVENOUS
  Filled 2021-10-31: qty 23

## 2021-10-31 NOTE — Assessment & Plan Note (Signed)
Gloriajean is a 61 year old woman with stage Ia triple positive breast cancer status postlumpectomy and currently undergoing adjuvant chemotherapy with Taxol and Herceptin.  Her labs are stable thus far, c-Met is pending.  She will proceed with treatment today with Taxol and Herceptin.  She is tolerating this well and she has not developed any signs of neuropathy at this point.  Quinteria does have mild fatigue but manages this well with energy conservation which she will continue.  Since her most recent echocardiogram was completed in July she will be due for repeat next month and I placed orders for this to be completed.  She is also nearing completion of the chemotherapy portion of her IV treatment and will be due for adjuvant radiation afterwards.  I placed a referral to radiation oncology so she can get scheduled to see our rad unk team and get the ball rolling with planning her radiation therapy and appointments.  Jahniya will continue receiving treatment with Taxol and Herceptin weekly and she will see myself or Dr. Chryl Heck with every other treatment.

## 2021-10-31 NOTE — Progress Notes (Signed)
[facility] Follow up:    Christine Huddle, MD [address] [place] [address]   DIAGNOSIS:  Cancer Staging  Malignant neoplasm of upper-outer quadrant of left breast in female, estrogen receptor positive ([place]) Staging form: Breast, AJCC 8th Edition - Clinical stage from 07/29/2021: Stage IA (cT1c, cN0, cM0, G3, ER+, PR-, HER2+) - Signed by Hayden Pedro, PA-C on 07/31/2021 Stage prefix: Initial diagnosis Method of lymph node assessment: Clinical Histologic grading system: 3 grade system   SUMMARY OF ONCOLOGIC HISTORY: Oncology History  Malignant neoplasm of upper-outer quadrant of left breast in female, estrogen receptor positive ([place])  07/17/2021 Mammogram   Suspicious left breast mass at 3 o'clock, 4 cm from the nipple. No axillary adenopathy. Targeted ultrasound is performed, showing an irregular centrally hypoechoic mass with an echogenic rim measuring 7 x 5 by 7 mm, thought to correlate with the mammographically identified mass. This mass is located at 3 o'clock, 4 cm from the nipple. No axillary adenopathy.   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female, estrogen receptor positive ([place])   07/29/2021 Cancer Staging   Staging form: Breast, AJCC 8th Edition - Clinical stage from 07/29/2021: Stage IA (cT1c, cN0, cM0, G3, ER+, PR-, HER2+) - Signed by Hayden Pedro, PA-C on 07/31/2021 Stage prefix: Initial diagnosis Method of lymph node assessment: Clinical Histologic grading system: 3 grade system    Pathology Results   Path showed IDC, grade 3, ER 100% positive, strong staining intensity, PR negative, ki 67 35%, Her 2 positive   08/05/2021 Genetic Testing   Negative hereditary cancer genetic testing: no pathogenic variants detected in [facility] BRCAPlus Panel and [facility] CustomNext-Cancer +RNAinsight Panel.  Report dates are August 05, 2021 and August 08, 2021.    The BRCAplus panel  offered by [employer] and includes sequencing and deletion/duplication analysis for the following 8 genes: ATM, BRCA1, BRCA2, CDH1, CHEK2, PALB2, PTEN, and TP53. The CustomNext-Cancer+RNAinsight panel offered by [facility] includes sequencing and rearrangement analysis for the following 47 genes:  APC, ATM, AXIN2, BARD1, BMPR1A, BRCA1, BRCA2, BRIP1, CDH1, CDK4, CDKN2A, CHEK2, DICER1, EPCAM, GREM1, HOXB13, MEN1, MLH1, MSH2, MSH3, MSH6, MUTYH, NBN, NF1, NF2, NTHL1, PALB2, PMS2, POLD1, POLE, PTEN, RAD51C, RAD51D, RECQL, RET, SDHA, SDHAF2, SDHB, SDHC, SDHD, SMAD4, SMARCA4, STK11, TP53, TSC1, TSC2, and VHL.  RNA data is routinely analyzed for use in variant interpretation for all genes.   09/12/2021 - 10/03/2021 Chemotherapy   Patient is on Treatment Plan : BREAST Paclitaxel + Trastuzumab q7d / Trastuzumab q21d     09/12/2021 -  Chemotherapy   Patient is on Treatment Plan : BREAST Paclitaxel + Trastuzumab q7d / Trastuzumab q21d       CURRENT THERAPY: Week 8, Taxol/Herceptin  INTERVAL HISTORY: Christine [name] 61 y.o. female returns for follow-up and evaluation prior to receiving weekly Taxol Herceptin.  She is due for week 8 today.  Christine [name] is tolerating treatment quite well.  She denies any neuropathy.  She has experienced some mild fatigue however notes she may have overdone it this past weekend.  Her most recent echocardiogram was completed on August 07, 2021 showing a left ventricular ejection fraction of 65 to 70%.  Global longitudinal strain was normal.   Patient Active Problem List   Diagnosis Date Noted   Antineoplastic chemotherapy induced anemia 10/18/2021   Port-A-Cath in place 09/12/2021   Iron deficiency anemia 09/02/2021   Genetic testing 08/05/2021   Family  history of breast cancer 07/31/2021   Atherosclerotic heart disease 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 07/31/2021   Easy bruising 07/31/2021   Essential hypertension 07/31/2021   Hearing loss in left ear  07/31/2021   Insomnia 07/31/2021   Mixed hyperlipidemia 07/31/2021   Other long term (current) drug therapy 07/31/2021   Sleep disorder 07/31/2021   Tendency toward bleeding easily ([place]) 07/31/2021   Vitamin D deficiency 07/31/2021   Malignant neoplasm of upper-outer quadrant of left breast in female, estrogen receptor positive ([place]) 07/25/2021   Abnormal finding on mammography 07/10/2021   GERD (gastroesophageal reflux disease) 08/31/2019   Globus pharyngeus 08/02/2019   Rhinitis, chronic 08/02/2019   Elevated blood pressure reading 01/26/2019   Acute pain of right knee 10/26/2017    is allergic to lisinopril and mefloquine.  MEDICAL HISTORY: Past Medical History:  Diagnosis Date   Family history of breast cancer 07/31/2021   GERD (gastroesophageal reflux disease)    Hypertension     SURGICAL HISTORY: Past Surgical History:  Procedure Laterality Date   BREAST LUMPECTOMY WITH RADIOACTIVE SEED AND SENTINEL LYMPH NODE BIOPSY Left 08/09/2021   Procedure: LEFT BREAST RADIOACTIVE SEED LOCALIZED LUMPECTOMY AND SENTINEL NODE BIOPSY;  Surgeon: Jovita Kussmaul, MD;  Location: [facility];  Service: General;  Laterality: Left;  GEN & PEC BLOCK   CESAREAN SECTION     FOOT SURGERY Right    PORTACATH PLACEMENT Right 08/09/2021   Procedure: PORT PLACEMENT RIGHT  INTERNAL JUGULAR  WITH ULTRASOUND GUIDANCE;  Surgeon: Jovita Kussmaul, MD;  Location: [facility];  Service: General;  Laterality: Right;  RIGHT INTERNAL JUGULAR PLACEMENT    SOCIAL HISTORY: Social History   Socioeconomic History   Marital status: Married    Spouse name: Not on file   Number of children: Not on file   Years of education: Not on file   Highest education level: Not on file  Occupational History   Not on file  Tobacco Use   Smoking status: Never   Smokeless tobacco: Never  Vaping Use   Vaping Use: Never used  Substance and Sexual Activity   Alcohol use: Yes    Alcohol/week: 2.0 standard drinks of alcohol    Types: 2  Glasses of wine per week   Drug use: Never   Sexual activity: Not on file  Other Topics Concern   Not on file  Social History Narrative   Not on file   Social Determinants of Health   Financial Resource Strain: Not on file  Food Insecurity: Not on file  Transportation Needs: Not on file  Physical Activity: Not on file  Stress: Not on file  Social Connections: Not on file  Intimate Partner Violence: Not on file    FAMILY HISTORY: Family History  Problem Relation Age of Onset   Heart disease Father    Breast cancer Sister 81   Esophageal cancer Maternal Uncle        dx after age 51   Lung cancer Paternal Uncle    Skin cancer Paternal Uncle     Review of Systems  Constitutional:  Positive for fatigue. Negative for appetite change, chills, fever and unexpected weight change.  HENT:   Negative for hearing loss, lump/mass and trouble swallowing.   Eyes:  Negative for eye problems and icterus.  Respiratory:  Negative for chest tightness, cough and shortness of breath.   Cardiovascular:  Negative for chest pain, leg swelling and palpitations.  Gastrointestinal:  Negative for abdominal distention, abdominal pain,  constipation, diarrhea, nausea and vomiting.  Endocrine: Negative for hot flashes.  Genitourinary:  Negative for difficulty urinating.   Musculoskeletal:  Negative for arthralgias.  Skin:  Negative for itching and rash.  Neurological:  Negative for dizziness, extremity weakness, headaches and numbness.  Hematological:  Negative for adenopathy. Does not bruise/bleed easily.  Psychiatric/Behavioral:  Negative for depression. The patient is not nervous/anxious.       PHYSICAL EXAMINATION  ECOG PERFORMANCE STATUS: 1 - Symptomatic but completely ambulatory  Vitals:   10/31/21 1207  BP: 126/75  Pulse: 70  Resp: 14  Temp: (!) 97.3 F (36.3 C)  SpO2: 100%    Physical Exam Constitutional:      General: She is not in acute distress.    Appearance: Normal  appearance. She is not toxic-appearing.  HENT:     Head: Normocephalic and atraumatic.  Eyes:     General: No scleral icterus. Cardiovascular:     Rate and Rhythm: Normal rate and regular rhythm.     Pulses: Normal pulses.     Heart sounds: Normal heart sounds.  Pulmonary:     Effort: Pulmonary effort is normal.     Breath sounds: Normal breath sounds.  Abdominal:     General: Abdomen is flat. Bowel sounds are normal. There is no distension.     Palpations: Abdomen is soft.     Tenderness: There is no abdominal tenderness.  Musculoskeletal:        General: No swelling.     Cervical back: Neck supple.  Lymphadenopathy:     Cervical: No cervical adenopathy.  Skin:    General: Skin is warm and dry.     Findings: No rash.  Neurological:     General: No focal deficit present.     Mental Status: She is alert.  Psychiatric:        Mood and Affect: Mood normal.        Behavior: Behavior normal.     LABORATORY DATA:  CBC    Component Value Date/Time   WBC 4.2 10/31/2021 1150   WBC 6.8 06/16/2019 1456   RBC 3.56 (L) 10/31/2021 1150   HGB 11.0 (L) 10/31/2021 1150   HCT 31.7 (L) 10/31/2021 1150   PLT 314 10/31/2021 1150   MCV 89.0 10/31/2021 1150   MCH 30.9 10/31/2021 1150   MCHC 34.7 10/31/2021 1150   RDW 13.2 10/31/2021 1150   LYMPHSABS 1.3 10/31/2021 1150   MONOABS 0.3 10/31/2021 1150   EOSABS 0.1 10/31/2021 1150   BASOSABS 0.1 10/31/2021 1150    CMP     Component Value Date/Time   NA 138 10/24/2021 1309   NA 142 10/31/2019 1545   K 3.9 10/24/2021 1309   CL 105 10/24/2021 1309   CO2 29 10/24/2021 1309   GLUCOSE 113 (H) 10/24/2021 1309   BUN 15 10/24/2021 1309   BUN 11 10/31/2019 1545   CREATININE 0.70 10/24/2021 1309   CALCIUM 9.1 10/24/2021 1309   PROT 6.4 (L) 10/24/2021 1309   PROT 6.7 08/01/2020 1004   ALBUMIN 4.3 10/24/2021 1309   ALBUMIN 4.5 08/01/2020 1004   AST 22 10/24/2021 1309   ALT 24 10/24/2021 1309   ALKPHOS 36 (L) 10/24/2021 1309    BILITOT 1.1 10/24/2021 1309   GFRNONAA >60 10/24/2021 1309   GFRAA 104 10/31/2019 1545       PENDING LABS:   RADIOGRAPHIC STUDIES:  No results found.   PATHOLOGY:     ASSESSMENT and THERAPY PLAN:   Malignant  neoplasm of upper-outer quadrant of left breast in female, estrogen receptor positive ([place]) Christine [name] is a 61 year old woman with stage Ia triple positive breast cancer status postlumpectomy and currently undergoing adjuvant chemotherapy with Taxol and Herceptin.  Her labs are stable thus far, c-Met is pending.  She will proceed with treatment today with Taxol and Herceptin.  She is tolerating this well and she has not developed any signs of neuropathy at this point.  Christine [name] does have mild fatigue but manages this well with energy conservation which she will continue.  Since her most recent echocardiogram was completed in July she will be due for repeat next month and I placed orders for this to be completed.  She is also nearing completion of the chemotherapy portion of her IV treatment and will be due for adjuvant radiation afterwards.  I placed a referral to radiation oncology so she can get scheduled to see our rad unk team and get the ball rolling with planning her radiation therapy and appointments.  Christine [name] will continue receiving treatment with Taxol and Herceptin weekly and she will see myself or Dr. Chryl Heck with every other treatment.   Orders Placed This Encounter  Procedures   Ambulatory referral to Radiation Oncology    Referral Priority:   Routine    Referral Type:   Consultation    Referral Reason:   Specialty Services Required    Requested Specialty:   Radiation Oncology    Number of Visits Requested:   1   ECHOCARDIOGRAM COMPLETE    Standing Status:   Future    Standing Expiration Date:   11/01/2022    Order Specific Question:   Where should this test be performed    Answer:   [facility]    Order Specific Question:   Perflutren DEFINITY (image  enhancing agent) should be administered unless hypersensitivity or allergy exist    Answer:   Administer Perflutren    Order Specific Question:   Reason for exam-Echo    Answer:   Chemo  Z09    All questions were answered. The patient knows to call the clinic with any problems, questions or concerns. We can certainly see the patient much sooner if necessary. This note was electronically signed. Scot Dock, NP 10/31/2021

## 2021-11-04 ENCOUNTER — Other Ambulatory Visit: Payer: Self-pay

## 2021-11-06 MED FILL — Dexamethasone Sodium Phosphate Inj 100 MG/10ML: INTRAMUSCULAR | Qty: 1 | Status: AC

## 2021-11-07 ENCOUNTER — Encounter: Payer: Self-pay | Admitting: Hematology and Oncology

## 2021-11-07 ENCOUNTER — Other Ambulatory Visit: Payer: Self-pay | Admitting: Hematology and Oncology

## 2021-11-07 ENCOUNTER — Inpatient Hospital Stay: Payer: No Typology Code available for payment source

## 2021-11-07 ENCOUNTER — Telehealth: Payer: Self-pay | Admitting: *Deleted

## 2021-11-07 VITALS — BP 123/60 | HR 66 | Temp 97.5°F | Resp 18 | Wt 143.5 lb

## 2021-11-07 DIAGNOSIS — Z7982 Long term (current) use of aspirin: Secondary | ICD-10-CM | POA: Insufficient documentation

## 2021-11-07 DIAGNOSIS — Z170 Estrogen receptor positive status [ER+]: Secondary | ICD-10-CM

## 2021-11-07 DIAGNOSIS — Z452 Encounter for adjustment and management of vascular access device: Secondary | ICD-10-CM | POA: Insufficient documentation

## 2021-11-07 DIAGNOSIS — Z800 Family history of malignant neoplasm of digestive organs: Secondary | ICD-10-CM | POA: Insufficient documentation

## 2021-11-07 DIAGNOSIS — Z510 Encounter for antineoplastic radiation therapy: Secondary | ICD-10-CM | POA: Insufficient documentation

## 2021-11-07 DIAGNOSIS — I10 Essential (primary) hypertension: Secondary | ICD-10-CM | POA: Insufficient documentation

## 2021-11-07 DIAGNOSIS — R21 Rash and other nonspecific skin eruption: Secondary | ICD-10-CM | POA: Insufficient documentation

## 2021-11-07 DIAGNOSIS — G629 Polyneuropathy, unspecified: Secondary | ICD-10-CM | POA: Insufficient documentation

## 2021-11-07 DIAGNOSIS — Z5189 Encounter for other specified aftercare: Secondary | ICD-10-CM | POA: Insufficient documentation

## 2021-11-07 DIAGNOSIS — K219 Gastro-esophageal reflux disease without esophagitis: Secondary | ICD-10-CM | POA: Insufficient documentation

## 2021-11-07 DIAGNOSIS — Z9221 Personal history of antineoplastic chemotherapy: Secondary | ICD-10-CM | POA: Insufficient documentation

## 2021-11-07 DIAGNOSIS — Z5111 Encounter for antineoplastic chemotherapy: Secondary | ICD-10-CM | POA: Insufficient documentation

## 2021-11-07 DIAGNOSIS — Z95828 Presence of other vascular implants and grafts: Secondary | ICD-10-CM

## 2021-11-07 DIAGNOSIS — R509 Fever, unspecified: Secondary | ICD-10-CM | POA: Insufficient documentation

## 2021-11-07 DIAGNOSIS — Z803 Family history of malignant neoplasm of breast: Secondary | ICD-10-CM | POA: Insufficient documentation

## 2021-11-07 DIAGNOSIS — Z79899 Other long term (current) drug therapy: Secondary | ICD-10-CM | POA: Insufficient documentation

## 2021-11-07 DIAGNOSIS — C50412 Malignant neoplasm of upper-outer quadrant of left female breast: Secondary | ICD-10-CM | POA: Insufficient documentation

## 2021-11-07 DIAGNOSIS — Z5112 Encounter for antineoplastic immunotherapy: Secondary | ICD-10-CM | POA: Insufficient documentation

## 2021-11-07 DIAGNOSIS — Z801 Family history of malignant neoplasm of trachea, bronchus and lung: Secondary | ICD-10-CM | POA: Insufficient documentation

## 2021-11-07 LAB — CBC WITH DIFFERENTIAL (CANCER CENTER ONLY)
Abs Immature Granulocytes: 0.03 10*3/uL (ref 0.00–0.07)
Basophils Absolute: 0 10*3/uL (ref 0.0–0.1)
Basophils Relative: 1 %
Eosinophils Absolute: 0.2 10*3/uL (ref 0.0–0.5)
Eosinophils Relative: 4 %
HCT: 29.2 % — ABNORMAL LOW (ref 36.0–46.0)
Hemoglobin: 10.1 g/dL — ABNORMAL LOW (ref 12.0–15.0)
Immature Granulocytes: 1 %
Lymphocytes Relative: 30 %
Lymphs Abs: 1.3 10*3/uL (ref 0.7–4.0)
MCH: 31.7 pg (ref 26.0–34.0)
MCHC: 34.6 g/dL (ref 30.0–36.0)
MCV: 91.5 fL (ref 80.0–100.0)
Monocytes Absolute: 0.3 10*3/uL (ref 0.1–1.0)
Monocytes Relative: 8 %
Neutro Abs: 2.3 10*3/uL (ref 1.7–7.7)
Neutrophils Relative %: 56 %
Platelet Count: 308 10*3/uL (ref 150–400)
RBC: 3.19 MIL/uL — ABNORMAL LOW (ref 3.87–5.11)
RDW: 13.6 % (ref 11.5–15.5)
WBC Count: 4.1 10*3/uL (ref 4.0–10.5)
nRBC: 0 % (ref 0.0–0.2)

## 2021-11-07 LAB — CMP (CANCER CENTER ONLY)
ALT: 30 U/L (ref 0–44)
AST: 26 U/L (ref 15–41)
Albumin: 4 g/dL (ref 3.5–5.0)
Alkaline Phosphatase: 33 U/L — ABNORMAL LOW (ref 38–126)
Anion gap: 2 — ABNORMAL LOW (ref 5–15)
BUN: 15 mg/dL (ref 8–23)
CO2: 29 mmol/L (ref 22–32)
Calcium: 8.6 mg/dL — ABNORMAL LOW (ref 8.9–10.3)
Chloride: 105 mmol/L (ref 98–111)
Creatinine: 0.69 mg/dL (ref 0.44–1.00)
GFR, Estimated: >60 mL/min (ref >60)
Glucose, Bld: 111 mg/dL — ABNORMAL HIGH (ref 70–99)
Potassium: 4.1 mmol/L (ref 3.5–5.1)
Sodium: 136 mmol/L (ref 135–145)
Total Bilirubin: 0.8 mg/dL (ref 0.3–1.2)
Total Protein: 6.4 g/dL — ABNORMAL LOW (ref 6.5–8.1)

## 2021-11-07 MED ORDER — HEPARIN SOD (PORK) LOCK FLUSH 100 UNIT/ML IV SOLN
500.0000 [IU] | Freq: Once | INTRAVENOUS | Status: AC | PRN
  Administered 2021-11-07: 500 [IU]

## 2021-11-07 MED ORDER — ACETAMINOPHEN 325 MG PO TABS
650.0000 mg | ORAL_TABLET | Freq: Once | ORAL | Status: AC
  Administered 2021-11-07: 650 mg via ORAL
  Filled 2021-11-07: qty 2

## 2021-11-07 MED ORDER — BETAMETHASONE VALERATE 0.1 % EX OINT: 1.0000 | TOPICAL_OINTMENT | Freq: Two times a day (BID) | CUTANEOUS | 0 refills | Status: DC

## 2021-11-07 MED ORDER — CETIRIZINE HCL 10 MG/ML IV SOLN
10.0000 mg | Freq: Once | INTRAVENOUS | Status: AC
  Administered 2021-11-07: 10 mg via INTRAVENOUS
  Filled 2021-11-07: qty 1

## 2021-11-07 MED ORDER — SODIUM CHLORIDE 0.9 % IV SOLN
80.0000 mg/m2 | Freq: Once | INTRAVENOUS | Status: AC
  Administered 2021-11-07: 138 mg via INTRAVENOUS
  Filled 2021-11-07: qty 23

## 2021-11-07 MED ORDER — TRASTUZUMAB-DKST CHEMO 150 MG IV SOLR
2.0000 mg/kg | Freq: Once | INTRAVENOUS | Status: AC
  Administered 2021-11-07: 126 mg via INTRAVENOUS
  Filled 2021-11-07: qty 6

## 2021-11-07 MED ORDER — SODIUM CHLORIDE 0.9% FLUSH
10.0000 mL | INTRAVENOUS | Status: DC | PRN
  Administered 2021-11-07: 10 mL

## 2021-11-07 MED ORDER — SODIUM CHLORIDE 0.9 % IV SOLN
10.0000 mg | Freq: Once | INTRAVENOUS | Status: AC
  Administered 2021-11-07: 10 mg via INTRAVENOUS
  Filled 2021-11-07: qty 10

## 2021-11-07 MED ORDER — SODIUM CHLORIDE 0.9% FLUSH
10.0000 mL | Freq: Once | INTRAVENOUS | Status: AC
  Administered 2021-11-07: 10 mL

## 2021-11-07 MED ORDER — SODIUM CHLORIDE 0.9 % IV SOLN: Freq: Once | INTRAVENOUS | Status: AC

## 2021-11-07 MED ORDER — FAMOTIDINE IN NACL 20-0.9 MG/50ML-% IV SOLN
20.0000 mg | Freq: Once | INTRAVENOUS | Status: AC
  Administered 2021-11-07: 20 mg via INTRAVENOUS
  Filled 2021-11-07: qty 50

## 2021-11-07 NOTE — Progress Notes (Unsigned)
The rash appears to be consistent with photosensitivity to taxol. Sunscreen and full sleeve reommended Cold compresses can be tried. Will prescribe betamethasone for topical steroid She understands the rash can get worse with repeating taxane and she is willing to continue and see what happens. Will call her Monday to see how she is doing.  Christine [name]

## 2021-11-07 NOTE — Patient Instructions (Signed)
[facility]  Discharge Instructions: Thank you for choosing [facility] to provide your oncology and hematology care.   If you have a lab appointment with the [facility], please go directly to the [facility] and check in at the registration area.   Wear comfortable clothing and clothing appropriate for easy access to any Portacath or PICC line.   We strive to give you quality time with your provider. You may need to reschedule your appointment if you arrive late (15 or more minutes).  Arriving late affects you and other patients whose appointments are after yours.  Also, if you miss three or more appointments without notifying the office, you may be dismissed from the clinic at the provider's discretion.      For prescription refill requests, have your pharmacy contact our office and allow 72 hours for refills to be completed.    Today you received the following chemotherapy and/or immunotherapy agents: Paclitaxel and Trastuzumab      To help prevent nausea and vomiting after your treatment, we encourage you to take your nausea medication as directed.  BELOW ARE SYMPTOMS THAT SHOULD BE REPORTED IMMEDIATELY: *FEVER GREATER THAN 100.4 F (38 C) OR HIGHER *CHILLS OR SWEATING *NAUSEA AND VOMITING THAT IS NOT CONTROLLED WITH YOUR NAUSEA MEDICATION *UNUSUAL SHORTNESS OF BREATH *UNUSUAL BRUISING OR BLEEDING *URINARY PROBLEMS (pain or burning when urinating, or frequent urination) *BOWEL PROBLEMS (unusual diarrhea, constipation, pain near the anus) TENDERNESS IN MOUTH AND THROAT WITH OR WITHOUT PRESENCE OF ULCERS (sore throat, sores in mouth, or a toothache) UNUSUAL RASH, SWELLING OR PAIN  UNUSUAL VAGINAL DISCHARGE OR ITCHING   Items with * indicate a potential emergency and should be followed up as soon as possible or go to the Emergency Department if any problems should occur.  Please show the CHEMOTHERAPY ALERT CARD or IMMUNOTHERAPY ALERT  CARD at check-in to the Emergency Department and triage nurse.  Should you have questions after your visit or need to cancel or reschedule your appointment, please contact [facility]  Dept: [phone]  and follow the prompts.  Office hours are 8:00 a.m. to 4:30 p.m. Monday - Friday. Please note that voicemails left after 4:00 p.m. may not be returned until the following business day.  We are closed weekends and major holidays. You have access to a nurse at all times for urgent questions. Please call the main number to the clinic Dept: [phone] and follow the prompts.   For any non-urgent questions, you may also contact your provider using MyChart. We now offer e-Visits for anyone 43 and older to request care online for non-urgent symptoms. For details visit [email or web address].   Also download the MyChart app! Go to the app store, search "MyChart", open the app, select [facility], and log in with your MyChart username and password.  Masks are optional in the cancer centers. If you would like for your care team to wear a mask while they are taking care of you, please let them know. You may have one support person who is at least 61 years old accompany you for your appointments.

## 2021-11-08 ENCOUNTER — Ambulatory Visit (HOSPITAL_COMMUNITY)
Admission: RE | Admit: 2021-11-08 | Discharge: 2021-11-08 | Disposition: A | Discharge status: Home / Self Care | Payer: No Typology Code available for payment source | Source: Ambulatory Visit | Attending: Adult Health | Admitting: Adult Health

## 2021-11-08 DIAGNOSIS — I10 Essential (primary) hypertension: Secondary | ICD-10-CM | POA: Diagnosis not present

## 2021-11-08 DIAGNOSIS — Z01818 Encounter for other preprocedural examination: Secondary | ICD-10-CM | POA: Diagnosis present

## 2021-11-08 DIAGNOSIS — Z170 Estrogen receptor positive status [ER+]: Secondary | ICD-10-CM | POA: Diagnosis not present

## 2021-11-08 DIAGNOSIS — C50412 Malignant neoplasm of upper-outer quadrant of left female breast: Secondary | ICD-10-CM | POA: Diagnosis not present

## 2021-11-08 DIAGNOSIS — Z0189 Encounter for other specified special examinations: Secondary | ICD-10-CM | POA: Diagnosis not present

## 2021-11-08 LAB — ECHOCARDIOGRAM COMPLETE
AR max vel: 2.45 cm2
AV Peak grad: 11.2 mmHg
Ao pk vel: 1.67 m/s
Area-P 1/2: 3.81 cm2
S' Lateral: 2.3 cm

## 2021-11-11 ENCOUNTER — Encounter: Payer: Self-pay | Admitting: Hematology and Oncology

## 2021-11-11 ENCOUNTER — Ambulatory Visit: Payer: No Typology Code available for payment source | Attending: General Surgery

## 2021-11-11 ENCOUNTER — Inpatient Hospital Stay (HOSPITAL_BASED_OUTPATIENT_CLINIC_OR_DEPARTMENT_OTHER): Payer: No Typology Code available for payment source | Admitting: Hematology and Oncology

## 2021-11-11 VITALS — Wt 141.5 lb

## 2021-11-11 DIAGNOSIS — C50412 Malignant neoplasm of upper-outer quadrant of left female breast: Secondary | ICD-10-CM

## 2021-11-11 DIAGNOSIS — Z170 Estrogen receptor positive status [ER+]: Secondary | ICD-10-CM | POA: Diagnosis not present

## 2021-11-11 DIAGNOSIS — Z483 Aftercare following surgery for neoplasm: Secondary | ICD-10-CM | POA: Insufficient documentation

## 2021-11-11 NOTE — Progress Notes (Signed)
This is a 61 year old postmenopausal female patient with newly diagnosed left breast invasive ductal carcinoma [facility] [place] NOTE  Patient Care Team: [name], MD as PCP - General (Internal Medicine) Buford Dresser, MD as PCP - Cardiology (Cardiology) Rockwell Germany, RN as Oncology Nurse Navigator Mauro Kaufmann, RN as Oncology Nurse Navigator Jovita Kussmaul, MD as Consulting Physician (General Surgery) Benay Pike, MD as Consulting Physician (Hematology and Oncology) Kyung Rudd, MD as Consulting Physician (Radiation Oncology)  CHIEF COMPLAINTS/PURPOSE OF CONSULTATION:  Newly diagnosed breast cancer  HISTORY OF PRESENTING ILLNESS:  Christine [name] 61 y.o. female is here because of recent diagnosis of left breast cancer  I reviewed her records extensively and collaborated the history with the patient.  SUMMARY OF ONCOLOGIC HISTORY: Oncology History  Malignant neoplasm of upper-outer quadrant of left breast in female, estrogen receptor positive ([place])  07/17/2021 Mammogram   Suspicious left breast mass at 3 o'clock, 4 cm from the nipple. No axillary adenopathy. Targeted ultrasound is performed, showing an irregular centrally hypoechoic mass with an echogenic rim measuring 7 x 5 by 7 mm, thought to correlate with the mammographically identified mass. This mass is located at 3 o'clock, 4 cm from the nipple. No axillary adenopathy.   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female, estrogen receptor positive ([place])   07/29/2021 Cancer Staging   Staging form: Breast, AJCC 8th Edition - Clinical stage from 07/29/2021: Stage IA (cT1c, cN0, cM0, G3, ER+, PR-, HER2+) - Signed by Hayden Pedro, PA-C on 07/31/2021 Stage prefix: Initial diagnosis Method of lymph node assessment: Clinical Histologic grading system: 3 grade system    Pathology Results   Path showed IDC, grade 3, ER 100%  positive, strong staining intensity, PR negative, ki 67 35%, Her 2 positive   08/05/2021 Genetic Testing   Negative hereditary cancer genetic testing: no pathogenic variants detected in [facility] BRCAPlus Panel and [employer] CustomNext-Cancer +RNAinsight Panel.  Report dates are August 05, 2021 and August 08, 2021.    The BRCAplus panel offered by [employer] and includes sequencing and deletion/duplication analysis for the following 8 genes: ATM, BRCA1, BRCA2, CDH1, CHEK2, PALB2, PTEN, and TP53. The CustomNext-Cancer+RNAinsight panel offered by [employer] includes sequencing and rearrangement analysis for the following 47 genes:  APC, ATM, AXIN2, BARD1, BMPR1A, BRCA1, BRCA2, BRIP1, CDH1, CDK4, CDKN2A, CHEK2, DICER1, EPCAM, GREM1, HOXB13, MEN1, MLH1, MSH2, MSH3, MSH6, MUTYH, NBN, NF1, NF2, NTHL1, PALB2, PMS2, POLD1, POLE, PTEN, RAD51C, RAD51D, RECQL, RET, SDHA, SDHAF2, SDHB, SDHC, SDHD, SMAD4, SMARCA4, STK11, TP53, TSC1, TSC2, and VHL.  RNA data is routinely analyzed for use in variant interpretation for all genes.   09/12/2021 - 10/03/2021 Chemotherapy   Patient is on Treatment Plan : BREAST Paclitaxel + Trastuzumab q7d / Trastuzumab q21d     09/12/2021 -  Chemotherapy   Patient is on Treatment Plan : BREAST Paclitaxel + Trastuzumab q7d / Trastuzumab q21d      She is here for telephone follow-up since her skin rash noted in the infusion.  She has applied betamethasone twice a week and has noticed some improvement.  She is also taking precautions to wear full sleeves as well as a sunscreen daily.  Rest of the pertinent 10 point ROS reviewed and negative  MEDICAL HISTORY:  Past Medical History:  Diagnosis Date   Family history of breast cancer 07/31/2021   GERD (gastroesophageal reflux disease)    Hypertension     SURGICAL HISTORY:  Past Surgical History:  Procedure Laterality Date   BREAST LUMPECTOMY WITH RADIOACTIVE SEED AND SENTINEL LYMPH NODE BIOPSY Left 08/09/2021   Procedure: LEFT BREAST  RADIOACTIVE SEED LOCALIZED LUMPECTOMY AND SENTINEL NODE BIOPSY;  Surgeon: Jovita Kussmaul, MD;  Location: [facility];  Service: General;  Laterality: Left;  GEN & PEC BLOCK   CESAREAN SECTION     FOOT SURGERY Right    PORTACATH PLACEMENT Right 08/09/2021   Procedure: PORT PLACEMENT RIGHT  INTERNAL JUGULAR  WITH ULTRASOUND GUIDANCE;  Surgeon: Jovita Kussmaul, MD;  Location: [facility];  Service: General;  Laterality: Right;  RIGHT INTERNAL JUGULAR PLACEMENT    SOCIAL HISTORY: Social History   Socioeconomic History   Marital status: Married    Spouse name: Not on file   Number of children: Not on file   Years of education: Not on file   Highest education level: Not on file  Occupational History   Not on file  Tobacco Use   Smoking status: Never   Smokeless tobacco: Never  Vaping Use   Vaping Use: Never used  Substance and Sexual Activity   Alcohol use: Yes    Alcohol/week: 2.0 standard drinks of alcohol    Types: 2 Glasses of wine per week   Drug use: Never   Sexual activity: Not on file  Other Topics Concern   Not on file  Social History Narrative   Not on file   Social Determinants of Health   Financial Resource Strain: Not on file  Food Insecurity: Not on file  Transportation Needs: Not on file  Physical Activity: Not on file  Stress: Not on file  Social Connections: Not on file  Intimate Partner Violence: Not on file    FAMILY HISTORY: Family History  Problem Relation Age of Onset   Heart disease Father    Breast cancer Sister 2   Esophageal cancer Maternal Uncle        dx after age 105   Lung cancer Paternal Uncle    Skin cancer Paternal Uncle     ALLERGIES:  is allergic to lisinopril and mefloquine.  MEDICATIONS:  Current Outpatient Medications  Medication Sig Dispense Refill   acetaminophen (TYLENOL) 500 MG tablet Take 1,000 mg by mouth every 6 (six) hours as needed for moderate pain.     aspirin EC 81 MG tablet Take 81 mg by mouth daily. Swallow whole.      betamethasone valerate ointment (VALISONE) 0.1 % Apply 1 Application topically 2 (two) times daily. 30 g 0   Cholecalciferol (DIALYVITE VITAMIN D 5000) 125 MCG (5000 UT) capsule Take 15,000 Units by mouth every Monday.     lidocaine-prilocaine (EMLA) cream Apply to affected area once 30 g 3   MAGNESIUM PO Take 150 mg by mouth 3 (three) times a week.     nitroGLYCERIN (NITROSTAT) 0.4 MG SL tablet Place 1 tablet (0.4 mg total) under the tongue every 5 (five) minutes as needed for chest pain. 90 tablet 3   Omega-3 Fatty Acids (OMEGA 3 500) 500 MG CAPS Take 500 mg by mouth daily.     omeprazole (PRILOSEC) 40 MG capsule Take 40 mg by mouth every evening.     ondansetron (ZOFRAN) 8 MG tablet Take 1 tablet (8 mg total) by mouth every 8 (eight) hours as needed for nausea or vomiting. 30 tablet 1   oxyCODONE (ROXICODONE) 5 MG immediate release tablet Take 1 tablet (5 mg total) by mouth every 6 (six) hours as needed for severe  pain. 15 tablet 0   prochlorperazine (COMPAZINE) 10 MG tablet Take 1 tablet (10 mg total) by mouth every 6 (six) hours as needed for nausea or vomiting. 30 tablet 1   rosuvastatin (CRESTOR) 40 MG tablet TAKE 1 TABLET BY MOUTH EVERY DAY 90 tablet 1   valsartan-hydrochlorothiazide (DIOVAN-HCT) 80-12.5 MG tablet Take 1 tablet by mouth daily. 90 tablet 3   zolpidem (AMBIEN) 10 MG tablet Take 5-10 mg by mouth at bedtime as needed for sleep.     No current facility-administered medications for this visit.   PHYSICAL EXAMINATION: ECOG PERFORMANCE STATUS: 0 - Asymptomatic  There were no vitals taken for this visit.  Telephone visit, no exam done today  LABORATORY DATA:  I have reviewed the data as listed Lab Results  Component Value Date   WBC 4.1 11/07/2021   HGB 10.1 (L) 11/07/2021   HCT 29.2 (L) 11/07/2021   MCV 91.5 11/07/2021   PLT 308 11/07/2021   Lab Results  Component Value Date   NA 136 11/07/2021   K 4.1 11/07/2021   CL 105 11/07/2021   CO2 29 11/07/2021     RADIOGRAPHIC STUDIES: I have personally reviewed the radiological reports and agreed with the findings in the report.  ASSESSMENT AND PLAN:   Malignant neoplasm of upper-outer quadrant of left breast in female, estrogen receptor positive ([place]) Lahari is a 61 year old woman with stage Ia triple positive breast cancer status postlumpectomy and currently undergoing adjuvant chemotherapy with Taxol and Herceptin. I saw her last week in infusion because she had a skin rash on her forearms and the back of her neck.  We recommended long sleeves, sunscreen as well as some topical betamethasone since this appears to be consistent with photosensitivity from taxane.  And today she tells me that the rash has improved a bit however she has noted some neuropathy which kind of lingered for about a couple days.  She still feels a bit numb in her fingers today.  Given this change in neuropathy, have recommended dose reduction of Taxol from 80 to 65 mg per metered square, 20% dose reduction. She will return to clinic next week.  I connected with  TENLEY WINWARD on 11/11/21 by a telephone application and verified that I am speaking with the correct person using two identifiers.   I discussed the limitations of evaluation and management by telemedicine. The patient expressed understanding and agreed to proceed.  Total time spent: 12 minutes including history, review of records, counseling and coordination of care. All questions were answered. The patient knows to call the clinic with any problems, questions or concerns.    Benay Pike, MD 11/11/21

## 2021-11-11 NOTE — Assessment & Plan Note (Signed)
Christine [name] is a 62 year old woman with stage Ia triple positive breast cancer status postlumpectomy and currently undergoing adjuvant chemotherapy with Taxol and Herceptin. I saw her last week in infusion because she had a skin rash on her forearms and the back of her neck.  We recommended long sleeves, sunscreen as well as some topical betamethasone since this appears to be consistent with photosensitivity from taxane.  And today she tells me that the rash has improved a bit however she has noted some neuropathy which kind of lingered for about a couple days.  She still feels a bit numb in her fingers today.  Given this change in neuropathy, have recommended dose reduction of Taxol from 80 to 65 mg per metered square, 20% dose reduction. She will return to clinic next week.

## 2021-11-11 NOTE — Therapy (Signed)
OUTPATIENT PHYSICAL THERAPY SOZO SCREENING NOTE   Patient Name: Christine [name] MRN: [id number] DOB:1960-03-15, 61 y.o., female Today's Date: 11/11/2021  PCP: [name], MD REFERRING PROVIDER: Jovita Kussmaul, MD   PT End of Session - 11/11/21 0858     Visit Number 3   # unchanged due to screen only   PT Start Time 0856    PT Stop Time 0900    PT Time Calculation (min) 4 min    Activity Tolerance Patient tolerated treatment well    Behavior During Therapy [facility] for tasks assessed/performed             Past Medical History:  Diagnosis Date   Family history of breast cancer 07/31/2021   GERD (gastroesophageal reflux disease)    Hypertension    Past Surgical History:  Procedure Laterality Date   BREAST LUMPECTOMY WITH RADIOACTIVE SEED AND SENTINEL LYMPH NODE BIOPSY Left 08/09/2021   Procedure: LEFT BREAST RADIOACTIVE SEED LOCALIZED LUMPECTOMY AND SENTINEL NODE BIOPSY;  Surgeon: Jovita Kussmaul, MD;  Location: [place];  Service: General;  Laterality: Left;  GEN & PEC BLOCK   CESAREAN SECTION     FOOT SURGERY Right    PORTACATH PLACEMENT Right 08/09/2021   Procedure: PORT PLACEMENT RIGHT  INTERNAL JUGULAR  WITH ULTRASOUND GUIDANCE;  Surgeon: Jovita Kussmaul, MD;  Location: [place];  Service: General;  Laterality: Right;  RIGHT INTERNAL JUGULAR PLACEMENT   Patient Active Problem List   Diagnosis Date Noted   Antineoplastic chemotherapy induced anemia 10/18/2021   Port-A-Cath in place 09/12/2021   Iron deficiency anemia 09/02/2021   Genetic testing 08/05/2021   Family history of breast cancer 07/31/2021   Atherosclerotic heart disease 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 07/31/2021   Easy bruising 07/31/2021   Essential hypertension 07/31/2021   Hearing loss in left ear 07/31/2021   Insomnia 07/31/2021   Mixed hyperlipidemia 07/31/2021   Other long term (current) drug therapy 07/31/2021   Sleep disorder 07/31/2021   Tendency toward bleeding easily ([place])  07/31/2021   Vitamin D deficiency 07/31/2021   Malignant neoplasm of upper-outer quadrant of left breast in female, estrogen receptor positive ([place]) 07/25/2021   Abnormal finding on mammography 07/10/2021   GERD (gastroesophageal reflux disease) 08/31/2019   Globus pharyngeus 08/02/2019   Rhinitis, chronic 08/02/2019   Elevated blood pressure reading 01/26/2019   Acute pain of right knee 10/26/2017    REFERRING DIAG: left breast cancer at risk for lymphedema  THERAPY DIAG: Aftercare following surgery for neoplasm  PERTINENT HISTORY: Patient was diagnosed on 07/08/2021 with left grade 3 invasive ductal carcinoma breast cancer. It measures 7 mm and is located in the upper outer quadrant. It is ER positive, PR negative, and HER2 positive with a Ki67 of 35%. Left lumpectomy and SLNB on 08/09/21 with port placement and 3 negative lymph nodes. Will be having chemotherapy with taxol and herceptin.   PRECAUTIONS: left UE Lymphedema risk, None  SUBJECTIVE: Pt returns for her first 3 month L-Dex screen.   PAIN:  Are you having pain? No  SOZO SCREENING: Patient was assessed today using the SOZO machine to determine the lymphedema index score. This was compared to her baseline score. It was determined that she is within the recommended range when compared to her baseline and no further action is needed at this time. She will continue SOZO screenings. These are done every 3 months for 2 years post operatively followed by every 6 months for 2 years, and then annually.  L-DEX FLOWSHEETS - 11/11/21 0800       L-DEX LYMPHEDEMA SCREENING   Measurement Type Unilateral    L-DEX MEASUREMENT EXTREMITY Upper Extremity    POSITION  Standing    DOMINANT SIDE Right    At Risk Side Left    BASELINE SCORE (UNILATERAL) -2.1    L-DEX SCORE (UNILATERAL) -0.6    VALUE CHANGE (UNILAT) 1.5               Otelia Limes, PTA 11/11/2021, 9:00 AM

## 2021-11-12 ENCOUNTER — Other Ambulatory Visit: Payer: Self-pay

## 2021-11-13 MED FILL — Dexamethasone Sodium Phosphate Inj 100 MG/10ML: INTRAMUSCULAR | Qty: 1 | Status: AC

## 2021-11-14 ENCOUNTER — Other Ambulatory Visit: Payer: Self-pay

## 2021-11-14 ENCOUNTER — Ambulatory Visit
Admission: RE | Admit: 2021-11-14 | Discharge: 2021-11-14 | Disposition: A | Discharge status: Home / Self Care | Payer: No Typology Code available for payment source | Source: Ambulatory Visit | Attending: Radiation Oncology | Admitting: Radiation Oncology

## 2021-11-14 ENCOUNTER — Inpatient Hospital Stay: Payer: No Typology Code available for payment source

## 2021-11-14 ENCOUNTER — Inpatient Hospital Stay (HOSPITAL_BASED_OUTPATIENT_CLINIC_OR_DEPARTMENT_OTHER): Payer: No Typology Code available for payment source | Admitting: Hematology and Oncology

## 2021-11-14 ENCOUNTER — Encounter: Payer: Self-pay | Admitting: Hematology and Oncology

## 2021-11-14 ENCOUNTER — Ambulatory Visit
Admission: RE | Admit: 2021-11-14 | Discharge: 2021-11-14 | Disposition: A | Discharge status: Home / Self Care | Payer: No Typology Code available for payment source | Source: Ambulatory Visit | Attending: Hematology and Oncology | Admitting: Hematology and Oncology

## 2021-11-14 ENCOUNTER — Encounter: Payer: Self-pay | Admitting: Radiation Oncology

## 2021-11-14 ENCOUNTER — Ambulatory Visit: Payer: No Typology Code available for payment source | Admitting: Radiation Oncology

## 2021-11-14 VITALS — BP 133/62 | HR 85 | Temp 99.3°F | Resp 18 | Ht 64.0 in | Wt 142.0 lb

## 2021-11-14 VITALS — BP 102/57 | HR 94 | Temp 98.1°F | Resp 16 | Ht 64.0 in | Wt 142.7 lb

## 2021-11-14 DIAGNOSIS — Z170 Estrogen receptor positive status [ER+]: Secondary | ICD-10-CM | POA: Insufficient documentation

## 2021-11-14 DIAGNOSIS — C50412 Malignant neoplasm of upper-outer quadrant of left female breast: Secondary | ICD-10-CM | POA: Insufficient documentation

## 2021-11-14 DIAGNOSIS — Z79899 Other long term (current) drug therapy: Secondary | ICD-10-CM | POA: Insufficient documentation

## 2021-11-14 DIAGNOSIS — Z803 Family history of malignant neoplasm of breast: Secondary | ICD-10-CM | POA: Insufficient documentation

## 2021-11-14 DIAGNOSIS — I10 Essential (primary) hypertension: Secondary | ICD-10-CM | POA: Insufficient documentation

## 2021-11-14 DIAGNOSIS — Z7982 Long term (current) use of aspirin: Secondary | ICD-10-CM | POA: Insufficient documentation

## 2021-11-14 DIAGNOSIS — K219 Gastro-esophageal reflux disease without esophagitis: Secondary | ICD-10-CM | POA: Insufficient documentation

## 2021-11-14 DIAGNOSIS — Z800 Family history of malignant neoplasm of digestive organs: Secondary | ICD-10-CM | POA: Insufficient documentation

## 2021-11-14 DIAGNOSIS — Z801 Family history of malignant neoplasm of trachea, bronchus and lung: Secondary | ICD-10-CM | POA: Insufficient documentation

## 2021-11-14 DIAGNOSIS — Z95828 Presence of other vascular implants and grafts: Secondary | ICD-10-CM

## 2021-11-14 DIAGNOSIS — Z510 Encounter for antineoplastic radiation therapy: Secondary | ICD-10-CM | POA: Diagnosis not present

## 2021-11-14 DIAGNOSIS — R509 Fever, unspecified: Secondary | ICD-10-CM | POA: Insufficient documentation

## 2021-11-14 LAB — CBC WITH DIFFERENTIAL (CANCER CENTER ONLY)
Abs Immature Granulocytes: 0.03 10*3/uL (ref 0.00–0.07)
Basophils Absolute: 0 10*3/uL (ref 0.0–0.1)
Basophils Relative: 1 %
Eosinophils Absolute: 0.2 10*3/uL (ref 0.0–0.5)
Eosinophils Relative: 4 %
HCT: 33.1 % — ABNORMAL LOW (ref 36.0–46.0)
Hemoglobin: 11.2 g/dL — ABNORMAL LOW (ref 12.0–15.0)
Immature Granulocytes: 1 %
Lymphocytes Relative: 17 %
Lymphs Abs: 0.7 10*3/uL (ref 0.7–4.0)
MCH: 30.9 pg (ref 26.0–34.0)
MCHC: 33.8 g/dL (ref 30.0–36.0)
MCV: 91.2 fL (ref 80.0–100.0)
Monocytes Absolute: 0.5 10*3/uL (ref 0.1–1.0)
Monocytes Relative: 12 %
Neutro Abs: 2.6 10*3/uL (ref 1.7–7.7)
Neutrophils Relative %: 65 %
Platelet Count: 269 10*3/uL (ref 150–400)
RBC: 3.63 MIL/uL — ABNORMAL LOW (ref 3.87–5.11)
RDW: 13.7 % (ref 11.5–15.5)
WBC Count: 3.9 10*3/uL — ABNORMAL LOW (ref 4.0–10.5)
nRBC: 0 % (ref 0.0–0.2)

## 2021-11-14 LAB — CMP (CANCER CENTER ONLY)
ALT: 28 U/L (ref 0–44)
AST: 23 U/L (ref 15–41)
Albumin: 4.2 g/dL (ref 3.5–5.0)
Alkaline Phosphatase: 39 U/L (ref 38–126)
Anion gap: 5 (ref 5–15)
BUN: 13 mg/dL (ref 8–23)
CO2: 27 mmol/L (ref 22–32)
Calcium: 8.7 mg/dL — ABNORMAL LOW (ref 8.9–10.3)
Chloride: 101 mmol/L (ref 98–111)
Creatinine: 0.65 mg/dL (ref 0.44–1.00)
GFR, Estimated: >60 mL/min (ref >60)
Glucose, Bld: 122 mg/dL — ABNORMAL HIGH (ref 70–99)
Potassium: 3.9 mmol/L (ref 3.5–5.1)
Sodium: 133 mmol/L — ABNORMAL LOW (ref 135–145)
Total Bilirubin: 0.9 mg/dL (ref 0.3–1.2)
Total Protein: 6.8 g/dL (ref 6.5–8.1)

## 2021-11-14 MED ORDER — LIDOCAINE-PRILOCAINE 2.5-2.5 % EX CREA
TOPICAL_CREAM | CUTANEOUS | Status: AC
  Filled 2021-11-14: qty 5

## 2021-11-14 MED ORDER — SODIUM CHLORIDE 0.9% FLUSH
10.0000 mL | Freq: Once | INTRAVENOUS | Status: AC
  Administered 2021-11-14: 10 mL

## 2021-11-14 NOTE — Progress Notes (Signed)
This is a 61 year old postmenopausal female patient with newly diagnosed left breast invasive ductal carcinoma [facility] [place] NOTE  Patient Care Team: [name], MD as PCP - General (Internal Medicine) Buford Dresser, MD as PCP - Cardiology (Cardiology) Rockwell Germany, RN as Oncology Nurse Navigator Mauro Kaufmann, RN as Oncology Nurse Navigator Jovita Kussmaul, MD as Consulting Physician (General Surgery) Benay Pike, MD as Consulting Physician (Hematology and Oncology) Kyung Rudd, MD as Consulting Physician (Radiation Oncology)  CHIEF COMPLAINTS/PURPOSE OF CONSULTATION:  Newly diagnosed breast cancer  HISTORY OF PRESENTING ILLNESS:  Christine [name] 61 y.o. female is here because of recent diagnosis of left breast cancer  I reviewed her records extensively and collaborated the history with the patient.  SUMMARY OF ONCOLOGIC HISTORY: Oncology History  Malignant neoplasm of upper-outer quadrant of left breast in female, estrogen receptor positive ([place])  07/17/2021 Mammogram   Suspicious left breast mass at 3 o'clock, 4 cm from the nipple. No axillary adenopathy. Targeted ultrasound is performed, showing an irregular centrally hypoechoic mass with an echogenic rim measuring 7 x 5 by 7 mm, thought to correlate with the mammographically identified mass. This mass is located at 3 o'clock, 4 cm from the nipple. No axillary adenopathy.   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female, estrogen receptor positive ([place])   07/29/2021 Cancer Staging   Staging form: Breast, AJCC 8th Edition - Clinical stage from 07/29/2021: Stage IA (cT1c, cN0, cM0, G3, ER+, PR-, HER2+) - Signed by Hayden Pedro, PA-C on 07/31/2021 Stage prefix: Initial diagnosis Method of lymph node assessment: Clinical Histologic grading system: 3 grade system    Pathology Results   Path showed IDC, grade 3, ER 100%  positive, strong staining intensity, PR negative, ki 67 35%, Her 2 positive   08/05/2021 Genetic Testing   Negative hereditary cancer genetic testing: no pathogenic variants detected in [facility] BRCAPlus Panel and [facility] CustomNext-Cancer +RNAinsight Panel.  Report dates are August 05, 2021 and August 08, 2021.    The BRCAplus panel offered by [employer] and includes sequencing and deletion/duplication analysis for the following 8 genes: ATM, BRCA1, BRCA2, CDH1, CHEK2, PALB2, PTEN, and TP53. The CustomNext-Cancer+RNAinsight panel offered by [employer] includes sequencing and rearrangement analysis for the following 47 genes:  APC, ATM, AXIN2, BARD1, BMPR1A, BRCA1, BRCA2, BRIP1, CDH1, CDK4, CDKN2A, CHEK2, DICER1, EPCAM, GREM1, HOXB13, MEN1, MLH1, MSH2, MSH3, MSH6, MUTYH, NBN, NF1, NF2, NTHL1, PALB2, PMS2, POLD1, POLE, PTEN, RAD51C, RAD51D, RECQL, RET, SDHA, SDHAF2, SDHB, SDHC, SDHD, SMAD4, SMARCA4, STK11, TP53, TSC1, TSC2, and VHL.  RNA data is routinely analyzed for use in variant interpretation for all genes.   09/12/2021 - 10/03/2021 Chemotherapy   Patient is on Treatment Plan : BREAST Paclitaxel + Trastuzumab q7d / Trastuzumab q21d     09/12/2021 -  Chemotherapy   Patient is on Treatment Plan : BREAST Paclitaxel + Trastuzumab q7d / Trastuzumab q21d      She is here for a follow up.  Since last visit, she was noted to have a mild fever this morning.  She also has noted some cold and sore throat and congestion.  No fevers or chills at home.  No change in breathing.  Skin rash has gotten a bit better on the forearms.  She has been applying betamethasone and wearing full sleeves.  No other diarrhea or dysuria.  Rest of the pertinent 10 point ROS reviewed and negative  MEDICAL HISTORY:  Past  Medical History:  Diagnosis Date   Family history of breast cancer 07/31/2021   GERD (gastroesophageal reflux disease)    Hypertension     SURGICAL HISTORY: Past Surgical History:  Procedure Laterality Date    BREAST LUMPECTOMY WITH RADIOACTIVE SEED AND SENTINEL LYMPH NODE BIOPSY Left 08/09/2021   Procedure: LEFT BREAST RADIOACTIVE SEED LOCALIZED LUMPECTOMY AND SENTINEL NODE BIOPSY;  Surgeon: Jovita Kussmaul, MD;  Location: [place] [facility];  Service: General;  Laterality: Left;  GEN & PEC BLOCK   CESAREAN SECTION     FOOT SURGERY Right    PORTACATH PLACEMENT Right 08/09/2021   Procedure: PORT PLACEMENT RIGHT  INTERNAL JUGULAR  WITH ULTRASOUND GUIDANCE;  Surgeon: Jovita Kussmaul, MD;  Location: [place];  Service: General;  Laterality: Right;  RIGHT INTERNAL JUGULAR PLACEMENT    SOCIAL HISTORY: Social History   Socioeconomic History   Marital status: Married    Spouse name: Not on file   Number of children: Not on file   Years of education: Not on file   Highest education level: Not on file  Occupational History   Not on file  Tobacco Use   Smoking status: Never   Smokeless tobacco: Never  Vaping Use   Vaping Use: Never used  Substance and Sexual Activity   Alcohol use: Yes    Alcohol/week: 2.0 standard drinks of alcohol    Types: 2 Glasses of wine per week   Drug use: Never   Sexual activity: Not on file  Other Topics Concern   Not on file  Social History Narrative   Not on file   Social Determinants of Health   Financial Resource Strain: Not on file  Food Insecurity: Not on file  Transportation Needs: Not on file  Physical Activity: Not on file  Stress: Not on file  Social Connections: Not on file  Intimate Partner Violence: Not on file    FAMILY HISTORY: Family History  Problem Relation Age of Onset   Heart disease Father    Breast cancer Sister 63   Esophageal cancer Maternal Uncle        dx after age 7   Lung cancer Paternal Uncle    Skin cancer Paternal Uncle     ALLERGIES:  is allergic to lisinopril and mefloquine.  MEDICATIONS:  Current Outpatient Medications  Medication Sig Dispense Refill   acetaminophen (TYLENOL) 500 MG tablet Take 1,000 mg by mouth every 6 (six) hours  as needed for moderate pain.     aspirin EC 81 MG tablet Take 81 mg by mouth daily. Swallow whole.     betamethasone valerate ointment (VALISONE) 0.1 % Apply 1 Application topically 2 (two) times daily. 30 g 0   Cholecalciferol (DIALYVITE VITAMIN D 5000) 125 MCG (5000 UT) capsule Take 15,000 Units by mouth every Monday.     lidocaine-prilocaine (EMLA) cream Apply to affected area once 30 g 3   MAGNESIUM PO Take 150 mg by mouth 3 (three) times a week.     nitroGLYCERIN (NITROSTAT) 0.4 MG SL tablet Place 1 tablet (0.4 mg total) under the tongue every 5 (five) minutes as needed for chest pain. 90 tablet 3   Omega-3 Fatty Acids (OMEGA 3 500) 500 MG CAPS Take 500 mg by mouth daily.     omeprazole (PRILOSEC) 40 MG capsule Take 40 mg by mouth every evening.     ondansetron (ZOFRAN) 8 MG tablet Take 1 tablet (8 mg total) by mouth every 8 (eight) hours as needed for nausea or  vomiting. 30 tablet 1   oxyCODONE (ROXICODONE) 5 MG immediate release tablet Take 1 tablet (5 mg total) by mouth every 6 (six) hours as needed for severe pain. 15 tablet 0   prochlorperazine (COMPAZINE) 10 MG tablet Take 1 tablet (10 mg total) by mouth every 6 (six) hours as needed for nausea or vomiting. 30 tablet 1   rosuvastatin (CRESTOR) 40 MG tablet TAKE 1 TABLET BY MOUTH EVERY DAY 90 tablet 1   valsartan-hydrochlorothiazide (DIOVAN-HCT) 80-12.5 MG tablet Take 1 tablet by mouth daily. 90 tablet 3   zolpidem (AMBIEN) 10 MG tablet Take 5-10 mg by mouth at bedtime as needed for sleep.     No current facility-administered medications for this visit.   Facility-Administered Medications Ordered in Other Visits  Medication Dose Route Frequency Provider Last Rate Last Admin   lidocaine-prilocaine (EMLA) 2.5-2.5 % cream            PHYSICAL EXAMINATION: ECOG PERFORMANCE STATUS: 0 - Asymptomatic  BP (!) 102/57 (BP Location: Right Arm, Patient Position: Sitting)   Pulse 94   Temp 98.1 F (36.7 C) (Temporal)   Resp 16   Ht 5' 4"   (1.626 m)   Wt 142 lb 11.2 oz (64.7 kg)   SpO2 99%   BMI 24.49 kg/m   Physical Exam Constitutional:      Appearance: Normal appearance.  Cardiovascular:     Rate and Rhythm: Normal rate and regular rhythm.  Abdominal:     General: Abdomen is flat.     Palpations: Abdomen is soft.  Musculoskeletal:     Cervical back: Normal range of motion and neck supple. No rigidity.  Lymphadenopathy:     Cervical: No cervical adenopathy.  Skin:    General: Skin is warm.  Neurological:     General: No focal deficit present.     Mental Status: She is alert.  Psychiatric:        Mood and Affect: Mood normal.    LABORATORY DATA:  I have reviewed the data as listed Lab Results  Component Value Date   WBC 3.9 (L) 11/14/2021   HGB 11.2 (L) 11/14/2021   HCT 33.1 (L) 11/14/2021   MCV 91.2 11/14/2021   PLT 269 11/14/2021   Lab Results  Component Value Date   NA 133 (L) 11/14/2021   K 3.9 11/14/2021   CL 101 11/14/2021   CO2 27 11/14/2021    RADIOGRAPHIC STUDIES: I have personally reviewed the radiological reports and agreed with the findings in the report.  ASSESSMENT AND PLAN:   Malignant neoplasm of upper-outer quadrant of left breast in female, estrogen receptor positive ([place]) Renelda is a 61 year old woman with stage Ia triple positive breast cancer status postlumpectomy and currently undergoing adjuvant chemotherapy with Taxol and Herceptin. She is currently undergoing adjuvant Herceptin and Taxol, had noted a mild fever this morning at 99 F.  She also has noticed some cold and congestion, sore throat.  She has her 79-1/2-year-old grandson who goes to her daycare.  She otherwise denies any change in breathing, dysuria or diarrhea.  Skin rash on the forearms has gotten a bit better.  She also appears warm to touch today on exam.  We have discussed about delaying the cycle of chemotherapy.  She is okay with this plan.  She will proceed with chemo next week as planned, last cycle of  Taxol now anticipated on November 2. We have reviewed most recent echocardiogram which showed an EF of 60 to 65%, previous echo showed  an EF of 65 to 70%, no overt change.  Global longitudinal strain is normal. We will repeat echo in 3 months.  Total time spent: 15 minutes including history, review of records, counseling and coordination of care. All questions were answered. The patient knows to call the clinic with any problems, questions or concerns.    Benay Pike, MD 11/14/21

## 2021-11-14 NOTE — Assessment & Plan Note (Signed)
Christine [name] is a 61 year old woman with stage Ia triple positive breast cancer status postlumpectomy and currently undergoing adjuvant chemotherapy with Taxol and Herceptin. She is currently undergoing adjuvant Herceptin and Taxol, had noted a mild fever this morning at 99 F.  She also has noticed some cold and congestion, sore throat.  She has her 55-1/2-year-old grandson who goes to her daycare.  She otherwise denies any change in breathing, dysuria or diarrhea.  Skin rash on the forearms has gotten a bit better.  She also appears warm to touch today on exam.  We have discussed about delaying the cycle of chemotherapy.  She is okay with this plan.  She will proceed with chemo next week as planned, last cycle of Taxol now anticipated on November 2. We have reviewed most recent echocardiogram which showed an EF of 60 to 65%, previous echo showed an EF of 65 to 70%, no overt change.  Global longitudinal strain is normal. We will repeat echo in 3 months.

## 2021-11-14 NOTE — Progress Notes (Signed)
Nursing consult for 61 year old female w/ Malignant neoplasm of upper-outer quadrant of left breast in female, estrogen receptor positive (Christine [name]).  Patient reports LT sided flank tenderness 2/10. No other related issues reported at this time.  Meaningful use complete. Postmenopausal- NO chances of pregnancy.  BP 133/62 (BP Location: Right Arm, Patient Position: Sitting, Cuff Size: Normal)   Pulse 85   Temp 99.3 F (37.4 C) (Oral)   Resp 18   Ht '5\' 4"'$  (1.626 m)   Wt 142 lb (64.4 kg)   SpO2 100%   BMI 24.37 kg/m

## 2021-11-14 NOTE — Progress Notes (Signed)
Radiation Oncology         [phone] ________________________________  Name: Christine [name]        MRN: [id number]  Date of Service: 11/14/2021 DOB: [birth year]  LK:[id number], Herbie [place], MD  Benay Pike, MD     REFERRING PHYSICIAN: Benay Pike, MD   DIAGNOSIS: The encounter diagnosis was Malignant neoplasm of upper-outer quadrant of left breast in female, estrogen receptor positive ([place]).   HISTORY OF PRESENT ILLNESS: Christine [name] is a 61 y.o. female originally seen in the multidisciplinary breast clinic for a new diagnosis of left breast cancer. The patient was noted to have a screening detected mass in the left breast.  Further diagnostic work-up showed a mass in the lateral aspect of the left breast and by ultrasound this measured 7 mm in the 3 o'clock position.  No evidence of axillary adenopathy was appreciated.  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  Her cancer was ER positive PR negative, HER2 was equivocal by IHC and reflexed to positive FISH.    Since her last visit she underwent a left lumpectomy with sentinel node biopsy on 08/09/21 that showed a 1.2 cm, grade 3 invasive ductal carcinoma. Her 3 sampled nodes were negative for disease, and her closest margin was <1 mm to the lateral margin. She began systemic chemotherapy with targeted HER2 on 09/12/21 and is due to complete chemotherapy on 11/28/21. She's seen today to discuss adjuvant radiotherapy after completion of chemotherapy.     PREVIOUS RADIATION THERAPY: No   PAST MEDICAL HISTORY:  Past Medical History:  Diagnosis Date   Family history of breast cancer 07/31/2021   GERD (gastroesophageal reflux disease)    Hypertension        PAST SURGICAL HISTORY: Past Surgical History:  Procedure Laterality Date   BREAST LUMPECTOMY WITH RADIOACTIVE SEED AND SENTINEL LYMPH NODE BIOPSY Left 08/09/2021   Procedure: LEFT BREAST RADIOACTIVE SEED  LOCALIZED LUMPECTOMY AND SENTINEL NODE BIOPSY;  Surgeon: Jovita Kussmaul, MD;  Location: [facility];  Service: General;  Laterality: Left;  GEN & PEC BLOCK   CESAREAN SECTION     FOOT SURGERY Right    PORTACATH PLACEMENT Right 08/09/2021   Procedure: PORT PLACEMENT RIGHT  INTERNAL JUGULAR  WITH ULTRASOUND GUIDANCE;  Surgeon: Jovita Kussmaul, MD;  Location: [facility];  Service: General;  Laterality: Right;  RIGHT INTERNAL JUGULAR PLACEMENT     FAMILY HISTORY:  Family History  Problem Relation Age of Onset   Heart disease Father    Breast cancer Sister 1   Esophageal cancer Maternal Uncle        dx after age 22   Lung cancer Paternal Uncle    Skin cancer Paternal Uncle      SOCIAL HISTORY:  reports that she has never smoked. She has never used smokeless tobacco. She reports current alcohol use of about 2.0 standard drinks of alcohol per week. She reports that she does not use drugs. The patient is married and lives in [place]. 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   ALLERGIES: Lisinopril and Mefloquine   MEDICATIONS:  Current Outpatient Medications  Medication Sig Dispense Refill   acetaminophen (TYLENOL) 500 MG tablet Take 1,000 mg by mouth every 6 (six) hours as needed for moderate pain.     aspirin EC 81 MG tablet Take 81 mg by  mouth daily. Swallow whole.     betamethasone valerate ointment (VALISONE) 0.1 % Apply 1 Application topically 2 (two) times daily. 30 g 0   Cholecalciferol (DIALYVITE VITAMIN D 5000) 125 MCG (5000 UT) capsule Take 15,000 Units by mouth every Monday.     lidocaine-prilocaine (EMLA) cream Apply to affected area once 30 g 3   MAGNESIUM PO Take 150 mg by mouth 3 (three) times a week.     nitroGLYCERIN (NITROSTAT) 0.4 MG SL tablet Place 1 tablet (0.4 mg total) under the tongue every 5 (five) minutes as needed for chest pain. 90 tablet 3    Omega-3 Fatty Acids (OMEGA 3 500) 500 MG CAPS Take 500 mg by mouth daily.     omeprazole (PRILOSEC) 40 MG capsule Take 40 mg by mouth every evening.     ondansetron (ZOFRAN) 8 MG tablet Take 1 tablet (8 mg total) by mouth every 8 (eight) hours as needed for nausea or vomiting. 30 tablet 1   oxyCODONE (ROXICODONE) 5 MG immediate release tablet Take 1 tablet (5 mg total) by mouth every 6 (six) hours as needed for severe pain. 15 tablet 0   prochlorperazine (COMPAZINE) 10 MG tablet Take 1 tablet (10 mg total) by mouth every 6 (six) hours as needed for nausea or vomiting. 30 tablet 1   rosuvastatin (CRESTOR) 40 MG tablet TAKE 1 TABLET BY MOUTH EVERY DAY 90 tablet 1   valsartan-hydrochlorothiazide (DIOVAN-HCT) 80-12.5 MG tablet Take 1 tablet by mouth daily. 90 tablet 3   zolpidem (AMBIEN) 10 MG tablet Take 5-10 mg by mouth at bedtime as needed for sleep.     No current facility-administered medications for this encounter.   Facility-Administered Medications Ordered in Other Encounters  Medication Dose Route Frequency Provider Last Rate Last Admin   lidocaine-prilocaine (EMLA) 2.5-2.5 % cream              REVIEW OF SYSTEMS: On review of systems, the patient reports that she is doing well since our last visit. She's had fatigue, skin rash, and peripheral neuropathy during her course of therapy and reports that the last two weeks have been more difficult than during the first few infusions. She denies any chest pain or fevers, or productive mucous, changes of upper or lower respiratory drainage, or dysuria. No other complaints are verbalized.      PHYSICAL EXAM:  Wt Readings from Last 3 Encounters:  11/14/21 142 lb (64.4 kg)  11/11/21 141 lb 8 oz (64.2 kg)  11/07/21 143 lb 8 oz (65.1 kg)   Temp Readings from Last 3 Encounters:  11/14/21 99.3 F (37.4 C) (Oral)  11/07/21 (!) 97.5 F (36.4 C) (Oral)  10/31/21 (!) 97.3 F (36.3 C) (Temporal)   BP Readings from Last 3 Encounters:  11/14/21  133/62  11/07/21 123/60  10/31/21 126/75   Pulse Readings from Last 3 Encounters:  11/14/21 85  11/07/21 66  10/31/21 70    In general this is a well appearing caucasian female in no acute distress. She's alert and oriented x4 and appropriate throughout the examination. Cardiopulmonary assessment is negative for acute distress and she exhibits normal effort. Her right chest wall PAC site is intact. Her left breast incision is well healed and similar incision is well healed in her left axilla. Evidence of mag trace injection is noted in the breast, but no erythema, separation or drainage is noted of her incision.     ECOG = 0  0 - Asymptomatic (Fully active, able to carry  on all predisease activities without restriction)  1 - Symptomatic but completely ambulatory (Restricted in physically strenuous activity but ambulatory and able to carry out work of a light or sedentary nature. For example, light housework, office work)  2 - Symptomatic, <50% in bed during the day (Ambulatory and capable of all self care but unable to carry out any work activities. Up and about more than 50% of waking hours)  3 - Symptomatic, >50% in bed, but not bedbound (Capable of only limited self-care, confined to bed or chair 50% or more of waking hours)  4 - Bedbound (Completely disabled. Cannot carry on any self-care. Totally confined to bed or chair)  5 - Death   Eustace Pen MM, Creech RH, Tormey DC, et al. [phone]). "Toxicity and response criteria of the [facility]". [place] Oncol. 5 (6): 649-55    LABORATORY DATA:  Lab Results  Component Value Date   WBC 3.9 (L) 11/14/2021   HGB 11.2 (L) 11/14/2021   HCT 33.1 (L) 11/14/2021   MCV 91.2 11/14/2021   PLT 269 11/14/2021   Lab Results  Component Value Date   NA 133 (L) 11/14/2021   K 3.9 11/14/2021   CL 101 11/14/2021   CO2 27 11/14/2021   Lab Results  Component Value Date   ALT 28 11/14/2021   AST 23 11/14/2021   ALKPHOS  39 11/14/2021   BILITOT 0.9 11/14/2021      RADIOGRAPHY: ECHOCARDIOGRAM COMPLETE  Result Date: 11/08/2021    ECHOCARDIOGRAM REPORT   Patient Name:   Christine [name] Date of Exam: 11/08/2021 Medical Rec #:  [id number]         Height:       64.0 in Accession #:    [id number]        Weight:       143.5 lb Date of Birth:  [birth year]         BSA:          1.699 m Patient Age:    46 years          BP:           136/64 mmHg Patient Gender: F                 HR:           58 bpm. Exam Location:  Outpatient Procedure: 2D Echo, 3D Echo and Strain Analysis Indications:    Chemotherapy  History:        Patient has prior history of Echocardiogram examinations, most                 recent 08/07/2021. Risk Factors:Hypertension.  Sonographer:    Jefferey Pica Referring Phys: [place]  1. Left ventricular ejection fraction, by estimation, is 60 to 65%. The left ventricle has normal function. The left ventricle has no regional wall motion abnormalities. There is mild left ventricular hypertrophy. Left ventricular diastolic parameters are consistent with Grade I diastolic dysfunction (impaired relaxation). The average left ventricular global longitudinal strain is -28.0 %. The global longitudinal strain is normal.  2. Right ventricular systolic function is normal. The right ventricular size is normal. There is normal pulmonary artery systolic pressure.  3. Left atrial size was moderately dilated.  4. The mitral valve is normal in structure. Trivial mitral valve regurgitation.  5. The aortic valve is tricuspid. Aortic valve regurgitation is not visualized.  6. The inferior vena cava is normal in size  with greater than 50% respiratory variability, suggesting right atrial pressure of 3 mmHg. Comparison(s): No significant change from prior study. FINDINGS  Left Ventricle: Left ventricular ejection fraction, by estimation, is 60 to 65%. The left ventricle has normal function. The left ventricle has  no regional wall motion abnormalities. The average left ventricular global longitudinal strain is -28.0 %. The global longitudinal strain is normal. The left ventricular internal cavity size was normal in size. There is mild left ventricular hypertrophy. Left ventricular diastolic parameters are consistent with Grade I diastolic dysfunction (impaired relaxation). Right Ventricle: The right ventricular size is normal. No increase in right ventricular wall thickness. Right ventricular systolic function is normal. There is normal pulmonary artery systolic pressure. The tricuspid regurgitant velocity is 2.67 m/s, and  with an assumed right atrial pressure of 3 mmHg, the estimated right ventricular systolic pressure is 46.9 mmHg. Left Atrium: Left atrial size was moderately dilated. Right Atrium: Right atrial size was normal in size. Pericardium: There is no evidence of pericardial effusion. Mitral Valve: The mitral valve is normal in structure. Trivial mitral valve regurgitation. Tricuspid Valve: The tricuspid valve is normal in structure. Tricuspid valve regurgitation is mild. Aortic Valve: The aortic valve is tricuspid. Aortic valve regurgitation is not visualized. Aortic valve peak gradient measures 11.2 mmHg. Pulmonic Valve: The pulmonic valve was normal in structure. Pulmonic valve regurgitation is not visualized. Aorta: The aortic root and ascending aorta are structurally normal, with no evidence of dilitation. Venous: The inferior vena cava is normal in size with greater than 50% respiratory variability, suggesting right atrial pressure of 3 mmHg. IAS/Shunts: No atrial level shunt detected by color flow Doppler.  LEFT VENTRICLE PLAX 2D LVIDd:         3.90 cm   Diastology LVIDs:         2.30 cm   LV e' medial:    5.59 cm/s LV PW:         1.10 cm   LV E/e' medial:  14.5 LV IVS:        1.20 cm   LV e' lateral:   6.89 cm/s LVOT diam:     2.00 cm   LV E/e' lateral: 11.7 LV SV:         101 LV SV Index:   60        2D  Longitudinal Strain LVOT Area:     3.14 cm  2D Strain GLS Avg:     -28.0 %  RIGHT VENTRICLE             IVC RV Basal diam:  2.50 cm     IVC diam: 1.90 cm RV S prime:     18.30 cm/s TAPSE (M-mode): 2.6 cm LEFT ATRIUM             Index        RIGHT ATRIUM           Index LA diam:        4.30 cm 2.53 cm/m   RA Area:     16.10 cm LA Vol (A2C):   66.6 ml 39.20 ml/m  RA Volume:   40.30 ml  23.72 ml/m LA Vol (A4C):   73.7 ml 43.38 ml/m LA Biplane Vol: 74.6 ml 43.91 ml/m  AORTIC VALVE                 PULMONIC VALVE AV Area (Vmax): 2.45 cm     PV Vmax:       0.98 m/s  AV Vmax:        167.00 cm/s  PV Peak grad:  3.9 mmHg AV Peak Grad:   11.2 mmHg LVOT Vmax:      130.00 cm/s LVOT Vmean:     83.400 cm/s LVOT VTI:       0.322 m  AORTA Ao Root diam: 3.00 cm Ao Asc diam:  3.20 cm MITRAL VALVE                TRICUSPID VALVE MV Area (PHT): 3.81 cm     TR Peak grad:   28.5 mmHg MV Decel Time: 199 msec     TR Vmax:        267.00 cm/s MV E velocity: 80.90 cm/s MV A velocity: 116.00 cm/s  SHUNTS MV E/A ratio:  0.70         Systemic VTI:  0.32 m                             Systemic Diam: 2.00 cm Phineas Inches Electronically signed by Phineas Inches Signature Date/Time: 11/08/2021/1:24:53 PM    Final        IMPRESSION/PLAN: 1. Stage IA, [id number], grade 3, ER positive, HER2 amplified invasive ductal carcinoma of the left breast. Dr. Lisbeth Renshaw has reviewed her final pathology findings and I reviewed the nature of early stage breast disease. She's done well since surgery and has a few more infusions to complete of chemotherapy. We reviewed the rationale for external radiotherapy to the breast  to reduce risks of local recurrence followed by antiestrogen therapy. We discussed the risks, benefits, short, and long term effects of radiotherapy, as well as the curative intent, and the patient is interested in proceeding. I reviewed  the delivery and logistics of radiotherapy and Dr. Lisbeth Renshaw recommends 4 weeks of radiotherapy to the left breast  with deep inspiration breath hold technique. Written consent is obtained and placed in the chart, a copy was provided to the patient. We have moved her simulation to the week of 12/30/21 so she can complete chemotherapy and start radiation no sooner than 4 weeks after her last chemotherapy infusion.  2. Elevated temperature. The patient denies symptoms of infection, but I will reach out to Dr. Chryl Heck to make sure she's aware of this data point. The patient will try to be worked in earlier as well for her lab visit to check her blood counts.     In a visit lasting 45 minutes, greater than 50% of the time was spent face to face reviewing her case, as well as in preparation of, discussing, and coordinating the patient's care.     Carola Rhine, [facility]    **Disclaimer: This note was dictated with voice recognition software. Similar sounding words can inadvertently be transcribed and this note may contain transcription errors which may not have been corrected upon publication of note.**

## 2021-11-19 ENCOUNTER — Encounter: Payer: Self-pay | Admitting: Hematology and Oncology

## 2021-11-20 MED FILL — Dexamethasone Sodium Phosphate Inj 100 MG/10ML: INTRAMUSCULAR | Qty: 1 | Status: AC

## 2021-11-21 ENCOUNTER — Inpatient Hospital Stay: Payer: No Typology Code available for payment source

## 2021-11-21 ENCOUNTER — Inpatient Hospital Stay: Payer: No Typology Code available for payment source | Admitting: Adult Health

## 2021-11-21 VITALS — BP 119/66 | HR 71 | Temp 98.6°F | Resp 16 | Wt 143.8 lb

## 2021-11-21 DIAGNOSIS — Z95828 Presence of other vascular implants and grafts: Secondary | ICD-10-CM

## 2021-11-21 DIAGNOSIS — Z170 Estrogen receptor positive status [ER+]: Secondary | ICD-10-CM

## 2021-11-21 DIAGNOSIS — Z510 Encounter for antineoplastic radiation therapy: Secondary | ICD-10-CM | POA: Diagnosis not present

## 2021-11-21 LAB — CMP (CANCER CENTER ONLY)
ALT: 25 U/L (ref 0–44)
AST: 24 U/L (ref 15–41)
Albumin: 4.1 g/dL (ref 3.5–5.0)
Alkaline Phosphatase: 43 U/L (ref 38–126)
Anion gap: 5 (ref 5–15)
BUN: 10 mg/dL (ref 8–23)
CO2: 26 mmol/L (ref 22–32)
Calcium: 8.9 mg/dL (ref 8.9–10.3)
Chloride: 106 mmol/L (ref 98–111)
Creatinine: 0.67 mg/dL (ref 0.44–1.00)
GFR, Estimated: >60 mL/min (ref >60)
Glucose, Bld: 100 mg/dL — ABNORMAL HIGH (ref 70–99)
Potassium: 3.7 mmol/L (ref 3.5–5.1)
Sodium: 137 mmol/L (ref 135–145)
Total Bilirubin: 0.9 mg/dL (ref 0.3–1.2)
Total Protein: 6.5 g/dL (ref 6.5–8.1)

## 2021-11-21 LAB — CBC WITH DIFFERENTIAL (CANCER CENTER ONLY)
Abs Immature Granulocytes: 0.02 10*3/uL (ref 0.00–0.07)
Basophils Absolute: 0.1 10*3/uL (ref 0.0–0.1)
Basophils Relative: 1 %
Eosinophils Absolute: 0.2 10*3/uL (ref 0.0–0.5)
Eosinophils Relative: 3 %
HCT: 30.3 % — ABNORMAL LOW (ref 36.0–46.0)
Hemoglobin: 10.4 g/dL — ABNORMAL LOW (ref 12.0–15.0)
Immature Granulocytes: 0 %
Lymphocytes Relative: 22 %
Lymphs Abs: 1.4 10*3/uL (ref 0.7–4.0)
MCH: 30.9 pg (ref 26.0–34.0)
MCHC: 34.3 g/dL (ref 30.0–36.0)
MCV: 89.9 fL (ref 80.0–100.0)
Monocytes Absolute: 0.6 10*3/uL (ref 0.1–1.0)
Monocytes Relative: 9 %
Neutro Abs: 4.2 10*3/uL (ref 1.7–7.7)
Neutrophils Relative %: 65 %
Platelet Count: 325 10*3/uL (ref 150–400)
RBC: 3.37 MIL/uL — ABNORMAL LOW (ref 3.87–5.11)
RDW: 13.6 % (ref 11.5–15.5)
WBC Count: 6.4 10*3/uL (ref 4.0–10.5)
nRBC: 0 % (ref 0.0–0.2)

## 2021-11-21 MED ORDER — HEPARIN SOD (PORK) LOCK FLUSH 100 UNIT/ML IV SOLN
500.0000 [IU] | Freq: Once | INTRAVENOUS | Status: AC | PRN
  Administered 2021-11-21: 500 [IU]

## 2021-11-21 MED ORDER — SODIUM CHLORIDE 0.9% FLUSH
10.0000 mL | Freq: Once | INTRAVENOUS | Status: AC
  Administered 2021-11-21: 10 mL

## 2021-11-21 MED ORDER — SODIUM CHLORIDE 0.9 % IV SOLN
10.0000 mg | Freq: Once | INTRAVENOUS | Status: AC
  Administered 2021-11-21: 10 mg via INTRAVENOUS
  Filled 2021-11-21: qty 10
  Filled 2021-11-21: qty 1

## 2021-11-21 MED ORDER — FAMOTIDINE IN NACL 20-0.9 MG/50ML-% IV SOLN
20.0000 mg | Freq: Once | INTRAVENOUS | Status: AC
  Administered 2021-11-21: 20 mg via INTRAVENOUS
  Filled 2021-11-21: qty 50

## 2021-11-21 MED ORDER — ACETAMINOPHEN 325 MG PO TABS
650.0000 mg | ORAL_TABLET | Freq: Once | ORAL | Status: AC
  Administered 2021-11-21: 650 mg via ORAL
  Filled 2021-11-21: qty 2

## 2021-11-21 MED ORDER — HEPARIN SOD (PORK) LOCK FLUSH 100 UNIT/ML IV SOLN: 500.0000 [IU] | Freq: Once | INTRAVENOUS | Status: DC

## 2021-11-21 MED ORDER — SODIUM CHLORIDE 0.9% FLUSH
10.0000 mL | INTRAVENOUS | Status: DC | PRN
  Administered 2021-11-21: 10 mL

## 2021-11-21 MED ORDER — SODIUM CHLORIDE 0.9 % IV SOLN
65.0000 mg/m2 | Freq: Once | INTRAVENOUS | Status: AC
  Administered 2021-11-21: 108 mg via INTRAVENOUS
  Filled 2021-11-21: qty 18

## 2021-11-21 MED ORDER — TRASTUZUMAB-DKST CHEMO 150 MG IV SOLR
2.0000 mg/kg | Freq: Once | INTRAVENOUS | Status: AC
  Administered 2021-11-21: 126 mg via INTRAVENOUS
  Filled 2021-11-21: qty 6

## 2021-11-21 MED ORDER — CETIRIZINE HCL 10 MG/ML IV SOLN
10.0000 mg | Freq: Once | INTRAVENOUS | Status: AC
  Administered 2021-11-21: 10 mg via INTRAVENOUS
  Filled 2021-11-21: qty 1

## 2021-11-21 MED ORDER — SODIUM CHLORIDE 0.9 % IV SOLN: Freq: Once | INTRAVENOUS | Status: AC

## 2021-11-21 NOTE — Patient Instructions (Signed)
[facility] MEDICAL ONCOLOGY  Discharge Instructions: Thank you for choosing [facility] to provide your oncology and hematology care.   If you have a lab appointment with the Cancer Center, please go directly to the Cancer Center and check in at the registration area.   Wear comfortable clothing and clothing appropriate for easy access to any Portacath or PICC line.   We strive to give you quality time with your provider. You may need to reschedule your appointment if you arrive late (15 or more minutes).  Arriving late affects you and other patients whose appointments are after yours.  Also, if you miss three or more appointments without notifying the office, you may be dismissed from the clinic at the provider's discretion.      For prescription refill requests, have your pharmacy contact our office and allow 72 hours for refills to be completed.    Today you received the following chemotherapy and/or immunotherapy agents: trastuzumab-dkst, paclitaxel      To help prevent nausea and vomiting after your treatment, we encourage you to take your nausea medication as directed.  BELOW ARE SYMPTOMS THAT SHOULD BE REPORTED IMMEDIATELY: *FEVER GREATER THAN 100.4 F (38 C) OR HIGHER *CHILLS OR SWEATING *NAUSEA AND VOMITING THAT IS NOT CONTROLLED WITH YOUR NAUSEA MEDICATION *UNUSUAL SHORTNESS OF BREATH *UNUSUAL BRUISING OR BLEEDING *URINARY PROBLEMS (pain or burning when urinating, or frequent urination) *BOWEL PROBLEMS (unusual diarrhea, constipation, pain near the anus) TENDERNESS IN MOUTH AND THROAT WITH OR WITHOUT PRESENCE OF ULCERS (sore throat, sores in mouth, or a toothache) UNUSUAL RASH, SWELLING OR PAIN  UNUSUAL VAGINAL DISCHARGE OR ITCHING   Items with * indicate a potential emergency and should be followed up as soon as possible or go to the Emergency Department if any problems should occur.  Please show the CHEMOTHERAPY ALERT CARD or IMMUNOTHERAPY ALERT  CARD at check-in to the Emergency Department and triage nurse.  Should you have questions after your visit or need to cancel or reschedule your appointment, please contact [facility]ICAL [facility]COL[facility]  Dept: [phone]  and follow the prompts.  Office hours are 8:00 a.m. to 4:30 p.m. Monday - Friday. Please note that voicemails left after 4:00 p.m. may not be returned until the following business day.  We are closed weekends and major holidays. You have access to a nurse at all times for urgent questions. Please call the main number to the clinic Dept: [phone] and follow the prompts.   For any non-urgent questions, you may also contact your provider using MyChart. We now offer e-Visits for anyone 18 and older to request care online for non-urgent symptoms. For details visit [email or web address].   Also download the MyChart app! Go to the app store, search "MyChart", open the app, select [facility], and log in with your MyChart username and password.  Masks are optional in the cancer centers. If you would like for your care team to wear a mask while they are taking care of you, please let them know. You may have one support person who is at least 61 years old accompany you for your appointments. 

## 2021-11-22 ENCOUNTER — Encounter: Payer: Self-pay | Admitting: Physical Therapy

## 2021-11-25 ENCOUNTER — Ambulatory Visit: Payer: No Typology Code available for payment source | Admitting: Physical Therapy

## 2021-11-25 DIAGNOSIS — Z483 Aftercare following surgery for neoplasm: Secondary | ICD-10-CM

## 2021-11-25 NOTE — Therapy (Signed)
OUTPATIENT PHYSICAL THERAPY [facility] SCREENING NOTE   Patient Name: Christine [name] MRN: [id number] DOB:Aug 15, 1960, 61 y.o., female Today's Date: 11/25/2021  PCP: Christine Huddle, MD REFERRING PROVIDER: Jovita Kussmaul, MD    Past Medical History:  Diagnosis Date   Family history of breast cancer 07/31/2021   GERD (gastroesophageal reflux disease)    Hypertension    Past Surgical History:  Procedure Laterality Date   BREAST LUMPECTOMY WITH RADIOACTIVE SEED AND SENTINEL LYMPH NODE BIOPSY Left 08/09/2021   Procedure: LEFT BREAST RADIOACTIVE SEED LOCALIZED LUMPECTOMY AND SENTINEL NODE BIOPSY;  Surgeon: Christine Kussmaul, MD;  Location: [facility];  Service: General;  Laterality: Left;  GEN & PEC BLOCK   CESAREAN SECTION     FOOT SURGERY Right    PORTACATH PLACEMENT Right 08/09/2021   Procedure: PORT PLACEMENT RIGHT  INTERNAL JUGULAR  WITH ULTRASOUND GUIDANCE;  Surgeon: Christine Kussmaul, MD;  Location: [facility];  Service: General;  Laterality: Right;  RIGHT INTERNAL JUGULAR PLACEMENT   Patient Active Problem List   Diagnosis Date Noted   Antineoplastic chemotherapy induced anemia 10/18/2021   Port-A-Cath in place 09/12/2021   Iron deficiency anemia 09/02/2021   Genetic testing 08/05/2021   Family history of breast cancer 07/31/2021   Atherosclerotic heart disease 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 07/31/2021   Easy bruising 07/31/2021   Essential hypertension 07/31/2021   Hearing loss in left ear 07/31/2021   Insomnia 07/31/2021   Mixed hyperlipidemia 07/31/2021   Other long term (current) drug therapy 07/31/2021   Sleep disorder 07/31/2021   Tendency toward bleeding easily ([place]) 07/31/2021   Vitamin D deficiency 07/31/2021   Malignant neoplasm of upper-outer quadrant of left breast in female, estrogen receptor positive ([place]) 07/25/2021   Abnormal finding on mammography 07/10/2021   GERD (gastroesophageal reflux disease) 08/31/2019   Globus pharyngeus 08/02/2019   Rhinitis,  chronic 08/02/2019   Elevated blood pressure reading 01/26/2019   Acute pain of right knee 10/26/2017    REFERRING DIAG: left breast cancer at risk for lymphedema  THERAPY DIAG:  Aftercare following surgery for neoplasm  PERTINENT HISTORY: Patient was diagnosed on 07/08/2021 with left grade 3 invasive ductal carcinoma breast cancer. It measures 7 mm and is located in the upper outer quadrant. It is ER positive, PR negative, and HER2 positive with a Ki67 of 35%. Left lumpectomy and SLNB on 08/09/21 with port placement and 3 negative lymph nodes. Underwent chemotherapy with taxol and herceptin.   PRECAUTIONS: left UE Lymphedema risk  SUBJECTIVE: Patient requested another SOZO screen (last one was 11/11/2021) because her massage therapist notice swelling in her axillary region.  PAIN:  Are you having pain? No  LYMPHEDEMA ASSESSMENTS:    LANDMARK RIGHT   eval RIGHT 09/02/21  10 cm proximal to olecranon process 24 25  Olecranon process 22 22  10  cm proximal to ulnar styloid process 20 19  Just proximal to ulnar styloid process 13.6 14  Across hand at thumb web space 17.5 17.5  At base of 2nd digit 5.8 5.8  (Blank rows = not tested)   LANDMARK LEFT   eval LEFT 09/02/21 LEFT 09/17/21 LEFT 11/25/2021  10 cm proximal to olecranon process 24.8 25 25  24.3  Olecranon process 21.7 22   21.7  10 cm proximal to ulnar styloid process 19.4 20   18.6  Just proximal to ulnar styloid process 13.4 13.5   13.3  Across hand at thumb web space 17.5 17.5   17.7  At base of  2nd digit 5.8 5.8   5.6  (Blank rows = not tested)     L-DEX FLOWSHEETS - 11/25/21 1500       L-DEX LYMPHEDEMA SCREENING   Measurement Type Unilateral    L-DEX MEASUREMENT EXTREMITY Upper Extremity    POSITION  Standing    DOMINANT SIDE Right    At Risk Side Left    BASELINE SCORE (UNILATERAL) -2.1    L-DEX SCORE (UNILATERAL) -3.5    VALUE CHANGE (UNILAT) -1.4             SOZO SCREENING: Patient was assessed today  using the SOZO machine to determine the lymphedema index score. This was compared to her baseline score. It was determined that she is within the recommended range when compared to her baseline and no further action is needed at this time. She will continue SOZO screenings. These are done every 3 months for 2 years post operatively followed by every 6 months for 2 years, and then annually.  Christine [name], [place] 11/25/21 3:54 PM

## 2021-11-27 MED FILL — Dexamethasone Sodium Phosphate Inj 100 MG/10ML: INTRAMUSCULAR | Qty: 1 | Status: AC

## 2021-11-28 ENCOUNTER — Encounter: Payer: Self-pay | Admitting: Hematology and Oncology

## 2021-11-28 ENCOUNTER — Inpatient Hospital Stay: Payer: No Typology Code available for payment source

## 2021-11-28 ENCOUNTER — Inpatient Hospital Stay (HOSPITAL_BASED_OUTPATIENT_CLINIC_OR_DEPARTMENT_OTHER): Payer: No Typology Code available for payment source | Admitting: Adult Health

## 2021-11-28 ENCOUNTER — Encounter: Payer: Self-pay | Admitting: *Deleted

## 2021-11-28 ENCOUNTER — Encounter: Payer: Self-pay | Admitting: Adult Health

## 2021-11-28 VITALS — BP 120/74 | HR 86 | Temp 97.9°F | Resp 16 | Ht 64.0 in | Wt 138.9 lb

## 2021-11-28 DIAGNOSIS — Z170 Estrogen receptor positive status [ER+]: Secondary | ICD-10-CM

## 2021-11-28 DIAGNOSIS — C50412 Malignant neoplasm of upper-outer quadrant of left female breast: Secondary | ICD-10-CM | POA: Diagnosis not present

## 2021-11-28 DIAGNOSIS — Z510 Encounter for antineoplastic radiation therapy: Secondary | ICD-10-CM | POA: Diagnosis not present

## 2021-11-28 DIAGNOSIS — Z95828 Presence of other vascular implants and grafts: Secondary | ICD-10-CM

## 2021-11-28 LAB — CMP (CANCER CENTER ONLY)
ALT: 23 U/L (ref 0–44)
AST: 21 U/L (ref 15–41)
Albumin: 4.1 g/dL (ref 3.5–5.0)
Alkaline Phosphatase: 39 U/L (ref 38–126)
Anion gap: 6 (ref 5–15)
BUN: 15 mg/dL (ref 8–23)
CO2: 26 mmol/L (ref 22–32)
Calcium: 8.8 mg/dL — ABNORMAL LOW (ref 8.9–10.3)
Chloride: 105 mmol/L (ref 98–111)
Creatinine: 0.7 mg/dL (ref 0.44–1.00)
GFR, Estimated: >60 mL/min (ref >60)
Glucose, Bld: 111 mg/dL — ABNORMAL HIGH (ref 70–99)
Potassium: 3.9 mmol/L (ref 3.5–5.1)
Sodium: 137 mmol/L (ref 135–145)
Total Bilirubin: 0.9 mg/dL (ref 0.3–1.2)
Total Protein: 6.4 g/dL — ABNORMAL LOW (ref 6.5–8.1)

## 2021-11-28 LAB — CBC WITH DIFFERENTIAL (CANCER CENTER ONLY)
Abs Immature Granulocytes: 0.07 10*3/uL (ref 0.00–0.07)
Basophils Absolute: 0.1 10*3/uL (ref 0.0–0.1)
Basophils Relative: 1 %
Eosinophils Absolute: 0.4 10*3/uL (ref 0.0–0.5)
Eosinophils Relative: 4 %
HCT: 30.2 % — ABNORMAL LOW (ref 36.0–46.0)
Hemoglobin: 10.5 g/dL — ABNORMAL LOW (ref 12.0–15.0)
Immature Granulocytes: 1 %
Lymphocytes Relative: 16 %
Lymphs Abs: 1.5 10*3/uL (ref 0.7–4.0)
MCH: 31.3 pg (ref 26.0–34.0)
MCHC: 34.8 g/dL (ref 30.0–36.0)
MCV: 90.1 fL (ref 80.0–100.0)
Monocytes Absolute: 0.5 10*3/uL (ref 0.1–1.0)
Monocytes Relative: 6 %
Neutro Abs: 6.6 10*3/uL (ref 1.7–7.7)
Neutrophils Relative %: 72 %
Platelet Count: 361 10*3/uL (ref 150–400)
RBC: 3.35 MIL/uL — ABNORMAL LOW (ref 3.87–5.11)
RDW: 13.6 % (ref 11.5–15.5)
WBC Count: 9.1 10*3/uL (ref 4.0–10.5)
nRBC: 0 % (ref 0.0–0.2)

## 2021-11-28 MED ORDER — SODIUM CHLORIDE 0.9% FLUSH
10.0000 mL | Freq: Once | INTRAVENOUS | Status: AC
  Administered 2021-11-28: 10 mL

## 2021-11-28 MED ORDER — FAMOTIDINE IN NACL 20-0.9 MG/50ML-% IV SOLN
20.0000 mg | Freq: Once | INTRAVENOUS | Status: AC
  Administered 2021-11-28: 20 mg via INTRAVENOUS
  Filled 2021-11-28: qty 50

## 2021-11-28 MED ORDER — HEPARIN SOD (PORK) LOCK FLUSH 100 UNIT/ML IV SOLN
500.0000 [IU] | Freq: Once | INTRAVENOUS | Status: AC | PRN
  Administered 2021-11-28: 500 [IU]

## 2021-11-28 MED ORDER — SODIUM CHLORIDE 0.9 % IV SOLN
10.0000 mg | Freq: Once | INTRAVENOUS | Status: AC
  Administered 2021-11-28: 10 mg via INTRAVENOUS
  Filled 2021-11-28: qty 10

## 2021-11-28 MED ORDER — SODIUM CHLORIDE 0.9% FLUSH
10.0000 mL | INTRAVENOUS | Status: DC | PRN
  Administered 2021-11-28: 10 mL

## 2021-11-28 MED ORDER — CETIRIZINE HCL 10 MG/ML IV SOLN
10.0000 mg | Freq: Once | INTRAVENOUS | Status: AC
  Administered 2021-11-28: 10 mg via INTRAVENOUS
  Filled 2021-11-28: qty 1

## 2021-11-28 MED ORDER — TRASTUZUMAB-DKST CHEMO 150 MG IV SOLR
2.0000 mg/kg | Freq: Once | INTRAVENOUS | Status: AC
  Administered 2021-11-28: 126 mg via INTRAVENOUS
  Filled 2021-11-28: qty 6

## 2021-11-28 MED ORDER — SODIUM CHLORIDE 0.9 % IV SOLN: Freq: Once | INTRAVENOUS | Status: AC

## 2021-11-28 MED ORDER — ACETAMINOPHEN 325 MG PO TABS
650.0000 mg | ORAL_TABLET | Freq: Once | ORAL | Status: AC
  Administered 2021-11-28: 650 mg via ORAL
  Filled 2021-11-28: qty 2

## 2021-11-28 MED ORDER — SODIUM CHLORIDE 0.9 % IV SOLN
65.0000 mg/m2 | Freq: Once | INTRAVENOUS | Status: AC
  Administered 2021-11-28: 108 mg via INTRAVENOUS
  Filled 2021-11-28: qty 18

## 2021-11-28 NOTE — Progress Notes (Signed)
[name]:    Christine Huddle, MD [address] [name] [address]   DIAGNOSIS:  Cancer Staging  Malignant neoplasm of upper-outer quadrant of left breast in female, estrogen receptor positive ([place]) Staging form: Breast, AJCC 8th Edition - Clinical stage from 07/29/2021: Stage IA (cT1c, cN0, cM0, G3, ER+, PR-, HER2+) - Signed by Hayden Pedro, PA-C on 07/31/2021 Stage prefix: Initial diagnosis Method of lymph node assessment: Clinical Histologic grading system: 3 grade system   SUMMARY OF ONCOLOGIC HISTORY: Oncology History  Malignant neoplasm of upper-outer quadrant of left breast in female, estrogen receptor positive ([name])  07/17/2021 Mammogram   Suspicious left breast mass at 3 o'clock, 4 cm from the nipple. No axillary adenopathy. Targeted ultrasound is performed, showing an irregular centrally hypoechoic mass with an echogenic rim measuring 7 x 5 by 7 mm, thought to correlate with the mammographically identified mass. This mass is located at 3 o'clock, 4 cm from the nipple. No axillary adenopathy.   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female, estrogen receptor positive ([place])   07/29/2021 Cancer Staging   Staging form: Breast, AJCC 8th Edition - Clinical stage from 07/29/2021: Stage IA (cT1c, cN0, cM0, G3, ER+, PR-, HER2+) - Signed by Hayden Pedro, PA-C on 07/31/2021 Stage prefix: Initial diagnosis Method of lymph node assessment: Clinical Histologic grading system: 3 grade system    Pathology Results   Path showed IDC, grade 3, ER 100% positive, strong staining intensity, PR negative, ki 67 35%, Her 2 positive   08/05/2021 Genetic Testing   Negative hereditary cancer genetic testing: no pathogenic variants detected in [facility] BRCAPlus Panel and [facility] CustomNext-Cancer +RNAinsight Panel.  Report dates are August 05, 2021 and August 08, 2021.    The BRCAplus panel  offered by [employer] and includes sequencing and deletion/duplication analysis for the following 8 genes: ATM, BRCA1, BRCA2, CDH1, CHEK2, PALB2, PTEN, and TP53. The CustomNext-Cancer+RNAinsight panel offered by [facility] includes sequencing and rearrangement analysis for the following 47 genes:  APC, ATM, AXIN2, BARD1, BMPR1A, BRCA1, BRCA2, BRIP1, CDH1, CDK4, CDKN2A, CHEK2, DICER1, EPCAM, GREM1, HOXB13, MEN1, MLH1, MSH2, MSH3, MSH6, MUTYH, NBN, NF1, NF2, NTHL1, PALB2, PMS2, POLD1, POLE, PTEN, RAD51C, RAD51D, RECQL, RET, SDHA, SDHAF2, SDHB, SDHC, SDHD, SMAD4, SMARCA4, STK11, TP53, TSC1, TSC2, and VHL.  RNA data is routinely analyzed for use in variant interpretation for all genes.   09/12/2021 - 10/03/2021 Chemotherapy   Patient is on Treatment Plan : BREAST Paclitaxel + Trastuzumab q7d / Trastuzumab q21d     09/12/2021 -  Chemotherapy   Patient is on Treatment Plan : BREAST Paclitaxel + Trastuzumab q7d / Trastuzumab q21d       CURRENT THERAPY: Taxol/Herceptin  INTERVAL HISTORY: Christine [name] 61 y.o. female returns for follow-up prior to receiving one of her final weekly doses of Taxol Herceptin.  Her most recent echo occurred on November 08, 2021 and showed a left ventricular ejection fraction of 60 to 65%.  She is feeling well.  She notes that she had very mild neuropathy a couple of cycles ago and her Taxol was dose reduced and since that time it has not increased.  She does have pain under the nailbeds of her fingers and toes and some slight discoloration in the nail.  Otherwise she is doing well without questions or concerns.   Patient Active Problem List   Diagnosis Date Noted   Antineoplastic chemotherapy induced  anemia 10/18/2021   Port-A-Cath in place 09/12/2021   Iron deficiency anemia 09/02/2021   Genetic testing 08/05/2021   Family history of breast cancer 07/31/2021   Atherosclerotic heart disease 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 07/31/2021   Easy  bruising 07/31/2021   Essential hypertension 07/31/2021   Hearing loss in left ear 07/31/2021   Insomnia 07/31/2021   Mixed hyperlipidemia 07/31/2021   Other long term (current) drug therapy 07/31/2021   Sleep disorder 07/31/2021   Tendency toward bleeding easily ([place]) 07/31/2021   Vitamin D deficiency 07/31/2021   Malignant neoplasm of upper-outer quadrant of left breast in female, estrogen receptor positive ([name]) 07/25/2021   Abnormal finding on mammography 07/10/2021   GERD (gastroesophageal reflux disease) 08/31/2019   Globus pharyngeus 08/02/2019   Rhinitis, chronic 08/02/2019   Elevated blood pressure reading 01/26/2019   Acute pain of right knee 10/26/2017    is allergic to lisinopril and mefloquine.  MEDICAL HISTORY: Past Medical History:  Diagnosis Date   Family history of breast cancer 07/31/2021   GERD (gastroesophageal reflux disease)    Hypertension     SURGICAL HISTORY: Past Surgical History:  Procedure Laterality Date   BREAST LUMPECTOMY WITH RADIOACTIVE SEED AND SENTINEL LYMPH NODE BIOPSY Left 08/09/2021   Procedure: LEFT BREAST RADIOACTIVE SEED LOCALIZED LUMPECTOMY AND SENTINEL NODE BIOPSY;  Surgeon: Jovita Kussmaul, MD;  Location: [facility];  Service: General;  Laterality: Left;  GEN & PEC BLOCK   CESAREAN SECTION     FOOT SURGERY Right    PORTACATH PLACEMENT Right 08/09/2021   Procedure: PORT PLACEMENT RIGHT  INTERNAL JUGULAR  WITH ULTRASOUND GUIDANCE;  Surgeon: Jovita Kussmaul, MD;  Location: [facility];  Service: General;  Laterality: Right;  RIGHT INTERNAL JUGULAR PLACEMENT    SOCIAL HISTORY: Social History   Socioeconomic History   Marital status: Married    Spouse name: Not on file   Number of children: Not on file   Years of education: Not on file   Highest education level: Not on file  Occupational History   Not on file  Tobacco Use   Smoking status: Never   Smokeless tobacco: Never  Vaping Use   Vaping Use: Never used  Substance and Sexual Activity    Alcohol use: Yes    Alcohol/week: 2.0 standard drinks of alcohol    Types: 2 Glasses of wine per week   Drug use: Never   Sexual activity: Not on file  Other Topics Concern   Not on file  Social History Narrative   Not on file   Social Determinants of Health   Financial Resource Strain: Not on file  Food Insecurity: Not on file  Transportation Needs: Not on file  Physical Activity: Not on file  Stress: Not on file  Social Connections: Not on file  Intimate Partner Violence: Not on file    FAMILY HISTORY: Family History  Problem Relation Age of Onset   Heart disease Father    Breast cancer Sister 33   Esophageal cancer Maternal Uncle        dx after age 54   Lung cancer Paternal Uncle    Skin cancer Paternal Uncle     Review of Systems  Constitutional:  Negative for appetite change, chills, fatigue, fever and unexpected weight change.  HENT:   Negative for hearing loss, lump/mass and trouble swallowing.   Eyes:  Negative for eye problems and icterus.  Respiratory:  Negative for chest tightness, cough and shortness of breath.  Cardiovascular:  Negative for chest pain, leg swelling and palpitations.  Gastrointestinal:  Negative for abdominal distention, abdominal pain, constipation, diarrhea, nausea and vomiting.  Endocrine: Negative for hot flashes.  Genitourinary:  Negative for difficulty urinating.   Musculoskeletal:  Negative for arthralgias.  Skin:  Negative for itching and rash.  Neurological:  Negative for dizziness, extremity weakness, headaches and numbness.  Hematological:  Negative for adenopathy. Does not bruise/bleed easily.  Psychiatric/Behavioral:  Negative for depression. The patient is not nervous/anxious.       PHYSICAL EXAMINATION  ECOG PERFORMANCE STATUS: 1 - Symptomatic but completely ambulatory  Vitals:   11/28/21 1204  BP: 120/74  Pulse: 86  Resp: 16  Temp: 97.9 F (36.6 C)  SpO2: 100%    Physical Exam Constitutional:       General: She is not in acute distress.    Appearance: Normal appearance. She is not toxic-appearing.  HENT:     Head: Normocephalic and atraumatic.  Eyes:     General: No scleral icterus. Cardiovascular:     Rate and Rhythm: Normal rate and regular rhythm.     Pulses: Normal pulses.     Heart sounds: Normal heart sounds.  Pulmonary:     Effort: Pulmonary effort is normal.     Breath sounds: Normal breath sounds.  Abdominal:     General: Abdomen is flat. Bowel sounds are normal. There is no distension.     Palpations: Abdomen is soft.     Tenderness: There is no abdominal tenderness.  Musculoskeletal:        General: No swelling.     Cervical back: Neck supple.  Lymphadenopathy:     Cervical: No cervical adenopathy.  Skin:    General: Skin is warm and dry.     Findings: No rash.  Neurological:     General: No focal deficit present.     Mental Status: She is alert.  Psychiatric:        Mood and Affect: Mood normal.        Behavior: Behavior normal.     LABORATORY DATA:  CBC    Component Value Date/Time   WBC 9.1 11/28/2021 1151   WBC 6.8 06/16/2019 1456   RBC 3.35 (L) 11/28/2021 1151   HGB 10.5 (L) 11/28/2021 1151   HCT 30.2 (L) 11/28/2021 1151   PLT 361 11/28/2021 1151   MCV 90.1 11/28/2021 1151   MCH 31.3 11/28/2021 1151   MCHC 34.8 11/28/2021 1151   RDW 13.6 11/28/2021 1151   LYMPHSABS 1.5 11/28/2021 1151   MONOABS 0.5 11/28/2021 1151   EOSABS 0.4 11/28/2021 1151   BASOSABS 0.1 11/28/2021 1151    CMP     Component Value Date/Time   NA 137 11/28/2021 1151   NA 142 10/31/2019 1545   K 3.9 11/28/2021 1151   CL 105 11/28/2021 1151   CO2 26 11/28/2021 1151   GLUCOSE 111 (H) 11/28/2021 1151   BUN 15 11/28/2021 1151   BUN 11 10/31/2019 1545   CREATININE 0.70 11/28/2021 1151   CALCIUM 8.8 (L) 11/28/2021 1151   PROT 6.4 (L) 11/28/2021 1151   PROT 6.7 08/01/2020 1004   ALBUMIN 4.1 11/28/2021 1151   ALBUMIN 4.5 08/01/2020 1004   AST 21 11/28/2021 1151    ALT 23 11/28/2021 1151   ALKPHOS 39 11/28/2021 1151   BILITOT 0.9 11/28/2021 1151   GFRNONAA >60 11/28/2021 1151   GFRAA 104 10/31/2019 1545        ASSESSMENT and THERAPY PLAN:  Malignant neoplasm of upper-outer quadrant of left breast in female, estrogen receptor positive ([place]) Ryane is a 61 year old woman with stage Ia triple positive breast cancer status postlumpectomy and currently undergoing adjuvant chemotherapy with Taxol and Herceptin. She is currently undergoing adjuvant Herceptin and Taxol.  She is tolerating treatment moderately well with grade 1 peripheral neuropathy therefore leading to Taxol dose reduction.  She will proceed with treatment today.  We reviewed Herceptin in detail.  She is undergoing CT simulation for her radiation soon and will begin adjuvant radiation in December.  Zamiyah will complete 1 more cycle of chemotherapy and Herceptin after today and the will begin every 3-week Herceptin.  Next week she will have labs and Taxol and Herceptin.  In 2 weeks she will do labs, follow-up, and Herceptin.   All questions were answered. The patient knows to call the clinic with any problems, questions or concerns. We can certainly see the patient much sooner if necessary.  Total encounter time:30 minutes*in face-to-face visit time, chart review, lab review, care coordination, order entry, and documentation of the encounter time.    Wilber Bihari, NP 11/28/21 12:42 PM Medical Oncology and Hematology [facility] [place], [place] [address] Tel. [phone]    Fax. [phone]  *Total Encounter Time as defined by the Centers for Medicare and Medicaid Services includes, in addition to the face-to-face time of a patient visit (documented in the note above) non-face-to-face time: obtaining and reviewing outside history, ordering and reviewing medications, tests or procedures, care coordination (communications with other health care  professionals or caregivers) and documentation in the medical record.

## 2021-11-28 NOTE — Assessment & Plan Note (Signed)
Christine [name] is a 61 year old woman with stage Ia triple positive breast cancer status postlumpectomy and currently undergoing adjuvant chemotherapy with Taxol and Herceptin. She is currently undergoing adjuvant Herceptin and Taxol.  She is tolerating treatment moderately well with grade 1 peripheral neuropathy therefore leading to Taxol dose reduction.  She will proceed with treatment today.  We reviewed Herceptin in detail.  She is undergoing CT simulation for her radiation soon and will begin adjuvant radiation in December.  Areta will complete 1 more cycle of chemotherapy and Herceptin after today and the will begin every 3-week Herceptin.  Next week she will have labs and Taxol and Herceptin.  In 2 weeks she will do labs, follow-up, and Herceptin.

## 2021-12-02 ENCOUNTER — Telehealth: Payer: Self-pay | Admitting: Adult Health

## 2021-12-02 NOTE — Telephone Encounter (Signed)
Scheduled appointment per 10/26 los. Left voicemail.

## 2021-12-04 ENCOUNTER — Encounter: Payer: Self-pay | Admitting: Hematology and Oncology

## 2021-12-04 ENCOUNTER — Other Ambulatory Visit: Payer: Self-pay | Admitting: Internal Medicine

## 2021-12-04 DIAGNOSIS — M810 Age-related osteoporosis without current pathological fracture: Secondary | ICD-10-CM

## 2021-12-04 MED FILL — Dexamethasone Sodium Phosphate Inj 100 MG/10ML: INTRAMUSCULAR | Qty: 1 | Status: AC

## 2021-12-05 ENCOUNTER — Encounter: Payer: Self-pay | Admitting: *Deleted

## 2021-12-05 ENCOUNTER — Inpatient Hospital Stay: Payer: No Typology Code available for payment source

## 2021-12-05 ENCOUNTER — Inpatient Hospital Stay: Payer: No Typology Code available for payment source | Attending: Hematology and Oncology

## 2021-12-05 VITALS — BP 126/63 | HR 66 | Temp 98.0°F | Resp 16 | Wt 141.0 lb

## 2021-12-05 DIAGNOSIS — Z5111 Encounter for antineoplastic chemotherapy: Secondary | ICD-10-CM | POA: Diagnosis present

## 2021-12-05 DIAGNOSIS — D6481 Anemia due to antineoplastic chemotherapy: Secondary | ICD-10-CM | POA: Insufficient documentation

## 2021-12-05 DIAGNOSIS — I251 Atherosclerotic heart disease 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: Secondary | ICD-10-CM | POA: Diagnosis not present

## 2021-12-05 DIAGNOSIS — E559 Vitamin D deficiency, unspecified: Secondary | ICD-10-CM | POA: Diagnosis not present

## 2021-12-05 DIAGNOSIS — G620 Drug-induced polyneuropathy: Secondary | ICD-10-CM | POA: Diagnosis not present

## 2021-12-05 DIAGNOSIS — Z801 Family history of malignant neoplasm of trachea, bronchus and lung: Secondary | ICD-10-CM | POA: Diagnosis not present

## 2021-12-05 DIAGNOSIS — Z800 Family history of malignant neoplasm of digestive organs: Secondary | ICD-10-CM | POA: Insufficient documentation

## 2021-12-05 DIAGNOSIS — Z803 Family history of malignant neoplasm of breast: Secondary | ICD-10-CM | POA: Insufficient documentation

## 2021-12-05 DIAGNOSIS — K219 Gastro-esophageal reflux disease without esophagitis: Secondary | ICD-10-CM | POA: Diagnosis not present

## 2021-12-05 DIAGNOSIS — E782 Mixed hyperlipidemia: Secondary | ICD-10-CM | POA: Insufficient documentation

## 2021-12-05 DIAGNOSIS — C50412 Malignant neoplasm of upper-outer quadrant of left female breast: Secondary | ICD-10-CM | POA: Insufficient documentation

## 2021-12-05 DIAGNOSIS — G4700 Insomnia, unspecified: Secondary | ICD-10-CM | POA: Diagnosis not present

## 2021-12-05 DIAGNOSIS — Z923 Personal history of irradiation: Secondary | ICD-10-CM | POA: Insufficient documentation

## 2021-12-05 DIAGNOSIS — I10 Essential (primary) hypertension: Secondary | ICD-10-CM | POA: Insufficient documentation

## 2021-12-05 DIAGNOSIS — Z170 Estrogen receptor positive status [ER+]: Secondary | ICD-10-CM

## 2021-12-05 DIAGNOSIS — Z9221 Personal history of antineoplastic chemotherapy: Secondary | ICD-10-CM | POA: Diagnosis not present

## 2021-12-05 DIAGNOSIS — Z95828 Presence of other vascular implants and grafts: Secondary | ICD-10-CM

## 2021-12-05 LAB — CBC WITH DIFFERENTIAL (CANCER CENTER ONLY)
Abs Immature Granulocytes: 0.02 10*3/uL (ref 0.00–0.07)
Basophils Absolute: 0 10*3/uL (ref 0.0–0.1)
Basophils Relative: 1 %
Eosinophils Absolute: 0.2 10*3/uL (ref 0.0–0.5)
Eosinophils Relative: 4 %
HCT: 27.9 % — ABNORMAL LOW (ref 36.0–46.0)
Hemoglobin: 9.8 g/dL — ABNORMAL LOW (ref 12.0–15.0)
Immature Granulocytes: 0 %
Lymphocytes Relative: 27 %
Lymphs Abs: 1.3 10*3/uL (ref 0.7–4.0)
MCH: 31.9 pg (ref 26.0–34.0)
MCHC: 35.1 g/dL (ref 30.0–36.0)
MCV: 90.9 fL (ref 80.0–100.0)
Monocytes Absolute: 0.3 10*3/uL (ref 0.1–1.0)
Monocytes Relative: 5 %
Neutro Abs: 3.2 10*3/uL (ref 1.7–7.7)
Neutrophils Relative %: 63 %
Platelet Count: 285 10*3/uL (ref 150–400)
RBC: 3.07 MIL/uL — ABNORMAL LOW (ref 3.87–5.11)
RDW: 14.5 % (ref 11.5–15.5)
WBC Count: 5 10*3/uL (ref 4.0–10.5)
nRBC: 0 % (ref 0.0–0.2)

## 2021-12-05 LAB — CMP (CANCER CENTER ONLY)
ALT: 29 U/L (ref 0–44)
AST: 31 U/L (ref 15–41)
Albumin: 4 g/dL (ref 3.5–5.0)
Alkaline Phosphatase: 34 U/L — ABNORMAL LOW (ref 38–126)
Anion gap: 4 — ABNORMAL LOW (ref 5–15)
BUN: 14 mg/dL (ref 8–23)
CO2: 28 mmol/L (ref 22–32)
Calcium: 8.8 mg/dL — ABNORMAL LOW (ref 8.9–10.3)
Chloride: 106 mmol/L (ref 98–111)
Creatinine: 0.8 mg/dL (ref 0.44–1.00)
GFR, Estimated: >60 mL/min (ref >60)
Glucose, Bld: 117 mg/dL — ABNORMAL HIGH (ref 70–99)
Potassium: 3.6 mmol/L (ref 3.5–5.1)
Sodium: 138 mmol/L (ref 135–145)
Total Bilirubin: 1 mg/dL (ref 0.3–1.2)
Total Protein: 6.3 g/dL — ABNORMAL LOW (ref 6.5–8.1)

## 2021-12-05 MED ORDER — TRASTUZUMAB-DKST CHEMO 150 MG IV SOLR
2.0000 mg/kg | Freq: Once | INTRAVENOUS | Status: AC
  Administered 2021-12-05: 126 mg via INTRAVENOUS
  Filled 2021-12-05: qty 6

## 2021-12-05 MED ORDER — HEPARIN SOD (PORK) LOCK FLUSH 100 UNIT/ML IV SOLN
500.0000 [IU] | Freq: Once | INTRAVENOUS | Status: AC | PRN
  Administered 2021-12-05: 500 [IU]

## 2021-12-05 MED ORDER — SODIUM CHLORIDE 0.9% FLUSH
10.0000 mL | INTRAVENOUS | Status: DC | PRN
  Administered 2021-12-05: 10 mL

## 2021-12-05 MED ORDER — SODIUM CHLORIDE 0.9 % IV SOLN
10.0000 mg | Freq: Once | INTRAVENOUS | Status: AC
  Administered 2021-12-05: 10 mg via INTRAVENOUS
  Filled 2021-12-05: qty 10

## 2021-12-05 MED ORDER — SODIUM CHLORIDE 0.9 % IV SOLN
50.0000 mg/m2 | Freq: Once | INTRAVENOUS | Status: AC
  Administered 2021-12-05: 84 mg via INTRAVENOUS
  Filled 2021-12-05: qty 14

## 2021-12-05 MED ORDER — ACETAMINOPHEN 325 MG PO TABS
650.0000 mg | ORAL_TABLET | Freq: Once | ORAL | Status: AC
  Administered 2021-12-05: 650 mg via ORAL
  Filled 2021-12-05: qty 2

## 2021-12-05 MED ORDER — SODIUM CHLORIDE 0.9 % IV SOLN: Freq: Once | INTRAVENOUS | Status: AC

## 2021-12-05 MED ORDER — CETIRIZINE HCL 10 MG/ML IV SOLN
10.0000 mg | Freq: Once | INTRAVENOUS | Status: AC
  Administered 2021-12-05: 10 mg via INTRAVENOUS
  Filled 2021-12-05: qty 1

## 2021-12-05 MED ORDER — SODIUM CHLORIDE 0.9% FLUSH
10.0000 mL | Freq: Once | INTRAVENOUS | Status: AC
  Administered 2021-12-05: 10 mL

## 2021-12-05 MED ORDER — TRASTUZUMAB-DKST CHEMO 150 MG IV SOLR: 6.0000 mg/kg | Freq: Once | INTRAVENOUS | Status: DC

## 2021-12-05 MED ORDER — FAMOTIDINE IN NACL 20-0.9 MG/50ML-% IV SOLN
20.0000 mg | Freq: Once | INTRAVENOUS | Status: AC
  Administered 2021-12-05: 20 mg via INTRAVENOUS
  Filled 2021-12-05: qty 50

## 2021-12-05 NOTE — Progress Notes (Signed)
Per MD Iruku today's dose should be trastuzumab 2 mg/kg then q3 week dosing starting on 11/9.  Larene Beach, PharmD

## 2021-12-05 NOTE — Patient Instructions (Signed)
[facility]  Discharge Instructions: Thank you for choosing [facility] to provide your oncology and hematology care.   If you have a lab appointment with the [facility], please go directly to the [facility] and check in at the registration area.   Wear comfortable clothing and clothing appropriate for easy access to any Portacath or PICC line.   We strive to give you quality time with your provider. You may need to reschedule your appointment if you arrive late (15 or more minutes).  Arriving late affects you and other patients whose appointments are after yours.  Also, if you miss three or more appointments without notifying the office, you may be dismissed from the clinic at the provider's discretion.      For prescription refill requests, have your pharmacy contact our office and allow 72 hours for refills to be completed.    Today you received the following chemotherapy and/or immunotherapy agents: trastuzumab-dkst, paclitaxel      To help prevent nausea and vomiting after your treatment, we encourage you to take your nausea medication as directed.  BELOW ARE SYMPTOMS THAT SHOULD BE REPORTED IMMEDIATELY: *FEVER GREATER THAN 100.4 F (38 C) OR HIGHER *CHILLS OR SWEATING *NAUSEA AND VOMITING THAT IS NOT CONTROLLED WITH YOUR NAUSEA MEDICATION *UNUSUAL SHORTNESS OF BREATH *UNUSUAL BRUISING OR BLEEDING *URINARY PROBLEMS (pain or burning when urinating, or frequent urination) *BOWEL PROBLEMS (unusual diarrhea, constipation, pain near the anus) TENDERNESS IN MOUTH AND THROAT WITH OR WITHOUT PRESENCE OF ULCERS (sore throat, sores in mouth, or a toothache) UNUSUAL RASH, SWELLING OR PAIN  UNUSUAL VAGINAL DISCHARGE OR ITCHING   Items with * indicate a potential emergency and should be followed up as soon as possible or go to the Emergency Department if any problems should occur.  Please show the CHEMOTHERAPY ALERT CARD or IMMUNOTHERAPY ALERT  CARD at check-in to the Emergency Department and triage nurse.  Should you have questions after your visit or need to cancel or reschedule your appointment, please contact [facility]  Dept: [phone]  and follow the prompts.  Office hours are 8:00 a.m. to 4:30 p.m. Monday - Friday. Please note that voicemails left after 4:00 p.m. may not be returned until the following business day.  We are closed weekends and major holidays. You have access to a nurse at all times for urgent questions. Please call the main number to the clinic Dept: [phone] and follow the prompts.   For any non-urgent questions, you may also contact your provider using MyChart. We now offer e-Visits for anyone 71 and older to request care online for non-urgent symptoms. For details visit [email or web address].   Also download the MyChart app! Go to the app store, search "MyChart", open the app, select [facility], and log in with your MyChart username and password.  Masks are optional in the cancer centers. If you would like for your care team to wear a mask while they are taking care of you, please let them know. You may have one support person who is at least 61 years old accompany you for your appointments.

## 2021-12-06 ENCOUNTER — Encounter: Payer: Self-pay | Admitting: Hematology and Oncology

## 2021-12-07 ENCOUNTER — Other Ambulatory Visit: Payer: Self-pay

## 2021-12-09 NOTE — Telephone Encounter (Signed)
This RN spoke with pt per her future treatment for herceptin alone- pt would like to decrease the benadryl dose - and if possible not take it. This RN informed her usually since she has tolerated the herceptin well - we can half the dose of benadryl - then if tolerated she could either do a 4th of dose or none.  Per discussion with pharmacy above is appropriate but MD would need to adjust the orders.  This note will be forwarded to MD per above request.

## 2021-12-11 ENCOUNTER — Encounter: Payer: Self-pay | Admitting: Hematology and Oncology

## 2021-12-11 NOTE — Telephone Encounter (Signed)
No entry 

## 2021-12-12 ENCOUNTER — Ambulatory Visit: Payer: No Typology Code available for payment source

## 2021-12-12 ENCOUNTER — Encounter: Payer: Self-pay | Admitting: Adult Health

## 2021-12-12 ENCOUNTER — Other Ambulatory Visit: Payer: No Typology Code available for payment source

## 2021-12-12 ENCOUNTER — Ambulatory Visit: Payer: No Typology Code available for payment source | Admitting: Adult Health

## 2021-12-12 ENCOUNTER — Inpatient Hospital Stay (HOSPITAL_BASED_OUTPATIENT_CLINIC_OR_DEPARTMENT_OTHER): Payer: No Typology Code available for payment source | Admitting: Adult Health

## 2021-12-12 ENCOUNTER — Inpatient Hospital Stay: Payer: No Typology Code available for payment source

## 2021-12-12 DIAGNOSIS — Z5111 Encounter for antineoplastic chemotherapy: Secondary | ICD-10-CM | POA: Diagnosis not present

## 2021-12-12 DIAGNOSIS — Z170 Estrogen receptor positive status [ER+]: Secondary | ICD-10-CM | POA: Diagnosis not present

## 2021-12-12 DIAGNOSIS — C50412 Malignant neoplasm of upper-outer quadrant of left female breast: Secondary | ICD-10-CM

## 2021-12-12 DIAGNOSIS — Z95828 Presence of other vascular implants and grafts: Secondary | ICD-10-CM

## 2021-12-12 LAB — CBC WITH DIFFERENTIAL (CANCER CENTER ONLY)
Abs Immature Granulocytes: 0.04 10*3/uL (ref 0.00–0.07)
Basophils Absolute: 0.1 10*3/uL (ref 0.0–0.1)
Basophils Relative: 1 %
Eosinophils Absolute: 0.3 10*3/uL (ref 0.0–0.5)
Eosinophils Relative: 4 %
HCT: 29 % — ABNORMAL LOW (ref 36.0–46.0)
Hemoglobin: 9.9 g/dL — ABNORMAL LOW (ref 12.0–15.0)
Immature Granulocytes: 1 %
Lymphocytes Relative: 20 %
Lymphs Abs: 1.5 10*3/uL (ref 0.7–4.0)
MCH: 31.7 pg (ref 26.0–34.0)
MCHC: 34.1 g/dL (ref 30.0–36.0)
MCV: 92.9 fL (ref 80.0–100.0)
Monocytes Absolute: 0.9 10*3/uL (ref 0.1–1.0)
Monocytes Relative: 12 %
Neutro Abs: 4.8 10*3/uL (ref 1.7–7.7)
Neutrophils Relative %: 62 %
Platelet Count: 291 10*3/uL (ref 150–400)
RBC: 3.12 MIL/uL — ABNORMAL LOW (ref 3.87–5.11)
RDW: 14.7 % (ref 11.5–15.5)
WBC Count: 7.7 10*3/uL (ref 4.0–10.5)
nRBC: 0 % (ref 0.0–0.2)

## 2021-12-12 LAB — CMP (CANCER CENTER ONLY)
ALT: 22 U/L (ref 0–44)
AST: 23 U/L (ref 15–41)
Albumin: 4.1 g/dL (ref 3.5–5.0)
Alkaline Phosphatase: 40 U/L (ref 38–126)
Anion gap: 5 (ref 5–15)
BUN: 10 mg/dL (ref 8–23)
CO2: 26 mmol/L (ref 22–32)
Calcium: 8.8 mg/dL — ABNORMAL LOW (ref 8.9–10.3)
Chloride: 104 mmol/L (ref 98–111)
Creatinine: 0.64 mg/dL (ref 0.44–1.00)
GFR, Estimated: >60 mL/min (ref >60)
Glucose, Bld: 116 mg/dL — ABNORMAL HIGH (ref 70–99)
Potassium: 3.8 mmol/L (ref 3.5–5.1)
Sodium: 135 mmol/L (ref 135–145)
Total Bilirubin: 1.1 mg/dL (ref 0.3–1.2)
Total Protein: 6.4 g/dL — ABNORMAL LOW (ref 6.5–8.1)

## 2021-12-12 MED ORDER — HEPARIN SOD (PORK) LOCK FLUSH 100 UNIT/ML IV SOLN
500.0000 [IU] | Freq: Once | INTRAVENOUS | Status: AC | PRN
  Administered 2021-12-12: 500 [IU]

## 2021-12-12 MED ORDER — SODIUM CHLORIDE 0.9% FLUSH
10.0000 mL | INTRAVENOUS | Status: DC | PRN
  Administered 2021-12-12: 10 mL

## 2021-12-12 MED ORDER — SODIUM CHLORIDE 0.9 % IV SOLN: Freq: Once | INTRAVENOUS | Status: AC

## 2021-12-12 MED ORDER — DIPHENHYDRAMINE HCL 25 MG PO CAPS
50.0000 mg | ORAL_CAPSULE | Freq: Once | ORAL | Status: AC
  Administered 2021-12-12: 50 mg via ORAL
  Filled 2021-12-12: qty 2

## 2021-12-12 MED ORDER — LIDOCAINE-PRILOCAINE 2.5-2.5 % EX CREA: TOPICAL_CREAM | CUTANEOUS | 3 refills | Status: DC

## 2021-12-12 MED ORDER — ACETAMINOPHEN 325 MG PO TABS
650.0000 mg | ORAL_TABLET | Freq: Once | ORAL | Status: AC
  Administered 2021-12-12: 650 mg via ORAL
  Filled 2021-12-12: qty 2

## 2021-12-12 MED ORDER — TRASTUZUMAB-DKST CHEMO 150 MG IV SOLR
6.0000 mg/kg | Freq: Once | INTRAVENOUS | Status: AC
  Administered 2021-12-12: 378 mg via INTRAVENOUS
  Filled 2021-12-12: qty 18

## 2021-12-12 MED ORDER — SODIUM CHLORIDE 0.9% FLUSH
10.0000 mL | Freq: Once | INTRAVENOUS | Status: AC
  Administered 2021-12-12: 10 mL

## 2021-12-12 NOTE — Patient Instructions (Signed)
[facility]  Discharge Instructions: Thank you for choosing [facility] to provide your oncology and hematology care.   If you have a lab appointment with the [facility], please go directly to the [facility] and check in at the registration area.   Wear comfortable clothing and clothing appropriate for easy access to any Portacath or PICC line.   We strive to give you quality time with your provider. You may need to reschedule your appointment if you arrive late (15 or more minutes).  Arriving late affects you and other patients whose appointments are after yours.  Also, if you miss three or more appointments without notifying the office, you may be dismissed from the clinic at the provider's discretion.      For prescription refill requests, have your pharmacy contact our office and allow 72 hours for refills to be completed.    Today you received the following chemotherapy and/or immunotherapy agents: trastuzumab-dkst       To help prevent nausea and vomiting after your treatment, we encourage you to take your nausea medication as directed.  BELOW ARE SYMPTOMS THAT SHOULD BE REPORTED IMMEDIATELY: *FEVER GREATER THAN 100.4 F (38 C) OR HIGHER *CHILLS OR SWEATING *NAUSEA AND VOMITING THAT IS NOT CONTROLLED WITH YOUR NAUSEA MEDICATION *UNUSUAL SHORTNESS OF BREATH *UNUSUAL BRUISING OR BLEEDING *URINARY PROBLEMS (pain or burning when urinating, or frequent urination) *BOWEL PROBLEMS (unusual diarrhea, constipation, pain near the anus) TENDERNESS IN MOUTH AND THROAT WITH OR WITHOUT PRESENCE OF ULCERS (sore throat, sores in mouth, or a toothache) UNUSUAL RASH, SWELLING OR PAIN  UNUSUAL VAGINAL DISCHARGE OR ITCHING   Items with * indicate a potential emergency and should be followed up as soon as possible or go to the Emergency Department if any problems should occur.  Please show the CHEMOTHERAPY ALERT CARD or IMMUNOTHERAPY ALERT CARD at  check-in to the Emergency Department and triage nurse.  Should you have questions after your visit or need to cancel or reschedule your appointment, please contact [facility]  Dept: [phone]  and follow the prompts.  Office hours are 8:00 a.m. to 4:30 p.m. Monday - Friday. Please note that voicemails left after 4:00 p.m. may not be returned until the following business day.  We are closed weekends and major holidays. You have access to a nurse at all times for urgent questions. Please call the main number to the clinic Dept: [phone] and follow the prompts.   For any non-urgent questions, you may also contact your provider using MyChart. We now offer e-Visits for anyone 71 and older to request care online for non-urgent symptoms. For details visit [email or web address].   Also download the MyChart app! Go to the app store, search "MyChart", open the app, select [facility], and log in with your MyChart username and password.  Masks are optional in the cancer centers. If you would like for your care team to wear a mask while they are taking care of you, please let them know. You may have one support person who is at least 61 years old accompany you for your appointments.

## 2021-12-12 NOTE — Assessment & Plan Note (Signed)
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  Her most recent echocardiogram was completed last month and is stable.  She has no signs of cardiotoxicity and she will continue to undergo echocardiograms every 3 months.  Her labs remain stable she has a mild anemia which should slowly improve as this is likely secondary to the chemotherapy she received.  We reviewed that she will continue on the Herceptin every 3 weeks to complete 1 year of Herceptin therapy.  We also discussed that she will continue with radiation which is set to begin around November 28.  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  It is not particularly troublesome for her so for now we will continue to monitor it.  I discontinued her antinausea medicine, oxycodone, Prilosec and then I refilled her Emla cream today.  She will return in 3 weeks for labs, follow-up, and her next Herceptin.

## 2021-12-12 NOTE — Progress Notes (Signed)
[facility] Follow up:    [name], MD [address] [place] [address]   DIAGNOSIS:  Cancer Staging  Malignant neoplasm of upper-outer quadrant of left breast in female, estrogen receptor positive ([place]) Staging form: Breast, AJCC 8th Edition - Clinical stage from 07/29/2021: Stage IA (cT1c, cN0, cM0, G3, ER+, PR-, HER2+) - Signed by Hayden Pedro, PA-C on 07/31/2021 Stage prefix: Initial diagnosis Method of lymph node assessment: Clinical Histologic grading system: 3 grade system   SUMMARY OF ONCOLOGIC HISTORY: Oncology History  Malignant neoplasm of upper-outer quadrant of left breast in female, estrogen receptor positive ([place])  07/17/2021 Mammogram   Suspicious left breast mass at 3 o'clock, 4 cm from the nipple. No axillary adenopathy. Targeted ultrasound is performed, showing an irregular centrally hypoechoic mass with an echogenic rim measuring 7 x 5 by 7 mm, thought to correlate with the mammographically identified mass. This mass is located at 3 o'clock, 4 cm from the nipple. No axillary adenopathy.   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female, estrogen receptor positive ([place])   07/29/2021 Cancer Staging   Staging form: Breast, AJCC 8th Edition - Clinical stage from 07/29/2021: Stage IA (cT1c, cN0, cM0, G3, ER+, PR-, HER2+) - Signed by Hayden Pedro, PA-C on 07/31/2021 Stage prefix: Initial diagnosis Method of lymph node assessment: Clinical Histologic grading system: 3 grade system    Pathology Results   Path showed IDC, grade 3, ER 100% positive, strong staining intensity, PR negative, ki 67 35%, Her 2 positive   08/05/2021 Genetic Testing   Negative hereditary cancer genetic testing: no pathogenic variants detected in [facility] BRCAPlus Panel and [facility] CustomNext-Cancer +RNAinsight Panel.  Report dates are August 05, 2021 and August 08, 2021.    The BRCAplus panel  offered by [employer] and includes sequencing and deletion/duplication analysis for the following 8 genes: ATM, BRCA1, BRCA2, CDH1, CHEK2, PALB2, PTEN, and TP53. The CustomNext-Cancer+RNAinsight panel offered by [facility] includes sequencing and rearrangement analysis for the following 47 genes:  APC, ATM, AXIN2, BARD1, BMPR1A, BRCA1, BRCA2, BRIP1, CDH1, CDK4, CDKN2A, CHEK2, DICER1, EPCAM, GREM1, HOXB13, MEN1, MLH1, MSH2, MSH3, MSH6, MUTYH, NBN, NF1, NF2, NTHL1, PALB2, PMS2, POLD1, POLE, PTEN, RAD51C, RAD51D, RECQL, RET, SDHA, SDHAF2, SDHB, SDHC, SDHD, SMAD4, SMARCA4, STK11, TP53, TSC1, TSC2, and VHL.  RNA data is routinely analyzed for use in variant interpretation for all genes.   08/09/2021 Surgery   Left lumpectomy: IDC, grade 3, 3 sentinel lymph nodes all negative for cancer.  He has biomarker testing ER positive, PR negative, HER2 positive.  pT1c, PN0.   09/12/2021 -  Adjuvant Chemotherapy   Taxol/Herceptin weekly x12, followed by Herceptin given every 3 weeks to complete 1 year of treatment.     CURRENT THERAPY: Maintenance Herceptin, to start adjuvant radiation  INTERVAL HISTORY: Christine [name] SAEZ 61 y.o. female returns for follow-up and evaluation prior to receiving Herceptin therapy.  She has completed her initial chemotherapy with Taxol and Herceptin.  She is scheduled for CT simulation for her adjuvant radiation on December 31, 2021.  Most recent echocardiogram occurred on November 08, 2021 and showed a left ventricular ejection fraction of 60 to 65%.  Christine [name] tells me that she is feeling well today.  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  She is getting over a cold and so her voice is a little bit scratchy today.  She is  requesting a refill on her Emla cream and that we discontinue her nausea medicine and oxycodone as she no longer needs them.  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 07/31/2021   Easy bruising 07/31/2021   Essential hypertension 07/31/2021   Hearing loss in left ear 07/31/2021   Insomnia 07/31/2021   Mixed hyperlipidemia 07/31/2021   Other long term (current) drug therapy 07/31/2021   Sleep disorder 07/31/2021   Tendency toward bleeding easily ([place]) 07/31/2021   Vitamin D deficiency 07/31/2021   Malignant neoplasm of upper-outer quadrant of left breast in female, estrogen receptor positive ([place]) 07/25/2021   Abnormal finding on mammography 07/10/2021   GERD (gastroesophageal reflux disease) 08/31/2019   Globus pharyngeus 08/02/2019   Rhinitis, chronic 08/02/2019   Elevated blood pressure reading 01/26/2019   Acute pain of right knee 10/26/2017    is allergic to lisinopril and mefloquine.  MEDICAL HISTORY: Past Medical History:  Diagnosis Date   Family history of breast cancer 07/31/2021   GERD (gastroesophageal reflux disease)    Hypertension     SURGICAL HISTORY: Past Surgical History:  Procedure Laterality Date   BREAST LUMPECTOMY WITH RADIOACTIVE SEED AND SENTINEL LYMPH NODE BIOPSY Left 08/09/2021   Procedure: LEFT BREAST RADIOACTIVE SEED LOCALIZED LUMPECTOMY AND SENTINEL NODE BIOPSY;  Surgeon: Jovita Kussmaul, MD;  Location: [facility];  Service: General;  Laterality: Left;  GEN & PEC BLOCK   CESAREAN SECTION     FOOT SURGERY Right    PORTACATH PLACEMENT Right 08/09/2021   Procedure: PORT PLACEMENT RIGHT  INTERNAL JUGULAR  WITH ULTRASOUND GUIDANCE;  Surgeon: Jovita Kussmaul, MD;  Location: [facility];  Service: General;  Laterality: Right;  RIGHT INTERNAL JUGULAR PLACEMENT    SOCIAL HISTORY: Social History   Socioeconomic History   Marital status: Married    Spouse name: Not on  file   Number of children: Not on file   Years of education: Not on file   Highest education level: Not on file  Occupational History   Not on file  Tobacco Use   Smoking status: Never   Smokeless tobacco: Never  Vaping Use   Vaping Use: Never used  Substance and Sexual Activity   Alcohol use: Yes    Alcohol/week: 2.0 standard drinks of alcohol    Types: 2 Glasses of wine per week   Drug use: Never   Sexual activity: Not on file  Other Topics Concern   Not on file  Social History Narrative   Not on file   Social Determinants of Health   Financial Resource Strain: Not on file  Food Insecurity: Not on file  Transportation Needs: Not on file  Physical Activity: Not on file  Stress: Not on file  Social Connections: Not on file  Intimate Partner Violence: Not on file    FAMILY HISTORY: Family History  Problem Relation Age of Onset   Heart disease Father    Breast cancer Sister 64   Esophageal cancer Maternal Uncle        dx after age 67   Lung cancer Paternal Uncle    Skin cancer Paternal Uncle     Review of Systems  Constitutional:  Positive for fatigue (mild). Negative for appetite change, chills,  fever and unexpected weight change.  HENT:   Negative for hearing loss, lump/mass and trouble swallowing.   Eyes:  Negative for eye problems and icterus.  Respiratory:  Negative for chest tightness, cough and shortness of breath.   Cardiovascular:  Negative for chest pain, leg swelling and palpitations.  Gastrointestinal:  Negative for abdominal distention, abdominal pain, constipation, diarrhea, nausea and vomiting.  Endocrine: Negative for hot flashes.  Genitourinary:  Negative for difficulty urinating.   Musculoskeletal:  Negative for arthralgias.  Skin:  Negative for itching and rash.  Neurological:  Positive for numbness. Negative for dizziness, extremity weakness and headaches.  Hematological:  Negative for adenopathy. Does not bruise/bleed easily.   Psychiatric/Behavioral:  Negative for depression. The patient is not nervous/anxious.       PHYSICAL EXAMINATION  ECOG PERFORMANCE STATUS: 1 - Symptomatic but completely ambulatory  Vitals:   12/12/21 0912  BP: (!) 118/59  Pulse: 78  Resp: 16  Temp: 97.9 F (36.6 C)  SpO2: 100%    Physical Exam Constitutional:      General: She is not in acute distress.    Appearance: Normal appearance. She is not toxic-appearing.  HENT:     Head: Normocephalic and atraumatic.  Eyes:     General: No scleral icterus. Cardiovascular:     Rate and Rhythm: Normal rate and regular rhythm.     Pulses: Normal pulses.     Heart sounds: Normal heart sounds.  Pulmonary:     Effort: Pulmonary effort is normal.     Breath sounds: Normal breath sounds.  Abdominal:     General: Abdomen is flat. Bowel sounds are normal. There is no distension.     Palpations: Abdomen is soft.     Tenderness: There is no abdominal tenderness.  Musculoskeletal:        General: No swelling.     Cervical back: Neck supple.  Lymphadenopathy:     Cervical: No cervical adenopathy.  Skin:    General: Skin is warm and dry.     Findings: No rash.  Neurological:     General: No focal deficit present.     Mental Status: She is alert.  Psychiatric:        Mood and Affect: Mood normal.        Behavior: Behavior normal.     LABORATORY DATA:  CBC    Component Value Date/Time   WBC 7.7 12/12/2021 0859   WBC 6.8 06/16/2019 1456   RBC 3.12 (L) 12/12/2021 0859   HGB 9.9 (L) 12/12/2021 0859   HCT 29.0 (L) 12/12/2021 0859   PLT 291 12/12/2021 0859   MCV 92.9 12/12/2021 0859   MCH 31.7 12/12/2021 0859   MCHC 34.1 12/12/2021 0859   RDW 14.7 12/12/2021 0859   LYMPHSABS 1.5 12/12/2021 0859   MONOABS 0.9 12/12/2021 0859   EOSABS 0.3 12/12/2021 0859   BASOSABS 0.1 12/12/2021 0859    CMP     Component Value Date/Time   NA 138 12/05/2021 1328   NA 142 10/31/2019 1545   K 3.6 12/05/2021 1328   CL 106 12/05/2021  1328   CO2 28 12/05/2021 1328   GLUCOSE 117 (H) 12/05/2021 1328   BUN 14 12/05/2021 1328   BUN 11 10/31/2019 1545   CREATININE 0.80 12/05/2021 1328   CALCIUM 8.8 (L) 12/05/2021 1328   PROT 6.3 (L) 12/05/2021 1328   PROT 6.7 08/01/2020 1004   ALBUMIN 4.0 12/05/2021 1328   ALBUMIN 4.5 08/01/2020 1004   AST 31  12/05/2021 1328   ALT 29 12/05/2021 1328   ALKPHOS 34 (L) 12/05/2021 1328   BILITOT 1.0 12/05/2021 1328   GFRNONAA >60 12/05/2021 1328   GFRAA 104 10/31/2019 1545     ASSESSMENT and THERAPY PLAN:   Malignant neoplasm of upper-outer quadrant of left breast in female, estrogen receptor positive ([facility]) Rylan is a 61 year old woman with history of 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  Her most recent echocardiogram was completed last month and is stable.  She has no signs of cardiotoxicity and she will continue to undergo echocardiograms every 3 months.  Her labs remain stable she has a mild anemia which should slowly improve as this is likely secondary to the chemotherapy she received.  We reviewed that she will continue on the Herceptin every 3 weeks to complete 1 year of Herceptin therapy.  We also discussed that she will continue with radiation which is set to begin around November 28.  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  It is not particularly troublesome for her so for now we will continue to monitor it.  I discontinued her antinausea medicine, oxycodone, Prilosec and then I refilled her Emla cream today.  She will return in 3 weeks for labs, follow-up, and her next Herceptin.  All questions were answered. The patient knows to call the clinic with any problems, questions or concerns. We can certainly see the patient much sooner if necessary.  Total encounter  time: 35mnutes*in face-to-face visit time, chart review, lab review, care coordination, order entry, and documentation of the encounter time.    LWilber Bihari NP 12/12/21 9:27 AM [facility] [address]Tel. [phone]   Fax. [phone] *Total Encounter Time as defined by the Centers for Medicare and Medicaid Services includes, in addition to the face-to-face time of a patient visit (documented in the note above) non-face-to-face time: obtaining and reviewing outside history, ordering and reviewing medications, tests or procedures, care coordination (communications with other health care professionals or caregivers) and documentation in the medical record.

## 2021-12-19 ENCOUNTER — Encounter: Payer: Self-pay | Admitting: Physical Therapy

## 2021-12-30 ENCOUNTER — Ambulatory Visit: Payer: No Typology Code available for payment source | Attending: General Surgery

## 2021-12-30 VITALS — Wt 139.2 lb

## 2021-12-30 DIAGNOSIS — Z483 Aftercare following surgery for neoplasm: Secondary | ICD-10-CM | POA: Insufficient documentation

## 2021-12-30 NOTE — Therapy (Signed)
OUTPATIENT PHYSICAL THERAPY [facility] SCREENING NOTE   Patient Name: Christine [name] MRN: [id number] DOB:Oct 03, 1960, 61 y.o., female Today's Date: 12/30/2021  PCP: [name], MD REFERRING PROVIDER: Jovita Kussmaul, MD   PT End of Session - 12/30/21 1649     Visit Number 3   # unchanged due to screen only   PT Start Time 1648    PT Stop Time 1650    PT Time Calculation (min) 2 min    Activity Tolerance Patient tolerated treatment well    Behavior During Therapy [facility] for tasks assessed/performed             Past Medical History:  Diagnosis Date   Family history of breast cancer 07/31/2021   GERD (gastroesophageal reflux disease)    Hypertension    Past Surgical History:  Procedure Laterality Date   BREAST LUMPECTOMY WITH RADIOACTIVE SEED AND SENTINEL LYMPH NODE BIOPSY Left 08/09/2021   Procedure: LEFT BREAST RADIOACTIVE SEED LOCALIZED LUMPECTOMY AND SENTINEL NODE BIOPSY;  Surgeon: Jovita Kussmaul, MD;  Location: [facility];  Service: General;  Laterality: Left;  GEN & PEC BLOCK   CESAREAN SECTION     FOOT SURGERY Right    PORTACATH PLACEMENT Right 08/09/2021   Procedure: PORT PLACEMENT RIGHT  INTERNAL JUGULAR  WITH ULTRASOUND GUIDANCE;  Surgeon: Jovita Kussmaul, MD;  Location: [facility];  Service: General;  Laterality: Right;  RIGHT INTERNAL JUGULAR PLACEMENT   Patient Active Problem List   Diagnosis Date Noted   Antineoplastic chemotherapy induced anemia 10/18/2021   Port-A-Cath in place 09/12/2021   Iron deficiency anemia 09/02/2021   Genetic testing 08/05/2021   Family history of breast cancer 07/31/2021   Atherosclerotic heart disease 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 07/31/2021   Easy bruising 07/31/2021   Essential hypertension 07/31/2021   Hearing loss in left ear 07/31/2021   Insomnia 07/31/2021   Mixed hyperlipidemia 07/31/2021   Other long term (current) drug therapy 07/31/2021   Sleep disorder 07/31/2021   Tendency toward bleeding easily ([place])  07/31/2021   Vitamin D deficiency 07/31/2021   Malignant neoplasm of upper-outer quadrant of left breast in female, estrogen receptor positive ([place]) 07/25/2021   Abnormal finding on mammography 07/10/2021   GERD (gastroesophageal reflux disease) 08/31/2019   Globus pharyngeus 08/02/2019   Rhinitis, chronic 08/02/2019   Elevated blood pressure reading 01/26/2019   Acute pain of right knee 10/26/2017    REFERRING DIAG: left breast cancer at risk for lymphedema  THERAPY DIAG: Aftercare following surgery for neoplasm  PERTINENT HISTORY: Patient was diagnosed on 07/08/2021 with left grade 3 invasive ductal carcinoma breast cancer. It measures 7 mm and is located in the upper outer quadrant. It is ER positive, PR negative, and HER2 positive with a Ki67 of 35%. Left lumpectomy and SLNB on 08/09/21 with port placement and 3 negative lymph nodes. Underwent chemotherapy with taxol and herceptin.   PRECAUTIONS: left UE Lymphedema risk  SUBJECTIVE: Patient requested another SOZO screen (last one was 11/11/2021) because her massage therapist notice swelling in her axillary region.  PAIN:  Are you having pain? No  LYMPHEDEMA ASSESSMENTS:    Patient was assessed today using the SOZO machine to determine the lymphedema index score. This was compared to her baseline score. It was determined that she is within the recommended range when compared to her baseline and no further action is needed at this time. She will continue SOZO screenings. These are done every 3 months for 2 years post operatively followed by  every 6 months for 2 years, and then annually.        L-DEX FLOWSHEETS - 12/30/21 1600       L-DEX LYMPHEDEMA SCREENING   Measurement Type Unilateral    L-DEX MEASUREMENT EXTREMITY Upper Extremity    POSITION  Standing    DOMINANT SIDE Right    At Risk Side Left    BASELINE SCORE (UNILATERAL) -2.1    L-DEX SCORE (UNILATERAL) -2.6    VALUE CHANGE (UNILAT) -0.5                Collie Siad, PTA 12/30/21 4:51 PM

## 2021-12-31 ENCOUNTER — Ambulatory Visit: Payer: No Typology Code available for payment source | Admitting: Radiation Oncology

## 2022-01-01 ENCOUNTER — Ambulatory Visit
Admission: RE | Admit: 2022-01-01 | Discharge: 2022-01-01 | Disposition: A | Discharge status: Home / Self Care | Payer: No Typology Code available for payment source | Source: Ambulatory Visit | Attending: Radiation Oncology | Admitting: Radiation Oncology

## 2022-01-01 DIAGNOSIS — Z5112 Encounter for antineoplastic immunotherapy: Secondary | ICD-10-CM | POA: Insufficient documentation

## 2022-01-01 DIAGNOSIS — C50412 Malignant neoplasm of upper-outer quadrant of left female breast: Secondary | ICD-10-CM | POA: Insufficient documentation

## 2022-01-01 DIAGNOSIS — Z170 Estrogen receptor positive status [ER+]: Secondary | ICD-10-CM | POA: Insufficient documentation

## 2022-01-01 DIAGNOSIS — Z510 Encounter for antineoplastic radiation therapy: Secondary | ICD-10-CM | POA: Insufficient documentation

## 2022-01-02 ENCOUNTER — Ambulatory Visit: Payer: No Typology Code available for payment source

## 2022-01-02 ENCOUNTER — Inpatient Hospital Stay: Payer: No Typology Code available for payment source | Attending: Adult Health

## 2022-01-02 VITALS — BP 114/62 | HR 56 | Temp 98.0°F | Resp 17 | Wt 135.5 lb

## 2022-01-02 DIAGNOSIS — Z5112 Encounter for antineoplastic immunotherapy: Secondary | ICD-10-CM | POA: Diagnosis not present

## 2022-01-02 DIAGNOSIS — Z170 Estrogen receptor positive status [ER+]: Secondary | ICD-10-CM

## 2022-01-02 MED ORDER — HEPARIN SOD (PORK) LOCK FLUSH 100 UNIT/ML IV SOLN
500.0000 [IU] | Freq: Once | INTRAVENOUS | Status: AC | PRN
  Administered 2022-01-02: 500 [IU]

## 2022-01-02 MED ORDER — SODIUM CHLORIDE 0.9 % IV SOLN: Freq: Once | INTRAVENOUS | Status: AC

## 2022-01-02 MED ORDER — DIPHENHYDRAMINE HCL 25 MG PO CAPS
50.0000 mg | ORAL_CAPSULE | Freq: Once | ORAL | Status: AC
  Administered 2022-01-02: 50 mg via ORAL
  Filled 2022-01-02: qty 2

## 2022-01-02 MED ORDER — SODIUM CHLORIDE 0.9% FLUSH
10.0000 mL | INTRAVENOUS | Status: DC | PRN
  Administered 2022-01-02: 10 mL

## 2022-01-02 MED ORDER — ACETAMINOPHEN 325 MG PO TABS
650.0000 mg | ORAL_TABLET | Freq: Once | ORAL | Status: AC
  Administered 2022-01-02: 650 mg via ORAL
  Filled 2022-01-02: qty 2

## 2022-01-02 MED ORDER — TRASTUZUMAB-DKST CHEMO 150 MG IV SOLR
6.0000 mg/kg | Freq: Once | INTRAVENOUS | Status: AC
  Administered 2022-01-02: 378 mg via INTRAVENOUS
  Filled 2022-01-02: qty 18

## 2022-01-03 DIAGNOSIS — C50412 Malignant neoplasm of upper-outer quadrant of left female breast: Secondary | ICD-10-CM | POA: Insufficient documentation

## 2022-01-03 DIAGNOSIS — Z510 Encounter for antineoplastic radiation therapy: Secondary | ICD-10-CM | POA: Insufficient documentation

## 2022-01-03 DIAGNOSIS — Z170 Estrogen receptor positive status [ER+]: Secondary | ICD-10-CM | POA: Insufficient documentation

## 2022-01-06 ENCOUNTER — Encounter: Payer: Self-pay | Admitting: *Deleted

## 2022-01-06 DIAGNOSIS — Z79810 Long term (current) use of selective estrogen receptor modulators (SERMs): Secondary | ICD-10-CM | POA: Diagnosis not present

## 2022-01-06 DIAGNOSIS — E782 Mixed hyperlipidemia: Secondary | ICD-10-CM | POA: Diagnosis not present

## 2022-01-06 DIAGNOSIS — Z803 Family history of malignant neoplasm of breast: Secondary | ICD-10-CM | POA: Diagnosis not present

## 2022-01-06 DIAGNOSIS — Z801 Family history of malignant neoplasm of trachea, bronchus and lung: Secondary | ICD-10-CM | POA: Diagnosis not present

## 2022-01-06 DIAGNOSIS — Z510 Encounter for antineoplastic radiation therapy: Secondary | ICD-10-CM | POA: Diagnosis present

## 2022-01-06 DIAGNOSIS — I10 Essential (primary) hypertension: Secondary | ICD-10-CM | POA: Diagnosis not present

## 2022-01-06 DIAGNOSIS — G4700 Insomnia, unspecified: Secondary | ICD-10-CM | POA: Diagnosis not present

## 2022-01-06 DIAGNOSIS — Z170 Estrogen receptor positive status [ER+]: Secondary | ICD-10-CM | POA: Diagnosis not present

## 2022-01-06 DIAGNOSIS — D6481 Anemia due to antineoplastic chemotherapy: Secondary | ICD-10-CM | POA: Diagnosis not present

## 2022-01-06 DIAGNOSIS — G629 Polyneuropathy, unspecified: Secondary | ICD-10-CM | POA: Diagnosis not present

## 2022-01-06 DIAGNOSIS — E559 Vitamin D deficiency, unspecified: Secondary | ICD-10-CM | POA: Diagnosis not present

## 2022-01-06 DIAGNOSIS — K219 Gastro-esophageal reflux disease without esophagitis: Secondary | ICD-10-CM | POA: Diagnosis not present

## 2022-01-06 DIAGNOSIS — C50412 Malignant neoplasm of upper-outer quadrant of left female breast: Secondary | ICD-10-CM | POA: Diagnosis not present

## 2022-01-07 ENCOUNTER — Encounter: Payer: Self-pay | Admitting: Hematology and Oncology

## 2022-01-08 ENCOUNTER — Other Ambulatory Visit: Payer: Self-pay

## 2022-01-08 ENCOUNTER — Ambulatory Visit
Admission: RE | Admit: 2022-01-08 | Discharge: 2022-01-08 | Disposition: A | Discharge status: Home / Self Care | Payer: No Typology Code available for payment source | Source: Ambulatory Visit | Attending: Radiation Oncology | Admitting: Radiation Oncology

## 2022-01-08 DIAGNOSIS — Z923 Personal history of irradiation: Secondary | ICD-10-CM

## 2022-01-08 DIAGNOSIS — Z510 Encounter for antineoplastic radiation therapy: Secondary | ICD-10-CM | POA: Diagnosis not present

## 2022-01-08 HISTORY — DX: Personal history of irradiation: Z92.3

## 2022-01-08 LAB — RAD ONC ARIA SESSION SUMMARY
Course Elapsed Days: 0
Plan Fractions Treated to Date: 1
Plan Prescribed Dose Per Fraction: 2.66 Gy
Plan Total Fractions Prescribed: 16
Plan Total Prescribed Dose: 42.56 Gy
Reference Point Dosage Given to Date: 2.66 Gy
Reference Point Session Dosage Given: 2.66 Gy
Session Number: 1

## 2022-01-09 ENCOUNTER — Ambulatory Visit
Admission: RE | Admit: 2022-01-09 | Discharge: 2022-01-09 | Disposition: A | Discharge status: Home / Self Care | Payer: No Typology Code available for payment source | Source: Ambulatory Visit | Attending: Radiation Oncology | Admitting: Radiation Oncology

## 2022-01-09 ENCOUNTER — Other Ambulatory Visit: Payer: Self-pay

## 2022-01-09 DIAGNOSIS — Z510 Encounter for antineoplastic radiation therapy: Secondary | ICD-10-CM | POA: Diagnosis not present

## 2022-01-09 LAB — RAD ONC ARIA SESSION SUMMARY
Course Elapsed Days: 1
Plan Fractions Treated to Date: 2
Plan Prescribed Dose Per Fraction: 2.66 Gy
Plan Total Fractions Prescribed: 16
Plan Total Prescribed Dose: 42.56 Gy
Reference Point Dosage Given to Date: 5.32 Gy
Reference Point Session Dosage Given: 2.66 Gy
Session Number: 2

## 2022-01-10 ENCOUNTER — Ambulatory Visit
Admission: RE | Admit: 2022-01-10 | Discharge: 2022-01-10 | Disposition: A | Discharge status: Home / Self Care | Payer: No Typology Code available for payment source | Source: Ambulatory Visit | Attending: Radiation Oncology | Admitting: Radiation Oncology

## 2022-01-10 ENCOUNTER — Other Ambulatory Visit: Payer: Self-pay

## 2022-01-10 DIAGNOSIS — Z170 Estrogen receptor positive status [ER+]: Secondary | ICD-10-CM

## 2022-01-10 DIAGNOSIS — Z510 Encounter for antineoplastic radiation therapy: Secondary | ICD-10-CM | POA: Diagnosis not present

## 2022-01-10 LAB — RAD ONC ARIA SESSION SUMMARY
Course Elapsed Days: 2
Plan Fractions Treated to Date: 3
Plan Prescribed Dose Per Fraction: 2.66 Gy
Plan Total Fractions Prescribed: 16
Plan Total Prescribed Dose: 42.56 Gy
Reference Point Dosage Given to Date: 7.98 Gy
Reference Point Session Dosage Given: 2.66 Gy
Session Number: 3

## 2022-01-10 MED ORDER — ALRA NON-METALLIC DEODORANT (RAD-ONC)
1.0000 | Freq: Once | TOPICAL | Status: AC
  Administered 2022-01-10: 1 via TOPICAL

## 2022-01-10 MED ORDER — RADIAPLEXRX EX GEL: Freq: Once | CUTANEOUS | Status: AC

## 2022-01-13 ENCOUNTER — Ambulatory Visit
Admission: RE | Admit: 2022-01-13 | Discharge: 2022-01-13 | Disposition: A | Discharge status: Home / Self Care | Payer: No Typology Code available for payment source | Source: Ambulatory Visit | Attending: Radiation Oncology | Admitting: Radiation Oncology

## 2022-01-13 ENCOUNTER — Other Ambulatory Visit: Payer: Self-pay

## 2022-01-13 DIAGNOSIS — Z510 Encounter for antineoplastic radiation therapy: Secondary | ICD-10-CM | POA: Diagnosis not present

## 2022-01-13 LAB — RAD ONC ARIA SESSION SUMMARY
Course Elapsed Days: 5
Plan Fractions Treated to Date: 4
Plan Prescribed Dose Per Fraction: 2.66 Gy
Plan Total Fractions Prescribed: 16
Plan Total Prescribed Dose: 42.56 Gy
Reference Point Dosage Given to Date: 10.64 Gy
Reference Point Session Dosage Given: 2.66 Gy
Session Number: 4

## 2022-01-14 ENCOUNTER — Other Ambulatory Visit: Payer: Self-pay

## 2022-01-14 ENCOUNTER — Ambulatory Visit
Admission: RE | Admit: 2022-01-14 | Discharge: 2022-01-14 | Disposition: A | Discharge status: Home / Self Care | Payer: No Typology Code available for payment source | Source: Ambulatory Visit | Attending: Radiation Oncology | Admitting: Radiation Oncology

## 2022-01-14 DIAGNOSIS — Z510 Encounter for antineoplastic radiation therapy: Secondary | ICD-10-CM | POA: Diagnosis not present

## 2022-01-14 LAB — RAD ONC ARIA SESSION SUMMARY
Course Elapsed Days: 6
Plan Fractions Treated to Date: 5
Plan Prescribed Dose Per Fraction: 2.66 Gy
Plan Total Fractions Prescribed: 16
Plan Total Prescribed Dose: 42.56 Gy
Reference Point Dosage Given to Date: 13.3 Gy
Reference Point Session Dosage Given: 2.66 Gy
Session Number: 5

## 2022-01-15 ENCOUNTER — Ambulatory Visit
Admission: RE | Admit: 2022-01-15 | Discharge: 2022-01-15 | Disposition: A | Discharge status: Home / Self Care | Payer: No Typology Code available for payment source | Source: Ambulatory Visit | Attending: Radiation Oncology | Admitting: Radiation Oncology

## 2022-01-15 ENCOUNTER — Other Ambulatory Visit: Payer: Self-pay

## 2022-01-15 DIAGNOSIS — Z510 Encounter for antineoplastic radiation therapy: Secondary | ICD-10-CM | POA: Diagnosis not present

## 2022-01-15 LAB — RAD ONC ARIA SESSION SUMMARY
Course Elapsed Days: 7
Plan Fractions Treated to Date: 6
Plan Prescribed Dose Per Fraction: 2.66 Gy
Plan Total Fractions Prescribed: 16
Plan Total Prescribed Dose: 42.56 Gy
Reference Point Dosage Given to Date: 15.96 Gy
Reference Point Session Dosage Given: 2.66 Gy
Session Number: 6

## 2022-01-16 ENCOUNTER — Ambulatory Visit
Admission: RE | Admit: 2022-01-16 | Discharge: 2022-01-16 | Disposition: A | Discharge status: Home / Self Care | Payer: No Typology Code available for payment source | Source: Ambulatory Visit | Attending: Radiation Oncology | Admitting: Radiation Oncology

## 2022-01-16 ENCOUNTER — Other Ambulatory Visit: Payer: Self-pay

## 2022-01-16 DIAGNOSIS — Z510 Encounter for antineoplastic radiation therapy: Secondary | ICD-10-CM | POA: Diagnosis not present

## 2022-01-16 LAB — RAD ONC ARIA SESSION SUMMARY
Course Elapsed Days: 8
Plan Fractions Treated to Date: 7
Plan Prescribed Dose Per Fraction: 2.66 Gy
Plan Total Fractions Prescribed: 16
Plan Total Prescribed Dose: 42.56 Gy
Reference Point Dosage Given to Date: 18.62 Gy
Reference Point Session Dosage Given: 2.66 Gy
Session Number: 7

## 2022-01-17 ENCOUNTER — Ambulatory Visit
Admission: RE | Admit: 2022-01-17 | Discharge: 2022-01-17 | Disposition: A | Discharge status: Home / Self Care | Payer: No Typology Code available for payment source | Source: Ambulatory Visit | Attending: Radiation Oncology | Admitting: Radiation Oncology

## 2022-01-17 ENCOUNTER — Other Ambulatory Visit: Payer: Self-pay

## 2022-01-17 DIAGNOSIS — Z510 Encounter for antineoplastic radiation therapy: Secondary | ICD-10-CM | POA: Diagnosis not present

## 2022-01-17 LAB — RAD ONC ARIA SESSION SUMMARY
Course Elapsed Days: 9
Plan Fractions Treated to Date: 8
Plan Prescribed Dose Per Fraction: 2.66 Gy
Plan Total Fractions Prescribed: 16
Plan Total Prescribed Dose: 42.56 Gy
Reference Point Dosage Given to Date: 21.28 Gy
Reference Point Session Dosage Given: 2.66 Gy
Session Number: 8

## 2022-01-20 ENCOUNTER — Ambulatory Visit
Admission: RE | Admit: 2022-01-20 | Discharge: 2022-01-20 | Disposition: A | Discharge status: Home / Self Care | Payer: No Typology Code available for payment source | Source: Ambulatory Visit | Attending: Radiation Oncology | Admitting: Radiation Oncology

## 2022-01-20 ENCOUNTER — Other Ambulatory Visit: Payer: Self-pay

## 2022-01-20 DIAGNOSIS — Z510 Encounter for antineoplastic radiation therapy: Secondary | ICD-10-CM | POA: Diagnosis not present

## 2022-01-20 LAB — RAD ONC ARIA SESSION SUMMARY
Course Elapsed Days: 12
Plan Fractions Treated to Date: 9
Plan Prescribed Dose Per Fraction: 2.66 Gy
Plan Total Fractions Prescribed: 16
Plan Total Prescribed Dose: 42.56 Gy
Reference Point Dosage Given to Date: 23.94 Gy
Reference Point Session Dosage Given: 2.66 Gy
Session Number: 9

## 2022-01-21 ENCOUNTER — Ambulatory Visit
Admission: RE | Admit: 2022-01-21 | Discharge: 2022-01-21 | Disposition: A | Discharge status: Home / Self Care | Payer: No Typology Code available for payment source | Source: Ambulatory Visit | Attending: Radiation Oncology | Admitting: Radiation Oncology

## 2022-01-21 ENCOUNTER — Inpatient Hospital Stay: Payer: No Typology Code available for payment source | Attending: Hematology and Oncology

## 2022-01-21 ENCOUNTER — Inpatient Hospital Stay (HOSPITAL_BASED_OUTPATIENT_CLINIC_OR_DEPARTMENT_OTHER): Payer: No Typology Code available for payment source | Admitting: Hematology and Oncology

## 2022-01-21 ENCOUNTER — Other Ambulatory Visit: Payer: Self-pay

## 2022-01-21 ENCOUNTER — Inpatient Hospital Stay: Payer: No Typology Code available for payment source

## 2022-01-21 ENCOUNTER — Encounter: Payer: Self-pay | Admitting: Hematology and Oncology

## 2022-01-21 VITALS — BP 104/60 | HR 67 | Resp 16

## 2022-01-21 VITALS — BP 123/63 | HR 73 | Temp 99.3°F | Resp 16 | Wt 135.8 lb

## 2022-01-21 DIAGNOSIS — Z510 Encounter for antineoplastic radiation therapy: Secondary | ICD-10-CM | POA: Diagnosis not present

## 2022-01-21 DIAGNOSIS — Z170 Estrogen receptor positive status [ER+]: Secondary | ICD-10-CM

## 2022-01-21 DIAGNOSIS — Z803 Family history of malignant neoplasm of breast: Secondary | ICD-10-CM | POA: Insufficient documentation

## 2022-01-21 DIAGNOSIS — Z79810 Long term (current) use of selective estrogen receptor modulators (SERMs): Secondary | ICD-10-CM | POA: Insufficient documentation

## 2022-01-21 DIAGNOSIS — C50412 Malignant neoplasm of upper-outer quadrant of left female breast: Secondary | ICD-10-CM | POA: Diagnosis not present

## 2022-01-21 DIAGNOSIS — G4700 Insomnia, unspecified: Secondary | ICD-10-CM | POA: Insufficient documentation

## 2022-01-21 DIAGNOSIS — K219 Gastro-esophageal reflux disease without esophagitis: Secondary | ICD-10-CM | POA: Insufficient documentation

## 2022-01-21 DIAGNOSIS — E782 Mixed hyperlipidemia: Secondary | ICD-10-CM | POA: Insufficient documentation

## 2022-01-21 DIAGNOSIS — D6481 Anemia due to antineoplastic chemotherapy: Secondary | ICD-10-CM | POA: Insufficient documentation

## 2022-01-21 DIAGNOSIS — G629 Polyneuropathy, unspecified: Secondary | ICD-10-CM | POA: Insufficient documentation

## 2022-01-21 DIAGNOSIS — I10 Essential (primary) hypertension: Secondary | ICD-10-CM | POA: Insufficient documentation

## 2022-01-21 DIAGNOSIS — E559 Vitamin D deficiency, unspecified: Secondary | ICD-10-CM | POA: Insufficient documentation

## 2022-01-21 DIAGNOSIS — Z801 Family history of malignant neoplasm of trachea, bronchus and lung: Secondary | ICD-10-CM | POA: Insufficient documentation

## 2022-01-21 LAB — RAD ONC ARIA SESSION SUMMARY
Course Elapsed Days: 13
Course Elapsed Days: 13
Plan Fractions Treated to Date: 10
Plan Fractions Treated to Date: 11
Plan Prescribed Dose Per Fraction: 2.66 Gy
Plan Prescribed Dose Per Fraction: 2.66 Gy
Plan Total Fractions Prescribed: 16
Plan Total Fractions Prescribed: 16
Plan Total Prescribed Dose: 42.56 Gy
Plan Total Prescribed Dose: 42.56 Gy
Reference Point Dosage Given to Date: 26.6 Gy
Reference Point Dosage Given to Date: 29.26 Gy
Reference Point Session Dosage Given: 2.66 Gy
Reference Point Session Dosage Given: 2.66 Gy
Session Number: 10
Session Number: 11

## 2022-01-21 LAB — CMP (CANCER CENTER ONLY)
ALT: 38 U/L (ref 0–44)
AST: 36 U/L (ref 15–41)
Albumin: 4.3 g/dL (ref 3.5–5.0)
Alkaline Phosphatase: 45 U/L (ref 38–126)
Anion gap: 6 (ref 5–15)
BUN: 21 mg/dL (ref 8–23)
CO2: 27 mmol/L (ref 22–32)
Calcium: 8.8 mg/dL — ABNORMAL LOW (ref 8.9–10.3)
Chloride: 102 mmol/L (ref 98–111)
Creatinine: 0.67 mg/dL (ref 0.44–1.00)
GFR, Estimated: >60 mL/min (ref >60)
Glucose, Bld: 100 mg/dL — ABNORMAL HIGH (ref 70–99)
Potassium: 3.6 mmol/L (ref 3.5–5.1)
Sodium: 135 mmol/L (ref 135–145)
Total Bilirubin: 1.3 mg/dL — ABNORMAL HIGH (ref 0.3–1.2)
Total Protein: 7.1 g/dL (ref 6.5–8.1)

## 2022-01-21 LAB — CBC WITH DIFFERENTIAL (CANCER CENTER ONLY)
Abs Immature Granulocytes: 0.01 10*3/uL (ref 0.00–0.07)
Basophils Absolute: 0 10*3/uL (ref 0.0–0.1)
Basophils Relative: 1 %
Eosinophils Absolute: 0.1 10*3/uL (ref 0.0–0.5)
Eosinophils Relative: 2 %
HCT: 32.4 % — ABNORMAL LOW (ref 36.0–46.0)
Hemoglobin: 11.1 g/dL — ABNORMAL LOW (ref 12.0–15.0)
Immature Granulocytes: 0 %
Lymphocytes Relative: 21 %
Lymphs Abs: 1.2 10*3/uL (ref 0.7–4.0)
MCH: 31.4 pg (ref 26.0–34.0)
MCHC: 34.3 g/dL (ref 30.0–36.0)
MCV: 91.5 fL (ref 80.0–100.0)
Monocytes Absolute: 0.4 10*3/uL (ref 0.1–1.0)
Monocytes Relative: 7 %
Neutro Abs: 4 10*3/uL (ref 1.7–7.7)
Neutrophils Relative %: 69 %
Platelet Count: 256 10*3/uL (ref 150–400)
RBC: 3.54 MIL/uL — ABNORMAL LOW (ref 3.87–5.11)
RDW: 12.6 % (ref 11.5–15.5)
WBC Count: 5.7 10*3/uL (ref 4.0–10.5)
nRBC: 0 % (ref 0.0–0.2)

## 2022-01-21 MED ORDER — SODIUM CHLORIDE 0.9 % IV SOLN: Freq: Once | INTRAVENOUS | Status: AC

## 2022-01-21 MED ORDER — TRASTUZUMAB-DKST CHEMO 150 MG IV SOLR
6.0000 mg/kg | Freq: Once | INTRAVENOUS | Status: AC
  Administered 2022-01-21: 378 mg via INTRAVENOUS
  Filled 2022-01-21: qty 18

## 2022-01-21 MED ORDER — SODIUM CHLORIDE 0.9% FLUSH
10.0000 mL | INTRAVENOUS | Status: DC | PRN
  Administered 2022-01-21: 10 mL

## 2022-01-21 MED ORDER — SODIUM CHLORIDE 0.9% FLUSH
10.0000 mL | Freq: Once | INTRAVENOUS | Status: AC
  Administered 2022-01-21: 10 mL via INTRAVENOUS

## 2022-01-21 MED ORDER — DIPHENHYDRAMINE HCL 25 MG PO CAPS
25.0000 mg | ORAL_CAPSULE | Freq: Once | ORAL | Status: AC
  Administered 2022-01-21: 25 mg via ORAL
  Filled 2022-01-21: qty 1

## 2022-01-21 MED ORDER — ACETAMINOPHEN 325 MG PO TABS
650.0000 mg | ORAL_TABLET | Freq: Once | ORAL | Status: AC
  Administered 2022-01-21: 650 mg via ORAL
  Filled 2022-01-21: qty 2

## 2022-01-21 MED ORDER — TAMOXIFEN CITRATE 20 MG PO TABS: 20.0000 mg | ORAL_TABLET | Freq: Every day | ORAL | 3 refills | Status: DC

## 2022-01-21 MED ORDER — HEPARIN SOD (PORK) LOCK FLUSH 100 UNIT/ML IV SOLN
500.0000 [IU] | Freq: Once | INTRAVENOUS | Status: AC | PRN
  Administered 2022-01-21: 500 [IU]

## 2022-01-21 NOTE — Progress Notes (Addendum)
[facility] Follow up:    Christine Huddle, MD [address] [place] [address]   DIAGNOSIS:  Cancer Staging  Malignant neoplasm of upper-outer quadrant of left breast in female, estrogen receptor positive ([place]) Staging form: Breast, AJCC 8th Edition - Clinical stage from 07/29/2021: Stage IA (cT1c, cN0, cM0, G3, ER+, PR-, HER2+) - Signed by Hayden Pedro, PA-C on 07/31/2021 Stage prefix: Initial diagnosis Method of lymph node assessment: Clinical Histologic grading system: 3 grade system   SUMMARY OF ONCOLOGIC HISTORY: Oncology History  Malignant neoplasm of upper-outer quadrant of left breast in female, estrogen receptor positive ([place])  07/17/2021 Mammogram   Suspicious left breast mass at 3 o'clock, 4 cm from the nipple. No axillary adenopathy. Targeted ultrasound is performed, showing an irregular centrally hypoechoic mass with an echogenic rim measuring 7 x 5 by 7 mm, thought to correlate with the mammographically identified mass. This mass is located at 3 o'clock, 4 cm from the nipple. No axillary adenopathy.   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female, estrogen receptor positive ([place])   07/29/2021 Cancer Staging   Staging form: Breast, AJCC 8th Edition - Clinical stage from 07/29/2021: Stage IA (cT1c, cN0, cM0, G3, ER+, PR-, HER2+) - Signed by Hayden Pedro, PA-C on 07/31/2021 Stage prefix: Initial diagnosis Method of lymph node assessment: Clinical Histologic grading system: 3 grade system    Pathology Results   Path showed IDC, grade 3, ER 100% positive, strong staining intensity, PR negative, ki 67 35%, Her 2 positive   08/05/2021 Genetic Testing   Negative hereditary cancer genetic testing: no pathogenic variants detected in [facility] BRCAPlus Panel and [facility] CustomNext-Cancer +RNAinsight Panel.  Report dates are August 05, 2021 and August 08, 2021.    The BRCAplus panel  offered by [employer] and includes sequencing and deletion/duplication analysis for the following 8 genes: ATM, BRCA1, BRCA2, CDH1, CHEK2, PALB2, PTEN, and TP53. The CustomNext-Cancer+RNAinsight panel offered by [facility] includes sequencing and rearrangement analysis for the following 47 genes:  APC, ATM, AXIN2, BARD1, BMPR1A, BRCA1, BRCA2, BRIP1, CDH1, CDK4, CDKN2A, CHEK2, DICER1, EPCAM, GREM1, HOXB13, MEN1, MLH1, MSH2, MSH3, MSH6, MUTYH, NBN, NF1, NF2, NTHL1, PALB2, PMS2, POLD1, POLE, PTEN, RAD51C, RAD51D, RECQL, RET, SDHA, SDHAF2, SDHB, SDHC, SDHD, SMAD4, SMARCA4, STK11, TP53, TSC1, TSC2, and VHL.  RNA data is routinely analyzed for use in variant interpretation for all genes.   08/09/2021 Surgery   Left lumpectomy: IDC, grade 3, 3 sentinel lymph nodes all negative for cancer.  He has biomarker testing ER positive, PR negative, HER2 positive.  pT1c, PN0.   09/12/2021 -  Adjuvant Chemotherapy   Taxol/Herceptin weekly x12, followed by Herceptin given every 3 weeks to complete 1 year of treatment.     CURRENT THERAPY: Maintenance Herceptin, to start adjuvant radiation  INTERVAL HISTORY:  Christine [name] 61 y.o. female returns for follow-up and evaluation prior to receiving Herceptin therapy.  She has completed her initial chemotherapy with Taxol and Herceptin.  Most recent echocardiogram occurred on November 08, 2021 and showed a left ventricular ejection fraction of 60 to 65%. Patient is tolerating Herceptin extremely well.  She is halfway through radiation.  She denies any complaints.  Neuropathy continues to get better, does not interfere with activities of daily living.  Nails continue to be discolored.  Rest of the pertinent 10 point ROS reviewed and negative  Patient Active Problem List   Diagnosis Date Noted  Antineoplastic chemotherapy induced anemia 10/18/2021   Port-A-Cath in place 09/12/2021   Iron deficiency anemia 09/02/2021   Genetic testing 08/05/2021   Family history of  breast cancer 07/31/2021   Atherosclerotic heart disease 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 07/31/2021   Easy bruising 07/31/2021   Essential hypertension 07/31/2021   Hearing loss in left ear 07/31/2021   Insomnia 07/31/2021   Mixed hyperlipidemia 07/31/2021   Other long term (current) drug therapy 07/31/2021   Sleep disorder 07/31/2021   Tendency toward bleeding easily ([place]) 07/31/2021   Vitamin D deficiency 07/31/2021   Malignant neoplasm of upper-outer quadrant of left breast in female, estrogen receptor positive ([place]) 07/25/2021   Abnormal finding on mammography 07/10/2021   GERD (gastroesophageal reflux disease) 08/31/2019   Globus pharyngeus 08/02/2019   Rhinitis, chronic 08/02/2019   Elevated blood pressure reading 01/26/2019   Acute pain of right knee 10/26/2017    is allergic to lisinopril and mefloquine.  MEDICAL HISTORY: Past Medical History:  Diagnosis Date   Family history of breast cancer 07/31/2021   GERD (gastroesophageal reflux disease)    Hypertension     SURGICAL HISTORY: Past Surgical History:  Procedure Laterality Date   BREAST LUMPECTOMY WITH RADIOACTIVE SEED AND SENTINEL LYMPH NODE BIOPSY Left 08/09/2021   Procedure: LEFT BREAST RADIOACTIVE SEED LOCALIZED LUMPECTOMY AND SENTINEL NODE BIOPSY;  Surgeon: Jovita Kussmaul, MD;  Location: [facility];  Service: General;  Laterality: Left;  GEN & PEC BLOCK   CESAREAN SECTION     FOOT SURGERY Right    PORTACATH PLACEMENT Right 08/09/2021   Procedure: PORT PLACEMENT RIGHT  INTERNAL JUGULAR  WITH ULTRASOUND GUIDANCE;  Surgeon: Jovita Kussmaul, MD;  Location: [facility];  Service: General;  Laterality: Right;  RIGHT INTERNAL JUGULAR PLACEMENT    SOCIAL HISTORY: Social History   Socioeconomic History   Marital status: Married    Spouse name: Not on file   Number of children: Not on file   Years of education: Not on file   Highest education level: Not on file  Occupational History   Not on file  Tobacco  Use   Smoking status: Never   Smokeless tobacco: Never  Vaping Use   Vaping Use: Never used  Substance and Sexual Activity   Alcohol use: Yes    Alcohol/week: 2.0 standard drinks of alcohol    Types: 2 Glasses of wine per week   Drug use: Never   Sexual activity: Not on file  Other Topics Concern   Not on file  Social History Narrative   Not on file   Social Determinants of Health   Financial Resource Strain: Not on file  Food Insecurity: Not on file  Transportation Needs: Not on file  Physical Activity: Not on file  Stress: Not on file  Social Connections: Not on file  Intimate Partner Violence: Not on file    FAMILY HISTORY: Family History  Problem Relation Age of Onset   Heart disease Father    Breast cancer Sister 61   Esophageal cancer Maternal Uncle        dx after age 24   Lung cancer Paternal Uncle    Skin cancer Paternal Uncle     Review of Systems  Constitutional:  Negative for appetite change, chills, fatigue, fever and unexpected weight change.  HENT:   Negative for hearing loss, lump/mass and trouble swallowing.   Eyes:  Negative for eye problems and icterus.  Respiratory:  Negative for chest tightness, cough and shortness  of breath.   Cardiovascular:  Negative for chest pain, leg swelling and palpitations.  Gastrointestinal:  Negative for abdominal distention, abdominal pain, constipation, diarrhea, nausea and vomiting.  Endocrine: Negative for hot flashes.  Genitourinary:  Negative for difficulty urinating.   Musculoskeletal:  Negative for arthralgias.  Skin:  Negative for itching and rash.  Neurological:  Positive for numbness. Negative for dizziness, extremity weakness and headaches.  Hematological:  Negative for adenopathy. Does not bruise/bleed easily.  Psychiatric/Behavioral:  Negative for depression. The patient is not nervous/anxious.       PHYSICAL EXAMINATION  ECOG PERFORMANCE STATUS: 1 - Symptomatic but completely ambulatory  Vitals:    01/21/22 1045  BP: 123/63  Pulse: 73  Resp: 16  Temp: 99.3 F (37.4 C)  SpO2: 100%    Physical Exam Constitutional:      General: She is not in acute distress.    Appearance: Normal appearance. She is not toxic-appearing.  HENT:     Head: Normocephalic and atraumatic.  Eyes:     General: No scleral icterus. Cardiovascular:     Rate and Rhythm: Normal rate and regular rhythm.     Pulses: Normal pulses.     Heart sounds: Normal heart sounds.  Pulmonary:     Effort: Pulmonary effort is normal.     Breath sounds: Normal breath sounds.  Abdominal:     General: Abdomen is flat. Bowel sounds are normal. There is no distension.     Palpations: Abdomen is soft.     Tenderness: There is no abdominal tenderness.  Musculoskeletal:        General: No swelling.     Cervical back: Neck supple.  Lymphadenopathy:     Cervical: No cervical adenopathy.  Skin:    General: Skin is warm and dry.     Findings: No rash.  Neurological:     General: No focal deficit present.     Mental Status: She is alert.  Psychiatric:        Mood and Affect: Mood normal.        Behavior: Behavior normal.     LABORATORY DATA:  CBC    Component Value Date/Time   WBC 5.7 01/21/2022 1031   WBC 6.8 06/16/2019 1456   RBC 3.54 (L) 01/21/2022 1031   HGB 11.1 (L) 01/21/2022 1031   HCT 32.4 (L) 01/21/2022 1031   PLT 256 01/21/2022 1031   MCV 91.5 01/21/2022 1031   MCH 31.4 01/21/2022 1031   MCHC 34.3 01/21/2022 1031   RDW 12.6 01/21/2022 1031   LYMPHSABS 1.2 01/21/2022 1031   MONOABS 0.4 01/21/2022 1031   EOSABS 0.1 01/21/2022 1031   BASOSABS 0.0 01/21/2022 1031    CMP     Component Value Date/Time   NA 135 01/21/2022 1031   NA 142 10/31/2019 1545   K 3.6 01/21/2022 1031   CL 102 01/21/2022 1031   CO2 27 01/21/2022 1031   GLUCOSE 100 (H) 01/21/2022 1031   BUN 21 01/21/2022 1031   BUN 11 10/31/2019 1545   CREATININE 0.67 01/21/2022 1031   CALCIUM 8.8 (L) 01/21/2022 1031   PROT 7.1  01/21/2022 1031   PROT 6.7 08/01/2020 1004   ALBUMIN 4.3 01/21/2022 1031   ALBUMIN 4.5 08/01/2020 1004   AST 36 01/21/2022 1031   ALT 38 01/21/2022 1031   ALKPHOS 45 01/21/2022 1031   BILITOT 1.3 (H) 01/21/2022 1031   GFRNONAA >60 01/21/2022 1031   GFRAA 104 10/31/2019 1545     ASSESSMENT and  THERAPY PLAN:   Malignant neoplasm of upper-outer quadrant of left breast in female, estrogen receptor positive ([place]) Christine [name] is a 61 year old woman with stage Ia ER positive, PR negative, Her 2 amplified breast cancer status postlumpectomy and currently undergoing adjuvant chemotherapy with Taxol and Herceptin.  She is now status post adjuvant Herceptin and Taxol.  She is on Herceptin alone.  She is tolerating this extremely well.  She is also undergoing radiation.  Last echo with an EF of 60 to 65% and satisfactory.  No concerning review of systems or physical examination findings.  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  Nail changes will likely resolve as the new nail grows.  She will continue Herceptin.  She will complete adjuvant radiation and start adjuvant antiestrogen therapy.  We once again today discussed about tamoxifen versus unpleasantness, can interactions with each class.  We have discussed aspects of tamoxifen including but not postmenopausal symptoms, increased risk of QT/PE, endometrial cancer, benefit on bone density.  She is leaning towards tamoxifen since she osteoporosis and has underlying history of osteopenia.  She understands aromatase inhibitors may prevent bone loss.  She understands completed tamoxifen tamoxifen prescription has been dispensed to the pharmacy of her choice.  She will start this during first week of February.  She will return to clinic in 3 weeks for Herceptin every 6 weeks for follow-up.  Repeat echo has been ordered.  All questions were answered. The patient knows to call the clinic with any problems, questions or concerns. We can certainly see the patient  much sooner if necessary.  Total encounter time: 90mnutes*in face-to-face visit time, chart review, lab review, care coordination, order entry, and documentation of the encounter time.  *Total Encounter Time as defined by the Centers for Medicare and Medicaid Services includes, in addition to the face-to-face time of a patient visit (documented in the note above) non-face-to-face time: obtaining and reviewing outside history, ordering and reviewing medications, tests or procedures, care coordination (communications with other health care professionals or caregivers) and documentation in the medical record.

## 2022-01-21 NOTE — Progress Notes (Signed)
pt pref half dose Benadryl (see pt message note 12/06/21).  OK to change dose per Dr. Chryl Heck.  Kennith Center, Pharm.D., CPP 01/21/2022'@11'$ :41 AM

## 2022-01-21 NOTE — Patient Instructions (Signed)
[facility]COL[facility]  Discharge Instructions: Thank you for choosing [facility] to provide your oncology and hematology care.   If you have a lab appointment with the Cancer Center, please go directly to the Cancer Center and check in at the registration area.   Wear comfortable clothing and clothing appropriate for easy access to any Portacath or PICC line.   We strive to give you quality time with your provider. You may need to reschedule your appointment if you arrive late (15 or more minutes).  Arriving late affects you and other patients whose appointments are after yours.  Also, if you miss three or more appointments without notifying the office, you may be dismissed from the clinic at the provider's discretion.      For prescription refill requests, have your pharmacy contact our office and allow 72 hours for refills to be completed.    Today you received the following chemotherapy and/or immunotherapy agents: Ogivri      To help prevent nausea and vomiting after your treatment, we encourage you to take your nausea medication as directed.  BELOW ARE SYMPTOMS THAT SHOULD BE REPORTED IMMEDIATELY: *FEVER GREATER THAN 100.4 F (38 C) OR HIGHER *CHILLS OR SWEATING *NAUSEA AND VOMITING THAT IS NOT CONTROLLED WITH YOUR NAUSEA MEDICATION *UNUSUAL SHORTNESS OF BREATH *UNUSUAL BRUISING OR BLEEDING *URINARY PROBLEMS (pain or burning when urinating, or frequent urination) *BOWEL PROBLEMS (unusual diarrhea, constipation, pain near the anus) TENDERNESS IN MOUTH AND THROAT WITH OR WITHOUT PRESENCE OF ULCERS (sore throat, sores in mouth, or a toothache) UNUSUAL RASH, SWELLING OR PAIN  UNUSUAL VAGINAL DISCHARGE OR ITCHING   Items with * indicate a potential emergency and should be followed up as soon as possible or go to the Emergency Department if any problems should occur.  Please show the CHEMOTHERAPY ALERT CARD or IMMUNOTHERAPY ALERT CARD at check-in to the  Emergency Department and triage nurse.  Should you have questions after your visit or need to cancel or reschedule your appointment, please contact [facility]  Dept: [phone]  and follow the prompts.  Office hours are 8:00 a.m. to 4:30 p.m. Monday - Friday. Please note that voicemails left after 4:00 p.m. may not be returned until the following business day.  We are closed weekends and major holidays. You have access to a nurse at all times for urgent questions. Please call the main number to the clinic Dept: [phone] and follow the prompts.   For any non-urgent questions, you may also contact your provider using MyChart. We now offer e-Visits for anyone 18 and older to request care online for non-urgent symptoms. For details visit [email or web address].   Also download the MyChart app! Go to the app store, search "MyChart", open the app, select [facility], and log in with your MyChart username and password.  Masks are optional in the cancer centers. If you would like for your care team to wear a mask while they are taking care of you, please let them know. You may have one support person who is at least 61 years old accompany you for your appointments. 

## 2022-01-21 NOTE — Assessment & Plan Note (Addendum)
Christine [name] is a 61 year old woman with stage Ia ER positive, PR negative, Her 2 amplified breast cancer status postlumpectomy and currently undergoing adjuvant chemotherapy with Taxol and Herceptin.  She is now status post adjuvant Herceptin and Taxol.  She is on Herceptin alone.  She is tolerating this extremely well.  She is also undergoing radiation.  Last echo with an EF of 60 to 65% and satisfactory.  No concerning review of systems or physical examination findings.  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  Nail changes will likely resolve as the new nail grows.  She will continue Herceptin.  She will complete adjuvant radiation and start adjuvant antiestrogen therapy.  We once again today discussed about tamoxifen versus unpleasantness, can interactions with each class.  We have discussed aspects of tamoxifen including but not postmenopausal symptoms, increased risk of QT/PE, endometrial cancer, benefit on bone density.  She is leaning towards tamoxifen since she osteoporosis and has underlying history of osteopenia.  She understands aromatase inhibitors may prevent bone loss.  She understands completed tamoxifen tamoxifen prescription has been dispensed to the pharmacy of her choice.  She will start this during first week of February.  She will return to clinic in 3 weeks for Herceptin every 6 weeks for follow-up.  Repeat echo has been ordered.

## 2022-01-22 ENCOUNTER — Ambulatory Visit: Payer: No Typology Code available for payment source

## 2022-01-22 ENCOUNTER — Other Ambulatory Visit: Payer: No Typology Code available for payment source

## 2022-01-22 ENCOUNTER — Ambulatory Visit: Payer: No Typology Code available for payment source | Admitting: Hematology and Oncology

## 2022-01-22 DIAGNOSIS — Z510 Encounter for antineoplastic radiation therapy: Secondary | ICD-10-CM | POA: Diagnosis not present

## 2022-01-23 ENCOUNTER — Ambulatory Visit: Payer: No Typology Code available for payment source

## 2022-01-24 ENCOUNTER — Ambulatory Visit
Admission: RE | Admit: 2022-01-24 | Discharge: 2022-01-24 | Disposition: A | Discharge status: Home / Self Care | Payer: No Typology Code available for payment source | Source: Ambulatory Visit | Attending: Radiation Oncology | Admitting: Radiation Oncology

## 2022-01-24 ENCOUNTER — Other Ambulatory Visit: Payer: Self-pay

## 2022-01-24 ENCOUNTER — Ambulatory Visit: Payer: No Typology Code available for payment source

## 2022-01-24 DIAGNOSIS — Z510 Encounter for antineoplastic radiation therapy: Secondary | ICD-10-CM | POA: Diagnosis not present

## 2022-01-24 LAB — RAD ONC ARIA SESSION SUMMARY
Course Elapsed Days: 16
Course Elapsed Days: 16
Plan Fractions Treated to Date: 12
Plan Fractions Treated to Date: 13
Plan Prescribed Dose Per Fraction: 2.66 Gy
Plan Prescribed Dose Per Fraction: 2.66 Gy
Plan Total Fractions Prescribed: 16
Plan Total Fractions Prescribed: 16
Plan Total Prescribed Dose: 42.56 Gy
Plan Total Prescribed Dose: 42.56 Gy
Reference Point Dosage Given to Date: 31.92 Gy
Reference Point Dosage Given to Date: 34.58 Gy
Reference Point Session Dosage Given: 2.66 Gy
Reference Point Session Dosage Given: 2.66 Gy
Session Number: 12
Session Number: 13

## 2022-01-28 ENCOUNTER — Other Ambulatory Visit: Payer: Self-pay

## 2022-01-28 ENCOUNTER — Ambulatory Visit
Admission: RE | Admit: 2022-01-28 | Discharge: 2022-01-28 | Disposition: A | Discharge status: Home / Self Care | Payer: No Typology Code available for payment source | Source: Ambulatory Visit | Attending: Radiation Oncology | Admitting: Radiation Oncology

## 2022-01-28 DIAGNOSIS — Z510 Encounter for antineoplastic radiation therapy: Secondary | ICD-10-CM | POA: Diagnosis not present

## 2022-01-28 LAB — RAD ONC ARIA SESSION SUMMARY
Course Elapsed Days: 20
Plan Fractions Treated to Date: 14
Plan Prescribed Dose Per Fraction: 2.66 Gy
Plan Total Fractions Prescribed: 16
Plan Total Prescribed Dose: 42.56 Gy
Reference Point Dosage Given to Date: 37.24 Gy
Reference Point Session Dosage Given: 2.66 Gy
Session Number: 14

## 2022-01-29 ENCOUNTER — Other Ambulatory Visit: Payer: Self-pay

## 2022-01-29 ENCOUNTER — Ambulatory Visit
Admission: RE | Admit: 2022-01-29 | Discharge: 2022-01-29 | Disposition: A | Discharge status: Home / Self Care | Payer: No Typology Code available for payment source | Source: Ambulatory Visit | Attending: Radiation Oncology | Admitting: Radiation Oncology

## 2022-01-29 DIAGNOSIS — Z510 Encounter for antineoplastic radiation therapy: Secondary | ICD-10-CM | POA: Diagnosis not present

## 2022-01-29 LAB — RAD ONC ARIA SESSION SUMMARY
Course Elapsed Days: 21
Plan Fractions Treated to Date: 15
Plan Prescribed Dose Per Fraction: 2.66 Gy
Plan Total Fractions Prescribed: 16
Plan Total Prescribed Dose: 42.56 Gy
Reference Point Dosage Given to Date: 39.9 Gy
Reference Point Session Dosage Given: 2.66 Gy
Session Number: 15

## 2022-01-30 ENCOUNTER — Ambulatory Visit
Admission: RE | Admit: 2022-01-30 | Discharge: 2022-01-30 | Disposition: A | Discharge status: Home / Self Care | Payer: No Typology Code available for payment source | Source: Ambulatory Visit | Attending: Radiation Oncology | Admitting: Radiation Oncology

## 2022-01-30 ENCOUNTER — Other Ambulatory Visit: Payer: Self-pay

## 2022-01-30 ENCOUNTER — Encounter: Payer: Self-pay | Admitting: Hematology and Oncology

## 2022-01-30 DIAGNOSIS — Z510 Encounter for antineoplastic radiation therapy: Secondary | ICD-10-CM | POA: Diagnosis not present

## 2022-01-30 LAB — RAD ONC ARIA SESSION SUMMARY
Course Elapsed Days: 22
Plan Fractions Treated to Date: 16
Plan Prescribed Dose Per Fraction: 2.66 Gy
Plan Total Fractions Prescribed: 16
Plan Total Prescribed Dose: 42.56 Gy
Reference Point Dosage Given to Date: 42.56 Gy
Reference Point Session Dosage Given: 2.66 Gy
Session Number: 16

## 2022-01-31 ENCOUNTER — Ambulatory Visit: Payer: No Typology Code available for payment source

## 2022-01-31 ENCOUNTER — Ambulatory Visit
Admission: RE | Admit: 2022-01-31 | Discharge: 2022-01-31 | Disposition: A | Discharge status: Home / Self Care | Payer: No Typology Code available for payment source | Source: Ambulatory Visit | Attending: Radiation Oncology | Admitting: Radiation Oncology

## 2022-01-31 ENCOUNTER — Encounter: Payer: Self-pay | Admitting: Hematology and Oncology

## 2022-01-31 ENCOUNTER — Other Ambulatory Visit: Payer: Self-pay

## 2022-01-31 DIAGNOSIS — Z510 Encounter for antineoplastic radiation therapy: Secondary | ICD-10-CM | POA: Diagnosis not present

## 2022-01-31 LAB — RAD ONC ARIA SESSION SUMMARY
Course Elapsed Days: 23
Plan Fractions Treated to Date: 1
Plan Prescribed Dose Per Fraction: 2 Gy
Plan Total Fractions Prescribed: 4
Plan Total Prescribed Dose: 8 Gy
Reference Point Dosage Given to Date: 2 Gy
Reference Point Session Dosage Given: 2 Gy
Session Number: 17

## 2022-02-01 ENCOUNTER — Other Ambulatory Visit (HOSPITAL_BASED_OUTPATIENT_CLINIC_OR_DEPARTMENT_OTHER): Payer: Self-pay | Admitting: Cardiology

## 2022-02-01 DIAGNOSIS — I10 Essential (primary) hypertension: Secondary | ICD-10-CM

## 2022-02-04 ENCOUNTER — Ambulatory Visit
Admission: RE | Admit: 2022-02-04 | Discharge: 2022-02-04 | Disposition: A | Discharge status: Home / Self Care | Payer: No Typology Code available for payment source | Source: Ambulatory Visit | Attending: Radiation Oncology | Admitting: Radiation Oncology

## 2022-02-04 ENCOUNTER — Other Ambulatory Visit: Payer: Self-pay

## 2022-02-04 ENCOUNTER — Ambulatory Visit: Payer: No Typology Code available for payment source

## 2022-02-04 DIAGNOSIS — C50412 Malignant neoplasm of upper-outer quadrant of left female breast: Secondary | ICD-10-CM | POA: Diagnosis not present

## 2022-02-04 DIAGNOSIS — Z170 Estrogen receptor positive status [ER+]: Secondary | ICD-10-CM | POA: Diagnosis not present

## 2022-02-04 DIAGNOSIS — Z510 Encounter for antineoplastic radiation therapy: Secondary | ICD-10-CM | POA: Diagnosis not present

## 2022-02-04 LAB — RAD ONC ARIA SESSION SUMMARY
Course Elapsed Days: 27
Plan Fractions Treated to Date: 2
Plan Prescribed Dose Per Fraction: 2 Gy
Plan Total Fractions Prescribed: 4
Plan Total Prescribed Dose: 8 Gy
Reference Point Dosage Given to Date: 4 Gy
Reference Point Session Dosage Given: 2 Gy
Session Number: 18

## 2022-02-04 NOTE — Telephone Encounter (Signed)
Rx request sent to pharmacy.  

## 2022-02-05 ENCOUNTER — Other Ambulatory Visit: Payer: Self-pay

## 2022-02-05 ENCOUNTER — Ambulatory Visit (HOSPITAL_COMMUNITY)
Admission: RE | Admit: 2022-02-05 | Discharge: 2022-02-05 | Disposition: A | Discharge status: Home / Self Care | Payer: No Typology Code available for payment source | Source: Ambulatory Visit | Attending: Hematology and Oncology | Admitting: Hematology and Oncology

## 2022-02-05 ENCOUNTER — Ambulatory Visit: Payer: No Typology Code available for payment source

## 2022-02-05 ENCOUNTER — Encounter: Payer: Self-pay | Admitting: Hematology and Oncology

## 2022-02-05 ENCOUNTER — Ambulatory Visit
Admission: RE | Admit: 2022-02-05 | Discharge: 2022-02-05 | Disposition: A | Discharge status: Home / Self Care | Payer: No Typology Code available for payment source | Source: Ambulatory Visit | Attending: Radiation Oncology | Admitting: Radiation Oncology

## 2022-02-05 DIAGNOSIS — I10 Essential (primary) hypertension: Secondary | ICD-10-CM | POA: Insufficient documentation

## 2022-02-05 DIAGNOSIS — K219 Gastro-esophageal reflux disease without esophagitis: Secondary | ICD-10-CM | POA: Insufficient documentation

## 2022-02-05 DIAGNOSIS — Z0189 Encounter for other specified special examinations: Secondary | ICD-10-CM

## 2022-02-05 DIAGNOSIS — C50412 Malignant neoplasm of upper-outer quadrant of left female breast: Secondary | ICD-10-CM | POA: Insufficient documentation

## 2022-02-05 DIAGNOSIS — Z170 Estrogen receptor positive status [ER+]: Secondary | ICD-10-CM | POA: Diagnosis not present

## 2022-02-05 LAB — RAD ONC ARIA SESSION SUMMARY
Course Elapsed Days: 28
Plan Fractions Treated to Date: 3
Plan Prescribed Dose Per Fraction: 2 Gy
Plan Total Fractions Prescribed: 4
Plan Total Prescribed Dose: 8 Gy
Reference Point Dosage Given to Date: 6 Gy
Reference Point Session Dosage Given: 2 Gy
Session Number: 19

## 2022-02-05 LAB — ECHOCARDIOGRAM COMPLETE
Area-P 1/2: 3.08 cm2
Calc EF: 72 %
S' Lateral: 2.3 cm
Single Plane A2C EF: 68.8 %
Single Plane A4C EF: 73.1 %

## 2022-02-05 NOTE — Progress Notes (Signed)
Echocardiogram 2D Echocardiogram has been performed.  [name] 02/05/2022, 12:03 PM

## 2022-02-06 ENCOUNTER — Ambulatory Visit
Admission: RE | Admit: 2022-02-06 | Discharge: 2022-02-06 | Disposition: A | Discharge status: Home / Self Care | Payer: No Typology Code available for payment source | Source: Ambulatory Visit | Attending: Radiation Oncology | Admitting: Radiation Oncology

## 2022-02-06 ENCOUNTER — Ambulatory Visit: Payer: No Typology Code available for payment source

## 2022-02-06 ENCOUNTER — Other Ambulatory Visit: Payer: Self-pay

## 2022-02-06 ENCOUNTER — Encounter: Payer: Self-pay | Admitting: *Deleted

## 2022-02-06 ENCOUNTER — Encounter: Payer: Self-pay | Admitting: Radiation Oncology

## 2022-02-06 DIAGNOSIS — Z510 Encounter for antineoplastic radiation therapy: Secondary | ICD-10-CM | POA: Diagnosis not present

## 2022-02-06 LAB — RAD ONC ARIA SESSION SUMMARY
Course Elapsed Days: 29
Plan Fractions Treated to Date: 4
Plan Prescribed Dose Per Fraction: 2 Gy
Plan Total Fractions Prescribed: 4
Plan Total Prescribed Dose: 8 Gy
Reference Point Dosage Given to Date: 8 Gy
Reference Point Session Dosage Given: 2 Gy
Session Number: 20

## 2022-02-07 ENCOUNTER — Ambulatory Visit: Payer: No Typology Code available for payment source

## 2022-02-11 ENCOUNTER — Encounter: Payer: Self-pay | Admitting: Hematology and Oncology

## 2022-02-11 NOTE — Progress Notes (Signed)
                                                                                                                                                             Patient Name: Christine [name] MRN: [id number] DOB: 1960-06-15 Referring Physician: [name] (Profile Not Attached) Date of Service: 02/06/2022 [facility], [place]                                                        End Of Treatment Note  Diagnoses: C50.412-Malignant neoplasm of upper-outer quadrant of left female breast Z17.0-Estrogen receptor positive status [ER+]  Cancer Staging: Stage IA, pT1cN0M0, grade 3, ER positive, HER2 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 No complaints of skin irritation or fatigue were noted at the completion of therapy.  Plan: The patient will receive a call in about one month from the radiation oncology department. She will continue follow up with Dr. Chryl Heck as well.   ________________________________________________    Carola Rhine, [facility]

## 2022-02-13 ENCOUNTER — Inpatient Hospital Stay: Payer: No Typology Code available for payment source | Attending: Hematology and Oncology

## 2022-02-13 VITALS — BP 123/58 | HR 67 | Temp 98.0°F | Resp 17 | Wt 137.2 lb

## 2022-02-13 DIAGNOSIS — Z170 Estrogen receptor positive status [ER+]: Secondary | ICD-10-CM | POA: Insufficient documentation

## 2022-02-13 DIAGNOSIS — Z5112 Encounter for antineoplastic immunotherapy: Secondary | ICD-10-CM | POA: Diagnosis present

## 2022-02-13 DIAGNOSIS — C50412 Malignant neoplasm of upper-outer quadrant of left female breast: Secondary | ICD-10-CM | POA: Insufficient documentation

## 2022-02-13 DIAGNOSIS — Z79810 Long term (current) use of selective estrogen receptor modulators (SERMs): Secondary | ICD-10-CM | POA: Diagnosis not present

## 2022-02-13 MED ORDER — HEPARIN SOD (PORK) LOCK FLUSH 100 UNIT/ML IV SOLN
500.0000 [IU] | Freq: Once | INTRAVENOUS | Status: AC | PRN
  Administered 2022-02-13: 500 [IU]

## 2022-02-13 MED ORDER — SODIUM CHLORIDE 0.9 % IV SOLN: Freq: Once | INTRAVENOUS | Status: AC

## 2022-02-13 MED ORDER — TRASTUZUMAB-DKST CHEMO 150 MG IV SOLR
378.0000 mg | Freq: Once | INTRAVENOUS | Status: AC
  Administered 2022-02-13: 378 mg via INTRAVENOUS
  Filled 2022-02-13: qty 18

## 2022-02-13 MED ORDER — ACETAMINOPHEN 325 MG PO TABS
650.0000 mg | ORAL_TABLET | Freq: Once | ORAL | Status: AC
  Administered 2022-02-13: 650 mg via ORAL
  Filled 2022-02-13: qty 2

## 2022-02-13 MED ORDER — SODIUM CHLORIDE 0.9% FLUSH
10.0000 mL | INTRAVENOUS | Status: DC | PRN
  Administered 2022-02-13: 10 mL

## 2022-02-13 MED ORDER — DIPHENHYDRAMINE HCL 25 MG PO CAPS
25.0000 mg | ORAL_CAPSULE | Freq: Once | ORAL | Status: AC
  Administered 2022-02-13: 25 mg via ORAL
  Filled 2022-02-13: qty 1

## 2022-02-13 NOTE — Patient Instructions (Signed)
[facility]  Discharge Instructions: Thank you for choosing [facility] to provide your oncology and hematology care.   If you have a lab appointment with the Cancer Center, please go directly to the Cancer Center and check in at the registration area.   Wear comfortable clothing and clothing appropriate for easy access to any Portacath or PICC line.   We strive to give you quality time with your provider. You may need to reschedule your appointment if you arrive late (15 or more minutes).  Arriving late affects you and other patients whose appointments are after yours.  Also, if you miss three or more appointments without notifying the office, you may be dismissed from the clinic at the provider's discretion.      For prescription refill requests, have your pharmacy contact our office and allow 72 hours for refills to be completed.    Today you received the following chemotherapy and/or immunotherapy agents: Ogivri      To help prevent nausea and vomiting after your treatment, we encourage you to take your nausea medication as directed.  BELOW ARE SYMPTOMS THAT SHOULD BE REPORTED IMMEDIATELY: *FEVER GREATER THAN 100.4 F (38 C) OR HIGHER *CHILLS OR SWEATING *NAUSEA AND VOMITING THAT IS NOT CONTROLLED WITH YOUR NAUSEA MEDICATION *UNUSUAL SHORTNESS OF BREATH *UNUSUAL BRUISING OR BLEEDING *URINARY PROBLEMS (pain or burning when urinating, or frequent urination) *BOWEL PROBLEMS (unusual diarrhea, constipation, pain near the anus) TENDERNESS IN MOUTH AND THROAT WITH OR WITHOUT PRESENCE OF ULCERS (sore throat, sores in mouth, or a toothache) UNUSUAL RASH, SWELLING OR PAIN  UNUSUAL VAGINAL DISCHARGE OR ITCHING   Items with * indicate a potential emergency and should be followed up as soon as possible or go to the Emergency Department if any problems should occur.  Please show the CHEMOTHERAPY ALERT CARD or IMMUNOTHERAPY ALERT CARD at check-in to the  Emergency Department and triage nurse.  Should you have questions after your visit or need to cancel or reschedule your appointment, please contact [facility]  Dept: [phone]  and follow the prompts.  Office hours are 8:00 a.m. to 4:30 p.m. Monday - Friday. Please note that voicemails left after 4:00 p.m. may not be returned until the following business day.  We are closed weekends and major holidays. You have access to a nurse at all times for urgent questions. Please call the main number to the clinic Dept: [phone] and follow the prompts.   For any non-urgent questions, you may also contact your provider using MyChart. We now offer e-Visits for anyone 18 and older to request care online for non-urgent symptoms. For details visit [email or web address].   Also download the MyChart app! Go to the app store, search "MyChart", open the app, select [facility], and log in with your MyChart username and password.  Masks are optional in the cancer centers. If you would like for your care team to wear a mask while they are taking care of you, please let them know. You may have one support Christine [name] who is at least 62 years old accompany you for your appointments. 

## 2022-02-17 ENCOUNTER — Encounter: Payer: Self-pay | Admitting: Hematology and Oncology

## 2022-02-24 ENCOUNTER — Encounter: Payer: Self-pay | Admitting: Hematology and Oncology

## 2022-02-24 ENCOUNTER — Ambulatory Visit: Payer: No Typology Code available for payment source | Attending: General Surgery

## 2022-02-24 VITALS — Wt 136.5 lb

## 2022-02-24 DIAGNOSIS — Z483 Aftercare following surgery for neoplasm: Secondary | ICD-10-CM | POA: Insufficient documentation

## 2022-02-24 NOTE — Therapy (Signed)
OUTPATIENT PHYSICAL THERAPY [facility] SCREENING NOTE   Patient Name: Christine [name] MRN: [id number] DOB:04/15/1960, 62 y.o., female Today's Date: 02/24/2022  PCP: [name], MD REFERRING PROVIDER: Jovita Kussmaul, MD   PT End of Session - 02/24/22 [phone]     Visit Number 3   # unchnaged due to screen only   PT Start Time 0920    PT Stop Time 0929    PT Time Calculation (min) 9 min    Activity Tolerance Patient tolerated treatment well    Behavior During Therapy [facility] for tasks assessed/performed             Past Medical History:  Diagnosis Date   Family history of breast cancer 07/31/2021   GERD (gastroesophageal reflux disease)    Hypertension    Past Surgical History:  Procedure Laterality Date   BREAST LUMPECTOMY WITH RADIOACTIVE SEED AND SENTINEL LYMPH NODE BIOPSY Left 08/09/2021   Procedure: LEFT BREAST RADIOACTIVE SEED LOCALIZED LUMPECTOMY AND SENTINEL NODE BIOPSY;  Surgeon: Jovita Kussmaul, MD;  Location: [place];  Service: General;  Laterality: Left;  GEN & PEC BLOCK   CESAREAN SECTION     FOOT SURGERY Right    PORTACATH PLACEMENT Right 08/09/2021   Procedure: PORT PLACEMENT RIGHT  INTERNAL JUGULAR  WITH ULTRASOUND GUIDANCE;  Surgeon: Jovita Kussmaul, MD;  Location: [place];  Service: General;  Laterality: Right;  RIGHT INTERNAL JUGULAR PLACEMENT   Patient Active Problem List   Diagnosis Date Noted   Antineoplastic chemotherapy induced anemia 10/18/2021   Port-A-Cath in place 09/12/2021   Iron deficiency anemia 09/02/2021   Genetic testing 08/05/2021   Family history of breast cancer 07/31/2021   Atherosclerotic heart disease 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 07/31/2021   Easy bruising 07/31/2021   Essential hypertension 07/31/2021   Hearing loss in left ear 07/31/2021   Insomnia 07/31/2021   Mixed hyperlipidemia 07/31/2021   Other long term (current) drug therapy 07/31/2021   Sleep disorder 07/31/2021   Tendency toward bleeding easily ([place])  07/31/2021   Vitamin D deficiency 07/31/2021   Malignant neoplasm of upper-outer quadrant of left breast in female, estrogen receptor positive ([place]) 07/25/2021   Abnormal finding on mammography 07/10/2021   GERD (gastroesophageal reflux disease) 08/31/2019   Globus pharyngeus 08/02/2019   Rhinitis, chronic 08/02/2019   Elevated blood pressure reading 01/26/2019   Acute pain of right knee 10/26/2017    REFERRING DIAG: left breast cancer at risk for lymphedema  THERAPY DIAG: Aftercare following surgery for neoplasm  PERTINENT HISTORY: Patient was diagnosed on 07/08/2021 with left grade 3 invasive ductal carcinoma breast cancer. It measures 7 mm and is located in the upper outer quadrant. It is ER positive, PR negative, and HER2 positive with a Ki67 of 35%. Left lumpectomy and SLNB on 08/09/21 with port placement and 3 negative lymph nodes. Underwent chemotherapy with taxol and herceptin.   PRECAUTIONS: left UE Lymphedema risk  SUBJECTIVE: Patient requested another SOZO screen (last one was 11/11/2021) because her massage therapist notice swelling in her axillary region.  PAIN:  Are you having pain? No  LYMPHEDEMA ASSESSMENTS:    Patient was assessed today using the SOZO machine to determine the lymphedema index score. This was compared to her baseline score. It was determined that she is within the recommended range when compared to her baseline and no further action is needed at this time. She will continue SOZO screenings. These are done every 3 months for 2 years post operatively followed by  every 6 months for 2 years, and then annually.   Issued script as pt would like to get a compression sleeve at this time.      L-DEX FLOWSHEETS - 02/24/22 0900       L-DEX LYMPHEDEMA SCREENING   Measurement Type Unilateral    L-DEX MEASUREMENT EXTREMITY Upper Extremity    POSITION  Standing    DOMINANT SIDE Right    At Risk Side Left    BASELINE SCORE (UNILATERAL) -2.1    L-DEX SCORE  (UNILATERAL) 3.7    VALUE CHANGE (UNILAT) 5.8                Collie Siad, PTA 02/24/22 9:31 AM

## 2022-03-06 ENCOUNTER — Ambulatory Visit (INDEPENDENT_AMBULATORY_CARE_PROVIDER_SITE_OTHER): Payer: No Typology Code available for payment source | Admitting: Cardiology

## 2022-03-06 ENCOUNTER — Encounter (HOSPITAL_BASED_OUTPATIENT_CLINIC_OR_DEPARTMENT_OTHER): Payer: Self-pay | Admitting: Cardiology

## 2022-03-06 ENCOUNTER — Inpatient Hospital Stay (HOSPITAL_BASED_OUTPATIENT_CLINIC_OR_DEPARTMENT_OTHER): Payer: No Typology Code available for payment source | Admitting: Adult Health

## 2022-03-06 ENCOUNTER — Inpatient Hospital Stay: Payer: No Typology Code available for payment source | Attending: Hematology and Oncology

## 2022-03-06 ENCOUNTER — Encounter: Payer: Self-pay | Admitting: Hematology and Oncology

## 2022-03-06 ENCOUNTER — Encounter: Payer: Self-pay | Admitting: Adult Health

## 2022-03-06 ENCOUNTER — Inpatient Hospital Stay: Payer: No Typology Code available for payment source

## 2022-03-06 VITALS — BP 110/76 | HR 52 | Ht 64.0 in | Wt 138.7 lb

## 2022-03-06 VITALS — BP 107/61 | HR 59 | Temp 97.9°F | Resp 16

## 2022-03-06 VITALS — BP 122/63 | HR 64 | Temp 97.5°F | Resp 18 | Ht 64.0 in | Wt 136.1 lb

## 2022-03-06 DIAGNOSIS — D6481 Anemia due to antineoplastic chemotherapy: Secondary | ICD-10-CM | POA: Insufficient documentation

## 2022-03-06 DIAGNOSIS — I10 Essential (primary) hypertension: Secondary | ICD-10-CM | POA: Insufficient documentation

## 2022-03-06 DIAGNOSIS — Z170 Estrogen receptor positive status [ER+]: Secondary | ICD-10-CM | POA: Diagnosis not present

## 2022-03-06 DIAGNOSIS — Z7189 Other specified counseling: Secondary | ICD-10-CM | POA: Diagnosis not present

## 2022-03-06 DIAGNOSIS — E785 Hyperlipidemia, unspecified: Secondary | ICD-10-CM | POA: Insufficient documentation

## 2022-03-06 DIAGNOSIS — D509 Iron deficiency anemia, unspecified: Secondary | ICD-10-CM | POA: Insufficient documentation

## 2022-03-06 DIAGNOSIS — K219 Gastro-esophageal reflux disease without esophagitis: Secondary | ICD-10-CM | POA: Insufficient documentation

## 2022-03-06 DIAGNOSIS — G4700 Insomnia, unspecified: Secondary | ICD-10-CM | POA: Diagnosis not present

## 2022-03-06 DIAGNOSIS — Z803 Family history of malignant neoplasm of breast: Secondary | ICD-10-CM | POA: Diagnosis not present

## 2022-03-06 DIAGNOSIS — E559 Vitamin D deficiency, unspecified: Secondary | ICD-10-CM | POA: Diagnosis not present

## 2022-03-06 DIAGNOSIS — I251 Atherosclerotic heart disease 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: Secondary | ICD-10-CM | POA: Diagnosis not present

## 2022-03-06 DIAGNOSIS — C50412 Malignant neoplasm of upper-outer quadrant of left female breast: Secondary | ICD-10-CM

## 2022-03-06 DIAGNOSIS — Z801 Family history of malignant neoplasm of trachea, bronchus and lung: Secondary | ICD-10-CM | POA: Diagnosis not present

## 2022-03-06 DIAGNOSIS — E7800 Pure hypercholesterolemia, unspecified: Secondary | ICD-10-CM

## 2022-03-06 DIAGNOSIS — Z5112 Encounter for antineoplastic immunotherapy: Secondary | ICD-10-CM | POA: Insufficient documentation

## 2022-03-06 DIAGNOSIS — I2584 Coronary atherosclerosis due to calcified coronary lesion: Secondary | ICD-10-CM

## 2022-03-06 DIAGNOSIS — Z95828 Presence of other vascular implants and grafts: Secondary | ICD-10-CM

## 2022-03-06 LAB — CMP (CANCER CENTER ONLY)
ALT: 36 U/L (ref 0–44)
AST: 30 U/L (ref 15–41)
Albumin: 4.1 g/dL (ref 3.5–5.0)
Alkaline Phosphatase: 50 U/L (ref 38–126)
Anion gap: 5 (ref 5–15)
BUN: 17 mg/dL (ref 8–23)
CO2: 28 mmol/L (ref 22–32)
Calcium: 9.2 mg/dL (ref 8.9–10.3)
Chloride: 105 mmol/L (ref 98–111)
Creatinine: 0.62 mg/dL (ref 0.44–1.00)
GFR, Estimated: >60 mL/min (ref >60)
Glucose, Bld: 97 mg/dL (ref 70–99)
Potassium: 3.8 mmol/L (ref 3.5–5.1)
Sodium: 138 mmol/L (ref 135–145)
Total Bilirubin: 0.9 mg/dL (ref 0.3–1.2)
Total Protein: 6.8 g/dL (ref 6.5–8.1)

## 2022-03-06 LAB — CBC WITH DIFFERENTIAL (CANCER CENTER ONLY)
Abs Immature Granulocytes: 0.01 10*3/uL (ref 0.00–0.07)
Basophils Absolute: 0 10*3/uL (ref 0.0–0.1)
Basophils Relative: 1 %
Eosinophils Absolute: 0.1 10*3/uL (ref 0.0–0.5)
Eosinophils Relative: 3 %
HCT: 34 % — ABNORMAL LOW (ref 36.0–46.0)
Hemoglobin: 11.5 g/dL — ABNORMAL LOW (ref 12.0–15.0)
Immature Granulocytes: 0 %
Lymphocytes Relative: 27 %
Lymphs Abs: 1.2 10*3/uL (ref 0.7–4.0)
MCH: 29.5 pg (ref 26.0–34.0)
MCHC: 33.8 g/dL (ref 30.0–36.0)
MCV: 87.2 fL (ref 80.0–100.0)
Monocytes Absolute: 0.3 10*3/uL (ref 0.1–1.0)
Monocytes Relative: 7 %
Neutro Abs: 2.8 10*3/uL (ref 1.7–7.7)
Neutrophils Relative %: 62 %
Platelet Count: 221 10*3/uL (ref 150–400)
RBC: 3.9 MIL/uL (ref 3.87–5.11)
RDW: 12.1 % (ref 11.5–15.5)
WBC Count: 4.5 10*3/uL (ref 4.0–10.5)
nRBC: 0 % (ref 0.0–0.2)

## 2022-03-06 MED ORDER — HEPARIN SOD (PORK) LOCK FLUSH 100 UNIT/ML IV SOLN
500.0000 [IU] | Freq: Once | INTRAVENOUS | Status: AC | PRN
  Administered 2022-03-06: 500 [IU]

## 2022-03-06 MED ORDER — SODIUM CHLORIDE 0.9 % IV SOLN: Freq: Once | INTRAVENOUS | Status: AC

## 2022-03-06 MED ORDER — TRASTUZUMAB-DKST CHEMO 150 MG IV SOLR
378.0000 mg | Freq: Once | INTRAVENOUS | Status: AC
  Administered 2022-03-06: 378 mg via INTRAVENOUS
  Filled 2022-03-06: qty 18

## 2022-03-06 MED ORDER — SODIUM CHLORIDE 0.9% FLUSH
10.0000 mL | Freq: Once | INTRAVENOUS | Status: AC
  Administered 2022-03-06: 10 mL

## 2022-03-06 MED ORDER — DIPHENHYDRAMINE HCL 25 MG PO CAPS
25.0000 mg | ORAL_CAPSULE | Freq: Once | ORAL | Status: AC
  Administered 2022-03-06: 25 mg via ORAL
  Filled 2022-03-06: qty 1

## 2022-03-06 MED ORDER — SODIUM CHLORIDE 0.9% FLUSH
10.0000 mL | INTRAVENOUS | Status: DC | PRN
  Administered 2022-03-06: 10 mL

## 2022-03-06 MED ORDER — ACETAMINOPHEN 325 MG PO TABS
650.0000 mg | ORAL_TABLET | Freq: Once | ORAL | Status: AC
  Administered 2022-03-06: 650 mg via ORAL
  Filled 2022-03-06: qty 2

## 2022-03-06 NOTE — Progress Notes (Signed)
[facility] Follow up:    Christine Huddle, MD [address] [place] [address]   DIAGNOSIS:  Cancer Staging  Malignant neoplasm of upper-outer quadrant of left breast in female, estrogen receptor positive ([place]) Staging form: Breast, AJCC 8th Edition - Clinical stage from 07/29/2021: Stage IA (cT1c, cN0, cM0, G3, ER+, PR-, HER2+) - Signed by Hayden Pedro, PA-C on 07/31/2021 Stage prefix: Initial diagnosis Method of lymph node assessment: Clinical Histologic grading system: 3 grade system   SUMMARY OF ONCOLOGIC HISTORY: Oncology History  Malignant neoplasm of upper-outer quadrant of left breast in female, estrogen receptor positive ([place])  07/17/2021 Mammogram   Suspicious left breast mass at 3 o'clock, 4 cm from the nipple. No axillary adenopathy. Targeted ultrasound is performed, showing an irregular centrally hypoechoic mass with an echogenic rim measuring 7 x 5 by 7 mm, thought to correlate with the mammographically identified mass. This mass is located at 3 o'clock, 4 cm from the nipple. No axillary adenopathy.   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female, estrogen receptor positive ([name])   07/29/2021 Cancer Staging   Staging form: Breast, AJCC 8th Edition - Clinical stage from 07/29/2021: Stage IA (cT1c, cN0, cM0, G3, ER+, PR-, HER2+) - Signed by Hayden Pedro, PA-C on 07/31/2021 Stage prefix: Initial diagnosis Method of lymph node assessment: Clinical Histologic grading system: 3 grade system    Pathology Results   Path showed IDC, grade 3, ER 100% positive, strong staining intensity, PR negative, ki 67 35%, Her 2 positive   08/05/2021 Genetic Testing   Negative hereditary cancer genetic testing: no pathogenic variants detected in [facility] BRCAPlus Panel and [facility] CustomNext-Cancer +RNAinsight Panel.  Report dates are August 05, 2021 and August 08, 2021.    The BRCAplus panel  offered by [employer] and includes sequencing and deletion/duplication analysis for the following 8 genes: ATM, BRCA1, BRCA2, CDH1, CHEK2, PALB2, PTEN, and TP53. The CustomNext-Cancer+RNAinsight panel offered by [facility] includes sequencing and rearrangement analysis for the following 47 genes:  APC, ATM, AXIN2, BARD1, BMPR1A, BRCA1, BRCA2, BRIP1, CDH1, CDK4, CDKN2A, CHEK2, DICER1, EPCAM, GREM1, HOXB13, MEN1, MLH1, MSH2, MSH3, MSH6, MUTYH, NBN, NF1, NF2, NTHL1, PALB2, PMS2, POLD1, POLE, PTEN, RAD51C, RAD51D, RECQL, RET, SDHA, SDHAF2, SDHB, SDHC, SDHD, SMAD4, SMARCA4, STK11, TP53, TSC1, TSC2, and VHL.  RNA data is routinely analyzed for use in variant interpretation for all genes.   08/09/2021 Surgery   Left lumpectomy: IDC, grade 3, 3 sentinel lymph nodes all negative for cancer.  He has biomarker testing ER positive, PR negative, HER2 positive.  pT1c, PN0.   09/12/2021 -  Adjuvant Chemotherapy   Taxol/Herceptin weekly x12, followed by Herceptin given every 3 weeks to complete 1 year of treatment.     CURRENT THERAPY: Herceptin; Tamoxifen daily  INTERVAL HISTORY: Christine [name] 62 y.o. female returns for evaluation prior to receiving Herceptin therapy.  Her most recent echocardiogram occurred on February 05, 2022 demonstrating a left ventricular ejection fraction of 65 to 70% and a normal global longitudinal strain.  She is starting tamoxifen daily today.  She understands the risks and benefits.  He tells me that she completed adjuvant radiation and healed well.  Patient Active Problem List   Diagnosis Date Noted   Antineoplastic chemotherapy induced anemia 10/18/2021   Port-A-Cath in place 09/12/2021   Iron deficiency anemia 09/02/2021   Genetic testing 08/05/2021   Family history of breast cancer 07/31/2021  Atherosclerotic heart disease 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 07/31/2021   Easy bruising 07/31/2021   Essential hypertension 07/31/2021   Hearing loss in  left ear 07/31/2021   Insomnia 07/31/2021   Mixed hyperlipidemia 07/31/2021   Other long term (current) drug therapy 07/31/2021   Sleep disorder 07/31/2021   Tendency toward bleeding easily ([place]) 07/31/2021   Vitamin D deficiency 07/31/2021   Malignant neoplasm of upper-outer quadrant of left breast in female, estrogen receptor positive ([place]) 07/25/2021   Abnormal finding on mammography 07/10/2021   GERD (gastroesophageal reflux disease) 08/31/2019   Globus pharyngeus 08/02/2019   Rhinitis, chronic 08/02/2019   Elevated blood pressure reading 01/26/2019   Acute pain of right knee 10/26/2017    is allergic to lisinopril and mefloquine.  MEDICAL HISTORY: Past Medical History:  Diagnosis Date   Family history of breast cancer 07/31/2021   GERD (gastroesophageal reflux disease)    Hypertension     SURGICAL HISTORY: Past Surgical History:  Procedure Laterality Date   BREAST LUMPECTOMY WITH RADIOACTIVE SEED AND SENTINEL LYMPH NODE BIOPSY Left 08/09/2021   Procedure: LEFT BREAST RADIOACTIVE SEED LOCALIZED LUMPECTOMY AND SENTINEL NODE BIOPSY;  Surgeon: Jovita Kussmaul, MD;  Location: [place];  Service: General;  Laterality: Left;  GEN & PEC BLOCK   CESAREAN SECTION     FOOT SURGERY Right    PORTACATH PLACEMENT Right 08/09/2021   Procedure: PORT PLACEMENT RIGHT  INTERNAL JUGULAR  WITH ULTRASOUND GUIDANCE;  Surgeon: Jovita Kussmaul, MD;  Location[facility];  Service: General;  Laterality: Right;  RIGHT INTERNAL JUGULAR PLACEMENT    SOCIAL HISTORY: Social History   Socioeconomic History   Marital status: Married    Spouse name: Not on file   Number of children: Not on file   Years of education: Not on file   Highest education level: Not on file  Occupational History   Not on file  Tobacco Use   Smoking status: Never   Smokeless tobacco: Never  Vaping Use   Vaping Use: Never used  Substance and Sexual Activity   Alcohol use: Yes    Alcohol/week: 2.0 standard drinks of alcohol     Types: 2 Glasses of wine per week   Drug use: Never   Sexual activity: Not on file  Other Topics Concern   Not on file  Social History Narrative   Not on file   Social Determinants of Health   Financial Resource Strain: Not on file  Food Insecurity: Not on file  Transportation Needs: Not on file  Physical Activity: Not on file  Stress: Not on file  Social Connections: Not on file  Intimate Partner Violence: Not on file    FAMILY HISTORY: Family History  Problem Relation Age of Onset   Heart disease Father    Breast cancer Sister 56   Esophageal cancer Maternal Uncle        dx after age 60   Lung cancer Paternal Uncle    Skin cancer Paternal Uncle     Review of Systems  Constitutional:  Negative for appetite change, chills, fatigue, fever and unexpected weight change.  HENT:   Negative for hearing loss, lump/mass and trouble swallowing.   Eyes:  Negative for eye problems and icterus.  Respiratory:  Negative for chest tightness, cough and shortness of breath.   Cardiovascular:  Negative for chest pain, leg swelling and palpitations.  Gastrointestinal:  Negative for abdominal distention, abdominal pain, constipation, diarrhea, nausea and vomiting.  Endocrine: Negative for  hot flashes.  Genitourinary:  Negative for difficulty urinating.   Musculoskeletal:  Negative for arthralgias.  Skin:  Negative for itching and rash.  Neurological:  Negative for dizziness, extremity weakness, headaches and numbness.  Hematological:  Negative for adenopathy. Does not bruise/bleed easily.  Psychiatric/Behavioral:  Negative for depression. The patient is not nervous/anxious.       PHYSICAL EXAMINATION  ECOG PERFORMANCE STATUS: 1 - Symptomatic but completely ambulatory  Vitals:   03/06/22 0909  BP: 122/63  Pulse: 64  Resp: 18  Temp: (!) 97.5 F (36.4 C)  SpO2: 100%    Physical Exam Constitutional:      General: She is not in acute distress.    Appearance: Normal appearance.  She is not toxic-appearing.  HENT:     Head: Normocephalic and atraumatic.  Eyes:     General: No scleral icterus. Cardiovascular:     Rate and Rhythm: Normal rate and regular rhythm.     Pulses: Normal pulses.     Heart sounds: Normal heart sounds.  Pulmonary:     Effort: Pulmonary effort is normal.     Breath sounds: Normal breath sounds.  Abdominal:     General: Abdomen is flat. Bowel sounds are normal. There is no distension.     Palpations: Abdomen is soft.     Tenderness: There is no abdominal tenderness.  Musculoskeletal:        General: No swelling.     Cervical back: Neck supple.  Lymphadenopathy:     Cervical: No cervical adenopathy.  Skin:    General: Skin is warm and dry.     Findings: No rash.  Neurological:     General: No focal deficit present.     Mental Status: She is alert.  Psychiatric:        Mood and Affect: Mood normal.        Behavior: Behavior normal.     LABORATORY DATA:  CBC    Component Value Date/Time   WBC 4.5 03/06/2022 0855   WBC 6.8 06/16/2019 1456   RBC 3.90 03/06/2022 0855   HGB 11.5 (L) 03/06/2022 0855   HCT 34.0 (L) 03/06/2022 0855   PLT 221 03/06/2022 0855   MCV 87.2 03/06/2022 0855   MCH 29.5 03/06/2022 0855   MCHC 33.8 03/06/2022 0855   RDW 12.1 03/06/2022 0855   LYMPHSABS 1.2 03/06/2022 0855   MONOABS 0.3 03/06/2022 0855   EOSABS 0.1 03/06/2022 0855   BASOSABS 0.0 03/06/2022 0855    CMP     Component Value Date/Time   NA 138 03/06/2022 0855   NA 142 10/31/2019 1545   K 3.8 03/06/2022 0855   CL 105 03/06/2022 0855   CO2 28 03/06/2022 0855   GLUCOSE 97 03/06/2022 0855   BUN 17 03/06/2022 0855   BUN 11 10/31/2019 1545   CREATININE 0.62 03/06/2022 0855   CALCIUM 9.2 03/06/2022 0855   PROT 6.8 03/06/2022 0855   PROT 6.7 08/01/2020 1004   ALBUMIN 4.1 03/06/2022 0855   ALBUMIN 4.5 08/01/2020 1004   AST 30 03/06/2022 0855   ALT 36 03/06/2022 0855   ALKPHOS 50 03/06/2022 0855   BILITOT 0.9 03/06/2022 0855    GFRNONAA >60 03/06/2022 0855   GFRAA 104 10/31/2019 1545      ASSESSMENT and THERAPY PLAN:   Malignant neoplasm of upper-outer quadrant of left breast in female, estrogen receptor positive (HCC) Gerrica is a 62 year old woman with history of stage Ia ER positive, HER2/neu positive left-sided breast cancer that  was diagnosed in June 2023.  He is status postlumpectomy followed by adjuvant chemotherapy with Taxol and Herceptin, maintenance Herceptin, adjuvant radiation and begins on antiestrogen therapy with tamoxifen today.  [name] and I discussed her treatment history and she is healing well and has no side effects.  She is up-to-date with her echocardiograms.  She will proceed with her next cycle of Herceptin today.  We briefly reviewed the tamoxifen that she will start today.  She will return in 3 weeks for Herceptin only and in 6 weeks for follow-up and Herceptin.   All questions were answered. The patient knows to call the clinic with any problems, questions or concerns. We can certainly see the patient much sooner if necessary. This note was electronically signed.  Total encounter time:30 minutes*in face-to-face visit time, chart review, lab review, care coordination, order entry, and documentation of the encounter time.    Wilber Bihari, NP 03/06/22 9:49 AM Medical Oncology and Hematology [facility] [place], [place] [address] Tel. [phone]    Fax. [phone]  *Total Encounter Time as defined by the Centers for Medicare and Medicaid Services includes, in addition to the face-to-face time of a patient visit (documented in the note above) non-face-to-face time: obtaining and reviewing outside history, ordering and reviewing medications, tests or procedures, care coordination (communications with other health care professionals or caregivers) and documentation in the medical record.

## 2022-03-06 NOTE — Assessment & Plan Note (Signed)
Christine [name] is a 62 year old woman with history of stage Ia ER positive, HER2/neu positive left-sided breast cancer that was diagnosed in June 2023.  He is status postlumpectomy followed by adjuvant chemotherapy with Taxol and Herceptin, maintenance Herceptin, adjuvant radiation and begins on antiestrogen therapy with tamoxifen today.  [name] and I discussed her treatment history and she is healing well and has no side effects.  She is up-to-date with her echocardiograms.  She will proceed with her next cycle of Herceptin today.  We briefly reviewed the tamoxifen that she will start today.  She will return in 3 weeks for Herceptin only and in 6 weeks for follow-up and Herceptin.

## 2022-03-06 NOTE — Progress Notes (Signed)
Cardiology Office Note:    Date:  03/06/2022   ID:  Christine [name], DOB [birth year], MRN [id number]  PCP:  Charlane Ferretti, MD  Cardiologist:  Buford Dresser, MD  Referring MD: [name], MD   CC: follow up  History of Present Illness:    Christine [name] is a 62 y.o. female with a hx of hypertension, prediabetes, anemia, GERD, breast cancer s/p lumpectomy, osteopenia, who is seen for follow up today. I initially saw her in consult on 10/06/19 for elevated calcium score. Coronary calcium score of 390 per coronary CT 11/2019.  At her last appointment she was doing well. Exercising and following a more plant-based diet. One year prior she donated blood and developed anemia. Was taking iron supplements every other day. She wasn't monitoring home blood pressures as she felt her cuff was inconsistent. Her BP was elevated in clinic, so we increased valsartan-HCTZ to a full pill daily (was taking 1/2 pill).   Today, she is feeling well. She reports her radiation and chemotherapy are completed. She is currently on immunotherapy every 3 weeks until July 2024. We reviewed her most recent echocardiogram 02/2022 showing LVEF 65-70%. The average left ventricular global longitudinal strain is -21.0 %. The global longitudinal strain is normal.  At home and with oncology visits her blood pressure has been stable. In clinic today her reading is 110/76.  A few months ago she saw her PCP for a physical and was found to be prediabetic. This morning she reports her glucose was 97. For the last 3 months she has been exercising more frequently and consistently.  Additionally she endorses osteopenia.  She denies any palpitations, chest pain, shortness of breath, or peripheral edema. No lightheadedness, headaches, syncope, orthopnea, or PND.   Past Medical History:  Diagnosis Date   Family history of breast cancer 07/31/2021   GERD (gastroesophageal reflux disease)    Hypertension     Past  Surgical History:  Procedure Laterality Date   BREAST LUMPECTOMY WITH RADIOACTIVE SEED AND SENTINEL LYMPH NODE BIOPSY Left 08/09/2021   Procedure: LEFT BREAST RADIOACTIVE SEED LOCALIZED LUMPECTOMY AND SENTINEL NODE BIOPSY;  Surgeon: Christine Kussmaul, MD;  Location: [facility];  Service: General;  Laterality: Left;  GEN & PEC BLOCK   CESAREAN SECTION     FOOT SURGERY Right    PORTACATH PLACEMENT Right 08/09/2021   Procedure: PORT PLACEMENT RIGHT  INTERNAL JUGULAR  WITH ULTRASOUND GUIDANCE;  Surgeon: Christine Kussmaul, MD;  Location: [facility];  Service: General;  Laterality: Right;  RIGHT INTERNAL JUGULAR PLACEMENT    Current Medications: Current Outpatient Medications on File Prior to Visit  Medication Sig   acetaminophen (TYLENOL) 500 MG tablet Take 1,000 mg by mouth every 6 (six) hours as needed for moderate pain.   aspirin EC 81 MG tablet Take 81 mg by mouth daily. Swallow whole.   betamethasone valerate ointment (VALISONE) 0.1 % Apply 1 Application topically 2 (two) times daily.   Cholecalciferol (DIALYVITE VITAMIN D 5000) 125 MCG (5000 UT) capsule Take 15,000 Units by mouth every Monday.   lidocaine-prilocaine (EMLA) cream Apply to affected area once   MAGNESIUM PO Take 150 mg by mouth 3 (three) times a week.   nitroGLYCERIN (NITROSTAT) 0.4 MG SL tablet Place 1 tablet (0.4 mg total) under the tongue every 5 (five) minutes as needed for chest pain.   Omega-3 Fatty Acids (OMEGA 3 500) 500 MG CAPS Take 500 mg by mouth daily.   rosuvastatin (CRESTOR) 40 MG tablet  TAKE 1 TABLET BY MOUTH EVERY DAY   tamoxifen (NOLVADEX) 20 MG tablet Take 1 tablet (20 mg total) by mouth daily.   valsartan-hydrochlorothiazide (DIOVAN-HCT) 80-12.5 MG tablet Take 1 tablet by mouth daily. Please keep your upcoming appointment for refills.   zolpidem (AMBIEN) 10 MG tablet Take 5-10 mg by mouth at bedtime as needed for sleep.   No current facility-administered medications on file prior to visit.     Allergies:   Lisinopril and  Mefloquine   Social History   Tobacco Use   Smoking status: Never   Smokeless tobacco: Never  Vaping Use   Vaping Use: Never used  Substance Use Topics   Alcohol use: Yes    Alcohol/week: 2.0 standard drinks of alcohol    Types: 2 Glasses of wine per week   Drug use: Never    Family History: family history includes Breast cancer (age of onset: 70) in her sister; Esophageal cancer in her maternal uncle; Heart disease in her father; Lung cancer in her paternal uncle; Skin cancer in her paternal uncle.  Father: hypertension, CAD with stents, mitral valve, now in his 83s. [place] died of heart related issues in his 19s No other issues, mother's side without heart disease  ROS:   Please see the history of present illness.   Additional pertinent ROS otherwise unremarkable.  EKGs/Labs/Other Studies Reviewed:    The following studies were reviewed today:  Echo  02/05/2022: Sonographer Comments: Global longitudinal strain was attempted.   IMPRESSIONS   1. Left ventricular ejection fraction, by estimation, is 65 to 70%. The  left ventricle has normal function. The left ventricle has no regional  wall motion abnormalities. Left ventricular diastolic parameters are  consistent with Grade I diastolic  dysfunction (impaired relaxation). The average left ventricular global  longitudinal strain is -21.0 %. The global longitudinal strain is normal.   2. Right ventricular systolic function is normal. The right ventricular  size is normal. There is normal pulmonary artery systolic pressure.   3. No evidence of mitral valve regurgitation.   4. The aortic valve is grossly normal. Aortic valve regurgitation is not  visualized.   5. The inferior vena cava is normal in size with greater than 50%  respiratory variability, suggesting right atrial pressure of 3 mmHg.   Conclusion(s)/Recommendation(s): Normal biventricular function without  evidence of hemodynamically significant valvular  heart disease. GLS is  normal. Normal study for age.   CT coronary with FFR 11/26/19 FINDINGS: Coronary calcium score: The patient's coronary artery calcium score is 390, which places the patient in the 99th percentile.   Coronary arteries: Normal coronary origins.  Right dominance.   Right Coronary Artery: Normal caliber vessel, gives rise to PDA. Scattered calcified plaque. Proximal RCA with 1-24% stenosis. Trivial stenosis in mid RCA. Distal RCA with calcified plaque, but very small caliber vessel   Left Main Coronary Artery: Normal caliber vessel. No significant plaque or stenosis.   Left Anterior Descending Coronary Artery: Normal caliber vessel. Significant mixed calcified and noncalcified plaque, most prominent in the proximal LAD. There appears to be a stenosis of at least 50-70% between a small D1 and large D2. There is scattered 25-49% stenosis throughout the proximal and mid LAD. Gives rise to small D1 and large D2 diagonal branches.   Left Circumflex Artery: Small caliber vessel. Trivial calcified plaque with 1-24% stenosis. Gives rise to medium OM branch.   Aorta: Normal size, 31 mm at the mid ascending aorta (level of the PA bifurcation)  measured double oblique. Scattered calcifications. No dissection.   Aortic Valve: No calcifications. Trileaflet.   Other findings:   Normal pulmonary vein drainage into the left atrium.   Normal left atrial appendage without a thrombus.   Normal size of the pulmonary artery.   IMPRESSION: 1. Moderate CAD, CADRADS = 3. Proximal LAD most concerning for significant stenosis. CT FFR will be performed and reported separately.   2. Coronary calcium score of 390. This was 99th percentile for age and sex matched control.   3. Normal coronary origin with right dominance.  FFR: 1. Left Main:  No significant stenosis. FFR = 0.99   2. LAD: There is no significant stenosis in the proximal LAD, FFR 0.96. However, there appears  to a focal stenosis between a small D1 and a large D2, at which point the FFR drops to 0.86. There is then another less focal area of stenosis just after the takeoff of D2 where the FFR gradually decreases from 0.84 to 0.78. The distal LAD has an FFR of 0.70 without focal stenosis 3. LCX: No significant stenosis. Proximal FFR = 0.99, Distal FFR = 0.97 4. RCA: No significant stenosis. Proximal FFR = 0.98, Mid FFR = 0.94, Distal FFR = 0.94   IMPRESSION: 1. CT FFR analysis did not show any significant stenosis in the left main, left circumflex, or right coronary system. There is stenosis in the proximal LAD that is not significant by FFR, but after a focal stenosis between D1 and D2, followed by a gradual stenosis after D2, the FFR does become significant.   ETT 10/18/19 Blood pressure demonstrated a normal response to exercise. ST segment depression was noted during stress.   ETT with good exercise tolerance (11:05); no chest pain, normal blood pressure response; ST depression of approximately 1 to 2 mm in the inferior leads possibly upsloping (borderline ST changes); if high suspicion suggest further evaluation such as cardiac CTA or stress nuclear study.  Ca score 09/26/19 Total score 538. Predominantly in LAD territory  Echo 2018 - Left ventricle: The cavity size was normal. Systolic function was    normal. The estimated ejection fraction was in the range of 55%    to 60%. Wall motion was normal; there were no regional wall    motion abnormalities. Doppler parameters are consistent with    abnormal left ventricular relaxation (grade 1 diastolic    dysfunction).  - Atrial septum: No defect or patent foramen ovale was identified.    Echo contrast study showed no right-to-left atrial level shunt,    at baseline or with provocation.   EKG:  EKG is personally reviewed.   03/06/2022:  sinus bradycardia at 52 bpm 01/23/21: sinus bradycardia at 55 bpm 12/06/19: sinus bradycardia at 54  bpm  Recent Labs: 03/06/2022: ALT 36; BUN 17; Creatinine 0.62; Hemoglobin 11.5; Platelet Count 221; Potassium 3.8; Sodium 138   Recent Lipid Panel    Component Value Date/Time   CHOL 116 08/01/2020 1004   TRIG 69 08/01/2020 1004   HDL 46 08/01/2020 1004   CHOLHDL 2.5 08/01/2020 1004   LDLCALC 56 08/01/2020 1004    Physical Exam:    VS:  BP 110/76 (BP Location: Right Arm, Patient Position: Sitting, Cuff Size: Normal)   Pulse (!) 52   Ht '5\' 4"'$  (1.626 m)   Wt 138 lb 11.2 oz (62.9 kg)   BMI 23.81 kg/m     Wt Readings from Last 3 Encounters:  03/06/22 138 lb 11.2 oz (62.9 kg)  03/06/22 136 lb 1.6 oz (61.7 kg)  02/24/22 136 lb 8 oz (61.9 kg)    GEN: Well nourished, well developed in no acute distress HEENT: Normal, moist mucous membranes NECK: No JVD CARDIAC: Bradycardic, regular rhythm, normal S1 and S2, no rubs or gallops. No murmur. VASCULAR: Radial and DP pulses 2+ bilaterally. No carotid bruits RESPIRATORY:  Clear to auscultation without rales, wheezing or rhonchi  ABDOMEN: Soft, non-tender, non-distended MUSCULOSKELETAL:  Ambulates independently SKIN: Warm and dry, no edema NEUROLOGIC:  Alert and oriented x 3. No focal neuro deficits noted. PSYCHIATRIC:  Normal affect   ASSESSMENT:    1. Essential hypertension   2. Coronary artery disease due to calcified coronary lesion   3. Pure hypercholesterolemia   4. Cardiac risk counseling     PLAN:    CAD, found on CT cardiac -proximal LAD lesion not significant by FFR, but does become significant in distal vessel. -ETT with indeterminate ST changes, predominantly upsloping but abnormal test -no chest pain. We discussed indications for cath -counseled on red flag warning signs that need immediate medical attention -continue aspirin, statin -has SL nitroglycerin, instructed on use -she has moved to predominantly plant based diet, which should help her cholesterol significantly  Hypercholesterolemia: -continue statin,  plant based diet -lipids at goal, LFTs normal  Hypertension: -continue valsartan-HCTZ  Cardiac risk counseling and prevention recommendations: -recommend heart healthy/Mediterranean diet, with whole grains, fruits, vegetable, fish, lean meats, nuts, and olive oil. Limit salt. -recommend moderate walking, 3-5 times/week for 30-50 minutes each session. Aim for at least 150 minutes.week. Goal should be pace of 3 miles/hours, or walking 1.5 miles in 30 minutes -recommend avoidance of tobacco products. Avoid excess alcohol.  Plan for follow up: 9 months per patient preference since she will complete immunotherapy in 6 months. Will see her sooner if needed.  Buford Dresser, MD, PhD [place]  [facility]    Medication Adjustments/Labs and Tests Ordered: Current medicines are reviewed at length with the patient today.  Concerns regarding medicines are outlined above.   No orders of the defined types were placed in this encounter.  No orders of the defined types were placed in this encounter.  Patient Instructions  Medication Instructions:  Your physician recommends that you continue on your current medications as directed. Please refer to the Current Medication list given to you today.  *If you need a refill on your cardiac medications before your next appointment, please call your pharmacy*  Lab Work: NONE  Testing/Procedures: NONE  Follow-Up: At [facility], you and your health needs are our priority.  As part of our continuing mission to provide you with exceptional heart care, we have created designated Provider Care Teams.  These Care Teams include your primary Cardiologist (physician) and Advanced Practice Providers (APPs -  Physician Assistants and Nurse Practitioners) who all work together to provide you with the care you need, when you need it.  We recommend signing up for the patient portal called "MyChart".  Sign up information is provided on this  After Visit Summary.  MyChart is used to connect with patients for Virtual Visits (Telemedicine).  Patients are able to view lab/test results, encounter notes, upcoming appointments, etc.  Non-urgent messages can be sent to your provider as well.   To learn more about what you can do with MyChart, go to [email or web address].    Your next appointment:   9 month(s)  The format for your next appointment:   In Person  Provider:  Buford Dresser, MD       I,Mathew Stumpf,acting as a scribe for Buford Dresser, MD.,have documented all relevant documentation on the behalf of Buford Dresser, MD,as directed by  Buford Dresser, MD while in the presence of Buford Dresser, MD.  I, Buford Dresser, MD, have reviewed all documentation for this visit. The documentation on 03/06/22 for the exam, diagnosis, procedures, and orders are all accurate and complete.   Signed, Buford Dresser, MD PhD 03/06/2022     [place]

## 2022-03-06 NOTE — Patient Instructions (Signed)
[facility]  Discharge Instructions: Thank you for choosing [facility] to provide your oncology and hematology care.   If you have a lab appointment with the Cancer Center, please go directly to the Cancer Center and check in at the registration area.   Wear comfortable clothing and clothing appropriate for easy access to any Portacath or PICC line.   We strive to give you quality time with your provider. You may need to reschedule your appointment if you arrive late (15 or more minutes).  Arriving late affects you and other patients whose appointments are after yours.  Also, if you miss three or more appointments without notifying the office, you may be dismissed from the clinic at the provider's discretion.      For prescription refill requests, have your pharmacy contact our office and allow 72 hours for refills to be completed.    Today you received the following chemotherapy and/or immunotherapy agents: Ogivri      To help prevent nausea and vomiting after your treatment, we encourage you to take your nausea medication as directed.  BELOW ARE SYMPTOMS THAT SHOULD BE REPORTED IMMEDIATELY: *FEVER GREATER THAN 100.4 F (38 C) OR HIGHER *CHILLS OR SWEATING *NAUSEA AND VOMITING THAT IS NOT CONTROLLED WITH YOUR NAUSEA MEDICATION *UNUSUAL SHORTNESS OF BREATH *UNUSUAL BRUISING OR BLEEDING *URINARY PROBLEMS (pain or burning when urinating, or frequent urination) *BOWEL PROBLEMS (unusual diarrhea, constipation, pain near the anus) TENDERNESS IN MOUTH AND THROAT WITH OR WITHOUT PRESENCE OF ULCERS (sore throat, sores in mouth, or a toothache) UNUSUAL RASH, SWELLING OR PAIN  UNUSUAL VAGINAL DISCHARGE OR ITCHING   Items with * indicate a potential emergency and should be followed up as soon as possible or go to the Emergency Department if any problems should occur.  Please show the CHEMOTHERAPY ALERT CARD or IMMUNOTHERAPY ALERT CARD at check-in  to the Emergency Department and triage nurse.  Should you have questions after your visit or need to cancel or reschedule your appointment, please contact [facility]  Dept: [phone]  and follow the prompts.  Office hours are 8:00 a.m. to 4:30 p.m. Monday - Friday. Please note that voicemails left after 4:00 p.m. may not be returned until the following business day.  We are closed weekends and major holidays. You have access to a nurse at all times for urgent questions. Please call the main number to the clinic Dept: [phone] and follow the prompts.   For any non-urgent questions, you may also contact your provider using MyChart. We now offer e-Visits for anyone 18 and older to request care online for non-urgent symptoms. For details visit [email or web address].   Also download the MyChart app! Go to the app store, search "MyChart", open the app, select , and log in with your MyChart username and password. 

## 2022-03-06 NOTE — Patient Instructions (Signed)
Medication Instructions:  Your physician recommends that you continue on your current medications as directed. Please refer to the Current Medication list given to you today.  *If you need a refill on your cardiac medications before your next appointment, please call your pharmacy*  Lab Work: NONE  Testing/Procedures: NONE  Follow-Up: At [facility], you and your health needs are our priority.  As part of our continuing mission to provide you with exceptional heart care, we have created designated Provider Care Teams.  These Care Teams include your primary Cardiologist (physician) and Advanced Practice Providers (APPs -  Physician Assistants and Nurse Practitioners) who all work together to provide you with the care you need, when you need it.  We recommend signing up for the patient portal called "MyChart".  Sign up information is provided on this After Visit Summary.  MyChart is used to connect with patients for Virtual Visits (Telemedicine).  Patients are able to view lab/test results, encounter notes, upcoming appointments, etc.  Non-urgent messages can be sent to your provider as well.   To learn more about what you can do with MyChart, go to [email or web address].    Your next appointment:   9 month(s)  The format for your next appointment:   In Person  Provider:   Buford Dresser, MD

## 2022-03-17 ENCOUNTER — Ambulatory Visit
Admission: RE | Admit: 2022-03-17 | Discharge: 2022-03-17 | Disposition: A | Discharge status: Home / Self Care | Payer: No Typology Code available for payment source | Source: Ambulatory Visit | Attending: Radiation Oncology | Admitting: Radiation Oncology

## 2022-03-17 NOTE — Progress Notes (Addendum)
  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 A detailed voicemail was left.  The patient was encouraged to avoid sun exposure in the area of prior treatment for up to one year following radiation with either sunscreen or by the style of clothing worn in the sun.  The patient has NOT scheduled follow up with her medical oncologist Dr. Chryl Heck for ongoing surveillance, and was encouraged to call if she develops concerns or questions regarding radiation.    [name], LPN

## 2022-03-18 NOTE — Progress Notes (Signed)
Received call from Ginger at [facility] asking for most recent OV note. Faxed and fax conf received.

## 2022-03-20 ENCOUNTER — Other Ambulatory Visit: Payer: Self-pay | Admitting: Cardiology

## 2022-03-20 DIAGNOSIS — I251 Atherosclerotic heart disease 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: Secondary | ICD-10-CM

## 2022-03-20 NOTE — Telephone Encounter (Signed)
Rx request sent to pharmacy.  

## 2022-03-27 ENCOUNTER — Telehealth: Payer: Self-pay | Admitting: Adult Health

## 2022-03-27 ENCOUNTER — Inpatient Hospital Stay: Payer: No Typology Code available for payment source

## 2022-03-27 VITALS — BP 110/58 | HR 69 | Temp 99.1°F | Resp 18 | Wt 137.5 lb

## 2022-03-27 DIAGNOSIS — Z5112 Encounter for antineoplastic immunotherapy: Secondary | ICD-10-CM | POA: Diagnosis not present

## 2022-03-27 DIAGNOSIS — Z170 Estrogen receptor positive status [ER+]: Secondary | ICD-10-CM

## 2022-03-27 MED ORDER — TRASTUZUMAB-DKST CHEMO 150 MG IV SOLR
378.0000 mg | Freq: Once | INTRAVENOUS | Status: AC
  Administered 2022-03-27: 378 mg via INTRAVENOUS
  Filled 2022-03-27: qty 18

## 2022-03-27 MED ORDER — SODIUM CHLORIDE 0.9 % IV SOLN: Freq: Once | INTRAVENOUS | Status: AC

## 2022-03-27 MED ORDER — ACETAMINOPHEN 325 MG PO TABS
650.0000 mg | ORAL_TABLET | Freq: Once | ORAL | Status: AC
  Administered 2022-03-27: 650 mg via ORAL
  Filled 2022-03-27: qty 2

## 2022-03-27 MED ORDER — DIPHENHYDRAMINE HCL 25 MG PO CAPS
25.0000 mg | ORAL_CAPSULE | Freq: Once | ORAL | Status: AC
  Administered 2022-03-27: 25 mg via ORAL
  Filled 2022-03-27: qty 1

## 2022-03-27 NOTE — Telephone Encounter (Signed)
Rescheduled appointment per patients request. Patient is aware of the changes made to her upcoming appointments.

## 2022-03-27 NOTE — Patient Instructions (Signed)
[facility]  Discharge Instructions: Thank you for choosing [facility] to provide your oncology and hematology care.   If you have a lab appointment with the [facility], please go directly to the [facility] and check in at the registration area.   Wear comfortable clothing and clothing appropriate for easy access to any Portacath or PICC line.   We strive to give you quality time with your provider. You may need to reschedule your appointment if you arrive late (15 or more minutes).  Arriving late affects you and other patients whose appointments are after yours.  Also, if you miss three or more appointments without notifying the office, you may be dismissed from the clinic at the provider's discretion.      For prescription refill requests, have your pharmacy contact our office and allow 72 hours for refills to be completed.    Today you received the following chemotherapy and/or immunotherapy agents herceptin      To help prevent nausea and vomiting after your treatment, we encourage you to take your nausea medication as directed.  BELOW ARE SYMPTOMS THAT SHOULD BE REPORTED IMMEDIATELY: *FEVER GREATER THAN 100.4 F (38 C) OR HIGHER *CHILLS OR SWEATING *NAUSEA AND VOMITING THAT IS NOT CONTROLLED WITH YOUR NAUSEA MEDICATION *UNUSUAL SHORTNESS OF BREATH *UNUSUAL BRUISING OR BLEEDING *URINARY PROBLEMS (pain or burning when urinating, or frequent urination) *BOWEL PROBLEMS (unusual diarrhea, constipation, pain near the anus) TENDERNESS IN MOUTH AND THROAT WITH OR WITHOUT PRESENCE OF ULCERS (sore throat, sores in mouth, or a toothache) UNUSUAL RASH, SWELLING OR PAIN  UNUSUAL VAGINAL DISCHARGE OR ITCHING   Items with * indicate a potential emergency and should be followed up as soon as possible or go to the Emergency Department if any problems should occur.  Please show the CHEMOTHERAPY ALERT CARD or IMMUNOTHERAPY ALERT CARD at  check-in to the Emergency Department and triage nurse.  Should you have questions after your visit or need to cancel or reschedule your appointment, please contact [facility]  Dept: [phone]  and follow the prompts.  Office hours are 8:00 a.m. to 4:30 p.m. Monday - Friday. Please note that voicemails left after 4:00 p.m. may not be returned until the following business day.  We are closed weekends and major holidays. You have access to a nurse at all times for urgent questions. Please call the main number to the clinic Dept: [phone] and follow the prompts.   For any non-urgent questions, you may also contact your provider using MyChart. We now offer e-Visits for anyone 33 and older to request care online for non-urgent symptoms. For details visit [email or web address].   Also download the MyChart app! Go to the app store, search "MyChart", open the app, select [facility], and log in with your MyChart username and password.

## 2022-04-09 ENCOUNTER — Encounter (HOSPITAL_BASED_OUTPATIENT_CLINIC_OR_DEPARTMENT_OTHER): Payer: Self-pay | Admitting: Cardiology

## 2022-04-17 ENCOUNTER — Inpatient Hospital Stay: Payer: No Typology Code available for payment source

## 2022-04-17 ENCOUNTER — Inpatient Hospital Stay
Payer: No Typology Code available for payment source | Attending: Hematology and Oncology | Admitting: Hematology and Oncology

## 2022-04-17 VITALS — BP 126/56 | HR 64 | Temp 97.9°F | Resp 16 | Ht 64.0 in | Wt 138.4 lb

## 2022-04-17 VITALS — BP 131/76 | HR 55 | Temp 97.9°F | Resp 17

## 2022-04-17 DIAGNOSIS — Z170 Estrogen receptor positive status [ER+]: Secondary | ICD-10-CM | POA: Insufficient documentation

## 2022-04-17 DIAGNOSIS — C50412 Malignant neoplasm of upper-outer quadrant of left female breast: Secondary | ICD-10-CM

## 2022-04-17 DIAGNOSIS — Z95828 Presence of other vascular implants and grafts: Secondary | ICD-10-CM

## 2022-04-17 DIAGNOSIS — Z5112 Encounter for antineoplastic immunotherapy: Secondary | ICD-10-CM | POA: Insufficient documentation

## 2022-04-17 LAB — CMP (CANCER CENTER ONLY)
ALT: 20 U/L (ref 0–44)
AST: 24 U/L (ref 15–41)
Albumin: 3.9 g/dL (ref 3.5–5.0)
Alkaline Phosphatase: 38 U/L (ref 38–126)
Anion gap: 4 — ABNORMAL LOW (ref 5–15)
BUN: 18 mg/dL (ref 8–23)
CO2: 29 mmol/L (ref 22–32)
Calcium: 8.6 mg/dL — ABNORMAL LOW (ref 8.9–10.3)
Chloride: 107 mmol/L (ref 98–111)
Creatinine: 0.73 mg/dL (ref 0.44–1.00)
GFR, Estimated: >60 mL/min (ref >60)
Glucose, Bld: 100 mg/dL — ABNORMAL HIGH (ref 70–99)
Potassium: 3.7 mmol/L (ref 3.5–5.1)
Sodium: 140 mmol/L (ref 135–145)
Total Bilirubin: 0.8 mg/dL (ref 0.3–1.2)
Total Protein: 6.3 g/dL — ABNORMAL LOW (ref 6.5–8.1)

## 2022-04-17 LAB — CBC WITH DIFFERENTIAL (CANCER CENTER ONLY)
Abs Immature Granulocytes: 0.01 10*3/uL (ref 0.00–0.07)
Basophils Absolute: 0.1 10*3/uL (ref 0.0–0.1)
Basophils Relative: 1 %
Eosinophils Absolute: 0.2 10*3/uL (ref 0.0–0.5)
Eosinophils Relative: 4 %
HCT: 31.9 % — ABNORMAL LOW (ref 36.0–46.0)
Hemoglobin: 10.9 g/dL — ABNORMAL LOW (ref 12.0–15.0)
Immature Granulocytes: 0 %
Lymphocytes Relative: 30 %
Lymphs Abs: 1.4 10*3/uL (ref 0.7–4.0)
MCH: 30.3 pg (ref 26.0–34.0)
MCHC: 34.2 g/dL (ref 30.0–36.0)
MCV: 88.6 fL (ref 80.0–100.0)
Monocytes Absolute: 0.4 10*3/uL (ref 0.1–1.0)
Monocytes Relative: 8 %
Neutro Abs: 2.6 10*3/uL (ref 1.7–7.7)
Neutrophils Relative %: 57 %
Platelet Count: 193 10*3/uL (ref 150–400)
RBC: 3.6 MIL/uL — ABNORMAL LOW (ref 3.87–5.11)
RDW: 12.9 % (ref 11.5–15.5)
WBC Count: 4.6 10*3/uL (ref 4.0–10.5)
nRBC: 0 % (ref 0.0–0.2)

## 2022-04-17 MED ORDER — HEPARIN SOD (PORK) LOCK FLUSH 100 UNIT/ML IV SOLN
500.0000 [IU] | Freq: Once | INTRAVENOUS | Status: AC | PRN
  Administered 2022-04-17: 500 [IU]

## 2022-04-17 MED ORDER — SODIUM CHLORIDE 0.9% FLUSH
10.0000 mL | Freq: Once | INTRAVENOUS | Status: AC
  Administered 2022-04-17: 10 mL

## 2022-04-17 MED ORDER — ACETAMINOPHEN 325 MG PO TABS
650.0000 mg | ORAL_TABLET | Freq: Once | ORAL | Status: AC
  Administered 2022-04-17: 650 mg via ORAL
  Filled 2022-04-17: qty 2

## 2022-04-17 MED ORDER — SODIUM CHLORIDE 0.9 % IV SOLN: Freq: Once | INTRAVENOUS | Status: AC

## 2022-04-17 MED ORDER — SODIUM CHLORIDE 0.9% FLUSH
10.0000 mL | INTRAVENOUS | Status: DC | PRN
  Administered 2022-04-17: 10 mL

## 2022-04-17 MED ORDER — TRASTUZUMAB-DKST CHEMO 150 MG IV SOLR
378.0000 mg | Freq: Once | INTRAVENOUS | Status: AC
  Administered 2022-04-17: 378 mg via INTRAVENOUS
  Filled 2022-04-17: qty 18

## 2022-04-17 MED ORDER — DIPHENHYDRAMINE HCL 25 MG PO CAPS
25.0000 mg | ORAL_CAPSULE | Freq: Once | ORAL | Status: AC
  Administered 2022-04-17: 25 mg via ORAL
  Filled 2022-04-17: qty 1

## 2022-04-17 NOTE — Patient Instructions (Signed)
[facility]  Discharge Instructions: Thank you for choosing [facility] to provide your oncology and hematology care.   If you have a lab appointment with the Cancer Center, please go directly to the Cancer Center and check in at the registration area.   Wear comfortable clothing and clothing appropriate for easy access to any Portacath or PICC line.   We strive to give you quality time with your provider. You may need to reschedule your appointment if you arrive late (15 or more minutes).  Arriving late affects you and other patients whose appointments are after yours.  Also, if you miss three or more appointments without notifying the office, you may be dismissed from the clinic at the provider's discretion.      For prescription refill requests, have your pharmacy contact our office and allow 72 hours for refills to be completed.    Today you received the following chemotherapy and/or immunotherapy agents: Ogivri      To help prevent nausea and vomiting after your treatment, we encourage you to take your nausea medication as directed.  BELOW ARE SYMPTOMS THAT SHOULD BE REPORTED IMMEDIATELY: *FEVER GREATER THAN 100.4 F (38 C) OR HIGHER *CHILLS OR SWEATING *NAUSEA AND VOMITING THAT IS NOT CONTROLLED WITH YOUR NAUSEA MEDICATION *UNUSUAL SHORTNESS OF BREATH *UNUSUAL BRUISING OR BLEEDING *URINARY PROBLEMS (pain or burning when urinating, or frequent urination) *BOWEL PROBLEMS (unusual diarrhea, constipation, pain near the anus) TENDERNESS IN MOUTH AND THROAT WITH OR WITHOUT PRESENCE OF ULCERS (sore throat, sores in mouth, or a toothache) UNUSUAL RASH, SWELLING OR PAIN  UNUSUAL VAGINAL DISCHARGE OR ITCHING   Items with * indicate a potential emergency and should be followed up as soon as possible or go to the Emergency Department if any problems should occur.  Please show the CHEMOTHERAPY ALERT CARD or IMMUNOTHERAPY ALERT CARD at check-in  to the Emergency Department and triage nurse.  Should you have questions after your visit or need to cancel or reschedule your appointment, please contact [facility]  Dept: [phone]  and follow the prompts.  Office hours are 8:00 a.m. to 4:30 p.m. Monday - Friday. Please note that voicemails left after 4:00 p.m. may not be returned until the following business day.  We are closed weekends and major holidays. You have access to a nurse at all times for urgent questions. Please call the main number to the clinic Dept: [phone] and follow the prompts.   For any non-urgent questions, you may also contact your provider using MyChart. We now offer e-Visits for anyone 18 and older to request care online for non-urgent symptoms. For details visit [email or web address].   Also download the MyChart app! Go to the app store, search "MyChart", open the app, select [facility], and log in with your MyChart username and password. 

## 2022-04-17 NOTE — Assessment & Plan Note (Signed)
Christine [name] is a 62 year old woman with stage Ia ER positive, PR negative, Her 2 amplified breast cancer status postlumpectomy and currently undergoing adjuvant chemotherapy with Taxol and Herceptin.  She is now status post adjuvant Herceptin and Taxol.  She is on Herceptin and Tamoxifen. She is tolerating treatment very well. She has noted some increased sensitivity in her nasal cavity, swelling, recommended claritin daily for a couple weeks and to keep [place] posted if there is any improvement. No concerns on exam Repeat ECHO ordered for April, to be scheduled Herceptin every 3 weeks, FU every 6 weeks.  Benay Pike MD

## 2022-04-17 NOTE — Progress Notes (Signed)
[facility] Follow up:    Charlane Ferretti, MD [address] [place] [address]   DIAGNOSIS:  Cancer Staging  Malignant neoplasm of upper-outer quadrant of left breast in female, estrogen receptor positive ([place]) Staging form: Breast, AJCC 8th Edition - Clinical stage from 07/29/2021: Stage IA (cT1c, cN0, cM0, G3, ER+, PR-, HER2+) - Signed by Hayden Pedro, PA-C on 07/31/2021 Stage prefix: Initial diagnosis Method of lymph node assessment: Clinical Histologic grading system: 3 grade system   SUMMARY OF ONCOLOGIC HISTORY: Oncology History  Malignant neoplasm of upper-outer quadrant of left breast in female, estrogen receptor positive ([place])  07/17/2021 Mammogram   Suspicious left breast mass at 3 o'clock, 4 cm from the nipple. No axillary adenopathy. Targeted ultrasound is performed, showing an irregular centrally hypoechoic mass with an echogenic rim measuring 7 x 5 by 7 mm, thought to correlate with the mammographically identified mass. This mass is located at 3 o'clock, 4 cm from the nipple. No axillary adenopathy.   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female, estrogen receptor positive ([place])   07/29/2021 Cancer Staging   Staging form: Breast, AJCC 8th Edition - Clinical stage from 07/29/2021: Stage IA (cT1c, cN0, cM0, G3, ER+, PR-, HER2+) - Signed by Hayden Pedro, PA-C on 07/31/2021 Stage prefix: Initial diagnosis Method of lymph node assessment: Clinical Histologic grading system: 3 grade system    Pathology Results   Path showed IDC, grade 3, ER 100% positive, strong staining intensity, PR negative, ki 67 35%, Her 2 positive   08/05/2021 Genetic Testing   Negative hereditary cancer genetic testing: no pathogenic variants detected in [facility] BRCAPlus Panel and [facility] CustomNext-Cancer +RNAinsight Panel.  Report dates are August 05, 2021 and August 08, 2021.    The BRCAplus panel  offered by [employer] and includes sequencing and deletion/duplication analysis for the following 8 genes: ATM, BRCA1, BRCA2, CDH1, CHEK2, PALB2, PTEN, and TP53. The CustomNext-Cancer+RNAinsight panel offered by [employer] includes sequencing and rearrangement analysis for the following 47 genes:  APC, ATM, AXIN2, BARD1, BMPR1A, BRCA1, BRCA2, BRIP1, CDH1, CDK4, CDKN2A, CHEK2, DICER1, EPCAM, GREM1, HOXB13, MEN1, MLH1, MSH2, MSH3, MSH6, MUTYH, NBN, NF1, NF2, NTHL1, PALB2, PMS2, POLD1, POLE, PTEN, RAD51C, RAD51D, RECQL, RET, SDHA, SDHAF2, SDHB, SDHC, SDHD, SMAD4, SMARCA4, STK11, TP53, TSC1, TSC2, and VHL.  RNA data is routinely analyzed for use in variant interpretation for all genes.   08/09/2021 Surgery   Left lumpectomy: IDC, grade 3, 3 sentinel lymph nodes all negative for cancer.  He has biomarker testing ER positive, PR negative, HER2 positive.  pT1c, PN0.   09/12/2021 -  Adjuvant Chemotherapy   Taxol/Herceptin weekly x12, followed by Herceptin given every 3 weeks to complete 1 year of treatment.     CURRENT THERAPY: Herceptin; Tamoxifen daily  INTERVAL HISTORY:  TYRONZA KITTELL 62 y.o. female returns for evaluation prior to receiving Herceptin therapy.  Her most recent echocardiogram occurred on February 05, 2022 demonstrating a left ventricular ejection fraction of 65 to 70% and a normal global longitudinal strain.  She is now on adjuvant Tamoxifen. She says she is tolerating this very well actually. She may have noted some arthralgias but overall tolerable. She says nasal passages are bothersome, they are very sensitive, there is sore in the right nasal cavity which doesn't heal. They don't necessarily bleed. She has tried some contestants in December which didn't quite help with this sensation.  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  Rest of the pertinent 10 point ROS reviewed and neg  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 07/31/2021   Easy bruising 07/31/2021   Essential hypertension 07/31/2021   Hearing loss in left ear 07/31/2021   Insomnia 07/31/2021   Mixed hyperlipidemia 07/31/2021   Other long term (current) drug therapy 07/31/2021   Sleep disorder 07/31/2021   Tendency toward bleeding easily ([place]) 07/31/2021   Vitamin D deficiency 07/31/2021   Malignant neoplasm of upper-outer quadrant of left breast in female, estrogen receptor positive ([place]) 07/25/2021   Abnormal finding on mammography 07/10/2021   GERD (gastroesophageal reflux disease) 08/31/2019   Globus pharyngeus 08/02/2019   Rhinitis, chronic 08/02/2019   Elevated blood pressure reading 01/26/2019   Acute pain of right knee 10/26/2017    is allergic to lisinopril and mefloquine.  MEDICAL HISTORY: Past Medical History:  Diagnosis Date   Family history of breast cancer 07/31/2021   GERD (gastroesophageal reflux disease)    Hypertension     SURGICAL HISTORY: Past Surgical History:  Procedure Laterality Date   BREAST LUMPECTOMY WITH RADIOACTIVE SEED AND SENTINEL LYMPH NODE BIOPSY Left 08/09/2021   Procedure: LEFT BREAST RADIOACTIVE SEED LOCALIZED LUMPECTOMY AND SENTINEL NODE BIOPSY;  Surgeon: Jovita Kussmaul, MD;  Location: [facility];  Service: General;  Laterality: Left;  GEN & PEC BLOCK   CESAREAN SECTION     FOOT SURGERY Right    PORTACATH PLACEMENT Right 08/09/2021   Procedure: PORT PLACEMENT RIGHT  INTERNAL JUGULAR  WITH ULTRASOUND GUIDANCE;  Surgeon: Jovita Kussmaul, MD;  Location: [facility];  Service: General;  Laterality: Right;  RIGHT INTERNAL JUGULAR PLACEMENT    SOCIAL HISTORY: Social History   Socioeconomic History   Marital status: Married    Spouse name: Not on  file   Number of children: Not on file   Years of education: Not on file   Highest education level: Not on file  Occupational History   Not on file  Tobacco Use   Smoking status: Never   Smokeless tobacco: Never  Vaping Use   Vaping Use: Never used  Substance and Sexual Activity   Alcohol use: Yes    Alcohol/week: 2.0 standard drinks of alcohol    Types: 2 Glasses of wine per week   Drug use: Never   Sexual activity: Not on file  Other Topics Concern   Not on file  Social History Narrative   Not on file   Social Determinants of Health   Financial Resource Strain: Not on file  Food Insecurity: Not on file  Transportation Needs: Not on file  Physical Activity: Not on file  Stress: Not on file  Social Connections: Not on file  Intimate Partner Violence: Not on file    FAMILY HISTORY: Family History  Problem Relation Age of Onset   Heart disease Father    Breast cancer Sister 51   Esophageal cancer Maternal Uncle        dx after age 103   Lung cancer Paternal Uncle    Skin cancer Paternal Uncle     Review of Systems  Constitutional:  Negative for appetite change, chills, fatigue, fever and unexpected weight change.  HENT:  Negative for hearing loss, lump/mass and trouble swallowing.   Eyes:  Negative for eye problems and icterus.  Respiratory:  Negative for chest tightness, cough and shortness of breath.   Cardiovascular:  Negative for chest pain, leg swelling and palpitations.  Gastrointestinal:  Negative for abdominal distention, abdominal pain, constipation, diarrhea, nausea and vomiting.  Endocrine: Negative for hot flashes.  Genitourinary:  Negative for difficulty urinating.   Musculoskeletal:  Negative for arthralgias.  Skin:  Negative for itching and rash.  Neurological:  Negative for dizziness, extremity weakness, headaches and numbness.  Hematological:  Negative for adenopathy. Does not bruise/bleed easily.  Psychiatric/Behavioral:  Negative for  depression. The patient is not nervous/anxious.       PHYSICAL EXAMINATION  ECOG PERFORMANCE STATUS: 1 - Symptomatic but completely ambulatory  Vitals:   04/17/22 0910  BP: (!) 126/56  Pulse: 64  Resp: 16  Temp: 97.9 F (36.6 C)  SpO2: 100%    Physical Exam Constitutional:      General: She is not in acute distress.    Appearance: Normal appearance. She is not toxic-appearing.  HENT:     Head: Normocephalic and atraumatic.  Eyes:     General: No scleral icterus. Cardiovascular:     Rate and Rhythm: Normal rate and regular rhythm.     Pulses: Normal pulses.     Heart sounds: Normal heart sounds.  Pulmonary:     Effort: Pulmonary effort is normal.     Breath sounds: Normal breath sounds.  Abdominal:     General: Abdomen is flat. Bowel sounds are normal. There is no distension.     Palpations: Abdomen is soft.     Tenderness: There is no abdominal tenderness.  Musculoskeletal:        General: No swelling.     Cervical back: Neck supple.  Lymphadenopathy:     Cervical: No cervical adenopathy.  Skin:    General: Skin is warm and dry.     Findings: No rash.  Neurological:     General: No focal deficit present.     Mental Status: She is alert.  Psychiatric:        Mood and Affect: Mood normal.        Behavior: Behavior normal.     LABORATORY DATA:  CBC    Component Value Date/Time   WBC 4.6 04/17/2022 0850   WBC 6.8 06/16/2019 1456   RBC 3.60 (L) 04/17/2022 0850   HGB 10.9 (L) 04/17/2022 0850   HCT 31.9 (L) 04/17/2022 0850   PLT 193 04/17/2022 0850   MCV 88.6 04/17/2022 0850   MCH 30.3 04/17/2022 0850   MCHC 34.2 04/17/2022 0850   RDW 12.9 04/17/2022 0850   LYMPHSABS 1.4 04/17/2022 0850   MONOABS 0.4 04/17/2022 0850   EOSABS 0.2 04/17/2022 0850   BASOSABS 0.1 04/17/2022 0850    CMP     Component Value Date/Time   NA 138 03/06/2022 0855   NA 142 10/31/2019 1545   K 3.8 03/06/2022 0855   CL 105 03/06/2022 0855   CO2 28 03/06/2022 0855    GLUCOSE 97 03/06/2022 0855   BUN 17 03/06/2022 0855   BUN 11 10/31/2019 1545   CREATININE 0.62 03/06/2022 0855   CALCIUM 9.2 03/06/2022 0855   PROT 6.8 03/06/2022 0855   PROT 6.7 08/01/2020 1004   ALBUMIN 4.1 03/06/2022 0855   ALBUMIN 4.5 08/01/2020 1004   AST 30 03/06/2022 0855   ALT 36 03/06/2022 0855   ALKPHOS 50 03/06/2022 0855  BILITOT 0.9 03/06/2022 0855   GFRNONAA >60 03/06/2022 0855   GFRAA 104 10/31/2019 1545      ASSESSMENT and THERAPY PLAN:   Malignant neoplasm of upper-outer quadrant of left breast in female, estrogen receptor positive ([facility]) Anneliz is a 62 year old woman with stage Ia ER positive, PR negative, Her 2 amplified breast cancer status postlumpectomy and currently undergoing adjuvant chemotherapy with Taxol and Herceptin.  She is now status post adjuvant Herceptin and Taxol.  She is on Herceptin and Tamoxifen. She is tolerating treatment very well. She has noted some increased sensitivity in her nasal cavity, swelling, recommended claritin daily for a couple weeks and to keep [place] posted if there is any improvement. No concerns on exam Repeat ECHO ordered for April, to be scheduled Herceptin every 3 weeks, FU every 6 weeks.  Benay Pike MD   All questions were answered. The patient knows to call the clinic with any problems, questions or concerns. We can certainly see the patient much sooner if necessary. This note was electronically signed.  Total encounter time:30 minutes*in face-to-face visit time, chart review, lab review, care coordination, order entry, and documentation of the encounter time.  *Total Encounter Time as defined by the Centers for Medicare and Medicaid Services includes, in addition to the face-to-face time of a patient visit (documented in the note above) non-face-to-face time: obtaining and reviewing outside history, ordering and reviewing medications, tests or procedures, care coordination (communications with other health care  professionals or caregivers) and documentation in the medical record.

## 2022-04-18 ENCOUNTER — Encounter: Payer: Self-pay | Admitting: *Deleted

## 2022-04-18 ENCOUNTER — Telehealth: Payer: Self-pay | Admitting: Hematology and Oncology

## 2022-04-18 NOTE — Telephone Encounter (Signed)
Spoke with patient confirming upcoming appointments  

## 2022-04-21 ENCOUNTER — Ambulatory Visit (HOSPITAL_COMMUNITY): Admission: RE | Admit: 2022-04-21 | Payer: No Typology Code available for payment source | Source: Ambulatory Visit

## 2022-04-29 ENCOUNTER — Encounter: Payer: Self-pay | Admitting: Hematology and Oncology

## 2022-05-01 ENCOUNTER — Encounter: Payer: Self-pay | Admitting: Hematology and Oncology

## 2022-05-06 ENCOUNTER — Other Ambulatory Visit: Payer: Self-pay

## 2022-05-06 ENCOUNTER — Ambulatory Visit: Payer: No Typology Code available for payment source

## 2022-05-06 ENCOUNTER — Encounter: Payer: Self-pay | Admitting: Hematology and Oncology

## 2022-05-08 ENCOUNTER — Inpatient Hospital Stay: Payer: No Typology Code available for payment source | Attending: Hematology and Oncology

## 2022-05-08 VITALS — BP 118/53 | HR 58 | Temp 98.3°F | Resp 16 | Wt 137.8 lb

## 2022-05-08 DIAGNOSIS — D509 Iron deficiency anemia, unspecified: Secondary | ICD-10-CM | POA: Diagnosis not present

## 2022-05-08 DIAGNOSIS — G629 Polyneuropathy, unspecified: Secondary | ICD-10-CM | POA: Insufficient documentation

## 2022-05-08 DIAGNOSIS — K219 Gastro-esophageal reflux disease without esophagitis: Secondary | ICD-10-CM | POA: Diagnosis not present

## 2022-05-08 DIAGNOSIS — D6481 Anemia due to antineoplastic chemotherapy: Secondary | ICD-10-CM | POA: Insufficient documentation

## 2022-05-08 DIAGNOSIS — Z5112 Encounter for antineoplastic immunotherapy: Secondary | ICD-10-CM | POA: Insufficient documentation

## 2022-05-08 DIAGNOSIS — M25561 Pain in right knee: Secondary | ICD-10-CM | POA: Diagnosis not present

## 2022-05-08 DIAGNOSIS — C50412 Malignant neoplasm of upper-outer quadrant of left female breast: Secondary | ICD-10-CM | POA: Diagnosis not present

## 2022-05-08 DIAGNOSIS — I251 Atherosclerotic heart disease 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: Secondary | ICD-10-CM | POA: Insufficient documentation

## 2022-05-08 DIAGNOSIS — Z170 Estrogen receptor positive status [ER+]: Secondary | ICD-10-CM | POA: Insufficient documentation

## 2022-05-08 DIAGNOSIS — I10 Essential (primary) hypertension: Secondary | ICD-10-CM | POA: Insufficient documentation

## 2022-05-08 MED ORDER — SODIUM CHLORIDE 0.9% FLUSH
10.0000 mL | INTRAVENOUS | Status: DC | PRN
  Administered 2022-05-08: 10 mL

## 2022-05-08 MED ORDER — DIPHENHYDRAMINE HCL 25 MG PO CAPS
25.0000 mg | ORAL_CAPSULE | Freq: Once | ORAL | Status: AC
  Administered 2022-05-08: 25 mg via ORAL
  Filled 2022-05-08: qty 1

## 2022-05-08 MED ORDER — TRASTUZUMAB-DKST CHEMO 420 MG IV SOLR
378.0000 mg | Freq: Once | INTRAVENOUS | Status: AC
  Administered 2022-05-08: 378 mg via INTRAVENOUS
  Filled 2022-05-08: qty 18

## 2022-05-08 MED ORDER — SODIUM CHLORIDE 0.9 % IV SOLN: Freq: Once | INTRAVENOUS | Status: AC

## 2022-05-08 MED ORDER — ACETAMINOPHEN 325 MG PO TABS
650.0000 mg | ORAL_TABLET | Freq: Once | ORAL | Status: AC
  Administered 2022-05-08: 650 mg via ORAL
  Filled 2022-05-08: qty 2

## 2022-05-08 MED ORDER — HEPARIN SOD (PORK) LOCK FLUSH 100 UNIT/ML IV SOLN
500.0000 [IU] | Freq: Once | INTRAVENOUS | Status: AC | PRN
  Administered 2022-05-08: 500 [IU]

## 2022-05-08 NOTE — Patient Instructions (Signed)
[facility]  Discharge Instructions: Thank you for choosing [facility] to provide your oncology and hematology care.   If you have a lab appointment with the Cancer Center, please go directly to the Cancer Center and check in at the registration area.   Wear comfortable clothing and clothing appropriate for easy access to any Portacath or PICC line.   We strive to give you quality time with your provider. You may need to reschedule your appointment if you arrive late (15 or more minutes).  Arriving late affects you and other patients whose appointments are after yours.  Also, if you miss three or more appointments without notifying the office, you may be dismissed from the clinic at the provider's discretion.      For prescription refill requests, have your pharmacy contact our office and allow 72 hours for refills to be completed.    Today you received the following chemotherapy and/or immunotherapy agents: [id number]      To help prevent nausea and vomiting after your treatment, we encourage you to take your nausea medication as directed.  BELOW ARE SYMPTOMS THAT SHOULD BE REPORTED IMMEDIATELY: *FEVER GREATER THAN 100.4 F (38 C) OR HIGHER *CHILLS OR SWEATING *NAUSEA AND VOMITING THAT IS NOT CONTROLLED WITH YOUR NAUSEA MEDICATION *UNUSUAL SHORTNESS OF BREATH *UNUSUAL BRUISING OR BLEEDING *URINARY PROBLEMS (pain or burning when urinating, or frequent urination) *BOWEL PROBLEMS (unusual diarrhea, constipation, pain near the anus) TENDERNESS IN MOUTH AND THROAT WITH OR WITHOUT PRESENCE OF ULCERS (sore throat, sores in mouth, or a toothache) UNUSUAL RASH, SWELLING OR PAIN  UNUSUAL VAGINAL DISCHARGE OR ITCHING   Items with * indicate a potential emergency and should be followed up as soon as possible or go to the Emergency Department if any problems should occur.  Please show the CHEMOTHERAPY ALERT CARD or IMMUNOTHERAPY ALERT CARD  at check-in to the Emergency Department and triage nurse.  Should you have questions after your visit or need to cancel or reschedule your appointment, please contact [facility]  Dept: [phone]  and follow the prompts.  Office hours are 8:00 a.m. to 4:30 p.m. Monday - Friday. Please note that voicemails left after 4:00 p.m. may not be returned until the following business day.  We are closed weekends and major holidays. You have access to a nurse at all times for urgent questions. Please call the main number to the clinic Dept: [phone] and follow the prompts.   For any non-urgent questions, you may also contact your provider using MyChart. We now offer e-Visits for anyone 18 and older to request care online for non-urgent symptoms. For details visit [email or web address].   Also download the MyChart app! Go to the app store, search "MyChart", open the app, select [facility], and log in with your MyChart username and password.   

## 2022-05-09 ENCOUNTER — Ambulatory Visit (HOSPITAL_COMMUNITY)
Admission: RE | Admit: 2022-05-09 | Discharge: 2022-05-09 | Disposition: A | Discharge status: Home / Self Care | Payer: No Typology Code available for payment source | Source: Ambulatory Visit | Attending: Hematology and Oncology | Admitting: Hematology and Oncology

## 2022-05-09 DIAGNOSIS — I119 Hypertensive heart disease without heart failure: Secondary | ICD-10-CM | POA: Diagnosis not present

## 2022-05-09 DIAGNOSIS — Z170 Estrogen receptor positive status [ER+]: Secondary | ICD-10-CM | POA: Insufficient documentation

## 2022-05-09 DIAGNOSIS — Z0189 Encounter for other specified special examinations: Secondary | ICD-10-CM

## 2022-05-09 DIAGNOSIS — Z5181 Encounter for therapeutic drug level monitoring: Secondary | ICD-10-CM | POA: Diagnosis present

## 2022-05-09 DIAGNOSIS — I700 Atherosclerosis of aorta: Secondary | ICD-10-CM | POA: Diagnosis not present

## 2022-05-09 DIAGNOSIS — I251 Atherosclerotic heart disease 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: Secondary | ICD-10-CM | POA: Insufficient documentation

## 2022-05-09 DIAGNOSIS — C50412 Malignant neoplasm of upper-outer quadrant of left female breast: Secondary | ICD-10-CM | POA: Insufficient documentation

## 2022-05-09 LAB — ECHOCARDIOGRAM COMPLETE
Area-P 1/2: 3.12 cm2
Calc EF: 69 %
S' Lateral: 2.3 cm
Single Plane A2C EF: 64.2 %
Single Plane A4C EF: 78.4 %

## 2022-05-09 NOTE — Progress Notes (Signed)
  Echocardiogram 2D Echocardiogram has been performed.  Janalyn Harder 05/09/2022, 12:04 PM

## 2022-05-10 ENCOUNTER — Other Ambulatory Visit (HOSPITAL_BASED_OUTPATIENT_CLINIC_OR_DEPARTMENT_OTHER): Payer: Self-pay | Admitting: Cardiology

## 2022-05-10 DIAGNOSIS — I10 Essential (primary) hypertension: Secondary | ICD-10-CM

## 2022-05-12 NOTE — Telephone Encounter (Signed)
Rx request sent to pharmacy.  

## 2022-05-26 ENCOUNTER — Ambulatory Visit: Payer: No Typology Code available for payment source | Attending: General Surgery

## 2022-05-26 VITALS — Wt 137.2 lb

## 2022-05-26 DIAGNOSIS — Z483 Aftercare following surgery for neoplasm: Secondary | ICD-10-CM | POA: Insufficient documentation

## 2022-05-26 NOTE — Therapy (Signed)
OUTPATIENT PHYSICAL THERAPY [facility] SCREENING NOTE   Patient Name: Christine [name] MRN: [id number] DOB:[birth year], 62 y.o., female Today's Date: 05/26/2022  PCP: Thana Ates, MD REFERRING PROVIDER: Griselda Miner, MD   PT End of Session - 05/26/22 [phone]     Visit Number 3   # unchanged due to screen only   PT Start Time 0831    PT Stop Time 0836    PT Time Calculation (min) 5 min    Activity Tolerance Patient tolerated treatment well    Behavior During Therapy [facility] for tasks assessed/performed             Past Medical History:  Diagnosis Date   Family history of breast cancer 07/31/2021   GERD (gastroesophageal reflux disease)    Hypertension    Past Surgical History:  Procedure Laterality Date   BREAST LUMPECTOMY WITH RADIOACTIVE SEED AND SENTINEL LYMPH NODE BIOPSY Left 08/09/2021   Procedure: LEFT BREAST RADIOACTIVE SEED LOCALIZED LUMPECTOMY AND SENTINEL NODE BIOPSY;  Surgeon: Griselda Miner, MD;  Location: [facility];  Service: General;  Laterality: Left;  GEN & PEC BLOCK   CESAREAN SECTION     FOOT SURGERY Right    PORTACATH PLACEMENT Right 08/09/2021   Procedure: PORT PLACEMENT RIGHT  INTERNAL JUGULAR  WITH ULTRASOUND GUIDANCE;  Surgeon: Griselda Miner, MD;  Location: [facility];  Service: General;  Laterality: Right;  RIGHT INTERNAL JUGULAR PLACEMENT   Patient Active Problem List   Diagnosis Date Noted   Antineoplastic chemotherapy induced anemia 10/18/2021   Port-A-Cath in place 09/12/2021   Iron deficiency anemia 09/02/2021   Genetic testing 08/05/2021   Family history of breast cancer 07/31/2021   Atherosclerotic heart disease 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 07/31/2021   Easy bruising 07/31/2021   Essential hypertension 07/31/2021   Hearing loss in left ear 07/31/2021   Insomnia 07/31/2021   Mixed hyperlipidemia 07/31/2021   Other long term (current) drug therapy 07/31/2021   Sleep disorder 07/31/2021   Tendency toward bleeding easily 07/31/2021    Vitamin D deficiency 07/31/2021   Malignant neoplasm of upper-outer quadrant of left breast in female, estrogen receptor positive 07/25/2021   Abnormal finding on mammography 07/10/2021   GERD (gastroesophageal reflux disease) 08/31/2019   Globus pharyngeus 08/02/2019   Rhinitis, chronic 08/02/2019   Elevated blood pressure reading 01/26/2019   Acute pain of right knee 10/26/2017    REFERRING DIAG: left breast cancer at risk for lymphedema  THERAPY DIAG: Aftercare following surgery for neoplasm  PERTINENT HISTORY: Patient was diagnosed on 07/08/2021 with left grade 3 invasive ductal carcinoma breast cancer. It measures 7 mm and is located in the upper outer quadrant. It is ER positive, PR negative, and HER2 positive with a Ki67 of 35%. Left lumpectomy and SLNB on 08/09/21 with port placement and 3 negative lymph nodes. Underwent chemotherapy with taxol and herceptin.   PRECAUTIONS: left UE Lymphedema risk  SUBJECTIVE: Patient returns for her 3 month L-Dex screen.   PAIN:  Are you having pain? No  LYMPHEDEMA ASSESSMENTS:    Patient was assessed today using the SOZO machine to determine the lymphedema index score. This was compared to her baseline score. It was determined that she is within the recommended range when compared to her baseline and no further action is needed at this time. She will continue SOZO screenings. These are done every 3 months for 2 years post operatively followed by every 6 months for 2 years, and then annually.  Issued script as pt would like to get a compression sleeve at this time.      L-DEX FLOWSHEETS - 05/26/22 0800       L-DEX LYMPHEDEMA SCREENING   Measurement Type Unilateral    L-DEX MEASUREMENT EXTREMITY Upper Extremity    POSITION  Standing    DOMINANT SIDE Right    At Risk Side Left    BASELINE SCORE (UNILATERAL) -2.1    L-DEX SCORE (UNILATERAL) -3.3    VALUE CHANGE (UNILAT) -1.2                Berna Spare, PTA 05/26/22 8:36  AM

## 2022-05-27 ENCOUNTER — Ambulatory Visit
Admission: RE | Admit: 2022-05-27 | Discharge: 2022-05-27 | Disposition: A | Discharge status: Home / Self Care | Payer: No Typology Code available for payment source | Source: Ambulatory Visit | Attending: Internal Medicine | Admitting: Internal Medicine

## 2022-05-27 ENCOUNTER — Other Ambulatory Visit: Payer: Self-pay

## 2022-05-27 DIAGNOSIS — M810 Age-related osteoporosis without current pathological fracture: Secondary | ICD-10-CM

## 2022-05-29 ENCOUNTER — Inpatient Hospital Stay: Payer: No Typology Code available for payment source

## 2022-05-30 ENCOUNTER — Other Ambulatory Visit: Payer: Self-pay

## 2022-05-30 ENCOUNTER — Inpatient Hospital Stay: Payer: No Typology Code available for payment source

## 2022-05-30 ENCOUNTER — Inpatient Hospital Stay (HOSPITAL_BASED_OUTPATIENT_CLINIC_OR_DEPARTMENT_OTHER): Payer: No Typology Code available for payment source | Admitting: Hematology and Oncology

## 2022-05-30 ENCOUNTER — Other Ambulatory Visit: Payer: Self-pay | Admitting: Hematology and Oncology

## 2022-05-30 VITALS — BP 101/43 | HR 58 | Temp 98.0°F | Resp 16 | Wt 140.8 lb

## 2022-05-30 DIAGNOSIS — Z5112 Encounter for antineoplastic immunotherapy: Secondary | ICD-10-CM | POA: Diagnosis not present

## 2022-05-30 DIAGNOSIS — C50412 Malignant neoplasm of upper-outer quadrant of left female breast: Secondary | ICD-10-CM | POA: Diagnosis not present

## 2022-05-30 DIAGNOSIS — Z170 Estrogen receptor positive status [ER+]: Secondary | ICD-10-CM

## 2022-05-30 MED ORDER — SODIUM CHLORIDE 0.9% FLUSH
10.0000 mL | INTRAVENOUS | Status: DC | PRN
  Administered 2022-05-30: 10 mL

## 2022-05-30 MED ORDER — HEPARIN SOD (PORK) LOCK FLUSH 100 UNIT/ML IV SOLN
500.0000 [IU] | Freq: Once | INTRAVENOUS | Status: AC | PRN
  Administered 2022-05-30: 500 [IU]

## 2022-05-30 MED ORDER — DIPHENHYDRAMINE HCL 25 MG PO CAPS
25.0000 mg | ORAL_CAPSULE | Freq: Once | ORAL | Status: AC
  Administered 2022-05-30: 25 mg via ORAL
  Filled 2022-05-30: qty 1

## 2022-05-30 MED ORDER — TRASTUZUMAB-DKST CHEMO 150 MG IV SOLR
6.0000 mg/kg | Freq: Once | INTRAVENOUS | Status: AC
  Administered 2022-05-30: 420 mg via INTRAVENOUS
  Filled 2022-05-30: qty 20

## 2022-05-30 MED ORDER — ACETAMINOPHEN 325 MG PO TABS
650.0000 mg | ORAL_TABLET | Freq: Once | ORAL | Status: AC
  Administered 2022-05-30: 650 mg via ORAL
  Filled 2022-05-30: qty 2

## 2022-05-30 MED ORDER — SODIUM CHLORIDE 0.9 % IV SOLN: Freq: Once | INTRAVENOUS | Status: AC

## 2022-05-30 NOTE — Assessment & Plan Note (Addendum)
Christine [name] is a 62 year old woman with stage Ia ER positive, PR negative, Her 2 amplified breast cancer status postlumpectomy and currently undergoing adjuvant chemotherapy with Taxol and Herceptin.  She is now status post adjuvant Herceptin and Taxol.  She is on Herceptin and Tamoxifen. She is tolerating treatment very well. She denies any adverse effects overall. Neuropathy is improving, nasal issues are improving No concerns on exam Herceptin every 3 weeks, FU every 6 weeks. Mammogram ordered for end of July She will need repeat ECHO in July  Rachel Moulds MD

## 2022-05-30 NOTE — Progress Notes (Signed)
[facility] Cancer Follow up:    Christine Ates, MD [address] [place] [address]   DIAGNOSIS:  Cancer Staging  Malignant neoplasm of upper-outer quadrant of left breast in female, estrogen receptor positive (HCC) Staging form: Breast, AJCC 8th Edition - Clinical stage from 07/29/2021: Stage IA (cT1c, cN0, cM0, G3, ER+, PR-, HER2+) - Signed by Ronny Bacon, PA-C on 07/31/2021 Stage prefix: Initial diagnosis Method of lymph node assessment: Clinical Histologic grading system: 3 grade system   SUMMARY OF ONCOLOGIC HISTORY: Oncology History  Malignant neoplasm of upper-outer quadrant of left breast in female, estrogen receptor positive (HCC)  07/17/2021 Mammogram   Suspicious left breast mass at 3 o'clock, 4 cm from the nipple. No axillary adenopathy. Targeted ultrasound is performed, showing an irregular centrally hypoechoic mass with an echogenic rim measuring 7 x 5 by 7 mm, thought to correlate with the mammographically identified mass. This mass is located at 3 o'clock, 4 cm from the nipple. No axillary adenopathy.   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female, estrogen receptor positive (HCC)   07/29/2021 Cancer Staging   Staging form: Breast, AJCC 8th Edition - Clinical stage from 07/29/2021: Stage IA (cT1c, cN0, cM0, G3, ER+, PR-, HER2+) - Signed by Ronny Bacon, PA-C on 07/31/2021 Stage prefix: Initial diagnosis Method of lymph node assessment: Clinical Histologic grading system: 3 grade system    Pathology Results   Path showed IDC, grade 3, ER 100% positive, strong staining intensity, PR negative, ki 67 35%, Her 2 positive   08/05/2021 Genetic Testing   Negative hereditary cancer genetic testing: no pathogenic variants detected in [facility] BRCAPlus Panel and [facility] CustomNext-Cancer +RNAinsight Panel.  Report dates are August 05, 2021 and August 08, 2021.    The BRCAplus panel  offered by [employer] and includes sequencing and deletion/duplication analysis for the following 8 genes: ATM, BRCA1, BRCA2, CDH1, CHEK2, PALB2, PTEN, and TP53. The CustomNext-Cancer+RNAinsight panel offered by [employer] includes sequencing and rearrangement analysis for the following 47 genes:  APC, ATM, AXIN2, BARD1, BMPR1A, BRCA1, BRCA2, BRIP1, CDH1, CDK4, CDKN2A, CHEK2, DICER1, EPCAM, GREM1, HOXB13, MEN1, MLH1, MSH2, MSH3, MSH6, MUTYH, NBN, NF1, NF2, NTHL1, PALB2, PMS2, POLD1, POLE, PTEN, RAD51C, RAD51D, RECQL, RET, SDHA, SDHAF2, SDHB, SDHC, SDHD, SMAD4, SMARCA4, STK11, TP53, TSC1, TSC2, and VHL.  RNA data is routinely analyzed for use in variant interpretation for all genes.   08/09/2021 Surgery   Left lumpectomy: IDC, grade 3, 3 sentinel lymph nodes all negative for cancer.  He has biomarker testing ER positive, PR negative, HER2 positive.  pT1c, PN0.   09/12/2021 -  Adjuvant Chemotherapy   Taxol/Herceptin weekly x12, followed by Herceptin given every 3 weeks to complete 1 year of treatment.     CURRENT THERAPY: Herceptin; Tamoxifen daily  INTERVAL HISTORY:  Christine [name] 62 y.o. female returns for evaluation prior to receiving Herceptin therapy.  Her most recent echocardiogram in April with EF of 60-65%  She is now on adjuvant Tamoxifen. She is tolerating this very well.  She is also on herceptin adjuvant, tolerating it well. She had bone denisty scan recently which showed osteoporosis, she is going to see her PCP and more than likely may be sent to endocrinology she says. 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 It continues to get better.  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   Port-A-Cath in place 09/12/2021   Iron deficiency anemia 09/02/2021   Genetic testing 08/05/2021   Family history of breast cancer 07/31/2021    Atherosclerotic heart disease 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 07/31/2021   Easy bruising 07/31/2021   Essential hypertension 07/31/2021   Hearing loss in left ear 07/31/2021   Insomnia 07/31/2021   Mixed hyperlipidemia 07/31/2021   Other long term (current) drug therapy 07/31/2021   Sleep disorder 07/31/2021   Tendency toward bleeding easily (HCC) 07/31/2021   Vitamin D deficiency 07/31/2021   Malignant neoplasm of upper-outer quadrant of left breast in female, estrogen receptor positive (HCC) 07/25/2021   Abnormal finding on mammography 07/10/2021   GERD (gastroesophageal reflux disease) 08/31/2019   Globus pharyngeus 08/02/2019   Rhinitis, chronic 08/02/2019   Elevated blood pressure reading 01/26/2019   Acute pain of right knee 10/26/2017    is allergic to lisinopril and mefloquine.  MEDICAL HISTORY: Past Medical History:  Diagnosis Date   Family history of breast cancer 07/31/2021   GERD (gastroesophageal reflux disease)    Hypertension     SURGICAL HISTORY: Past Surgical History:  Procedure Laterality Date   BREAST LUMPECTOMY WITH RADIOACTIVE SEED AND SENTINEL LYMPH NODE BIOPSY Left 08/09/2021   Procedure: LEFT BREAST RADIOACTIVE SEED LOCALIZED LUMPECTOMY AND SENTINEL NODE BIOPSY;  Surgeon: Griselda Miner, MD;  Location: [facility] OR;  Service: General;  Laterality: Left;  GEN & PEC BLOCK   CESAREAN SECTION     FOOT SURGERY Right    PORTACATH PLACEMENT Right 08/09/2021   Procedure: PORT PLACEMENT RIGHT  INTERNAL JUGULAR  WITH ULTRASOUND GUIDANCE;  Surgeon: Griselda Miner, MD;  Location: [facility] OR;  Service: General;  Laterality: Right;  RIGHT INTERNAL JUGULAR PLACEMENT    SOCIAL HISTORY: Social History   Socioeconomic History   Marital status: Married    Spouse name: Not on file   Number of children: Not on file   Years of education: Not on file   Highest education level: Not on file  Occupational History   Not on file  Tobacco Use   Smoking status: Never    Smokeless tobacco: Never  Vaping Use   Vaping Use: Never used  Substance and Sexual Activity   Alcohol use: Yes    Alcohol/week: 2.0 standard drinks of alcohol    Types: 2 Glasses of wine per week   Drug use: Never   Sexual activity: Not on file  Other Topics Concern   Not on file  Social History Narrative   Not on file   Social Determinants of Health   Financial Resource Strain: Not on file  Food Insecurity: Not on file  Transportation Needs: Not on file  Physical Activity: Not on file  Stress: Not on file  Social Connections: Not on file  Intimate Partner Violence: Not on file    FAMILY HISTORY: Family History  Problem Relation Age of Onset   Heart disease Father    Breast cancer Sister 45   Esophageal cancer Maternal Uncle        dx after age 53   Lung cancer Paternal Uncle    Skin cancer Paternal Uncle     Review of Systems  Constitutional:  Negative for appetite change, chills, fatigue, fever and unexpected weight change.  HENT:   Negative for hearing loss, lump/mass and trouble swallowing.   Eyes:  Negative for eye problems and icterus.  Respiratory:  Negative for chest  tightness, cough and shortness of breath.   Cardiovascular:  Negative for chest pain, leg swelling and palpitations.  Gastrointestinal:  Negative for abdominal distention, abdominal pain, constipation, diarrhea, nausea and vomiting.  Endocrine: Negative for hot flashes.  Genitourinary:  Negative for difficulty urinating.   Musculoskeletal:  Negative for arthralgias.  Skin:  Negative for itching and rash.  Neurological:  Negative for dizziness, extremity weakness, headaches and numbness.  Hematological:  Negative for adenopathy. Does not bruise/bleed easily.  Psychiatric/Behavioral:  Negative for depression. The patient is not nervous/anxious.       PHYSICAL EXAMINATION  ECOG PERFORMANCE STATUS: 1 - Symptomatic but completely ambulatory  There were no vitals filed for this  visit.   Physical Exam Constitutional:      General: She is not in acute distress.    Appearance: Normal appearance. She is not toxic-appearing.  HENT:     Head: Normocephalic and atraumatic.  Eyes:     General: No scleral icterus. Cardiovascular:     Rate and Rhythm: Normal rate and regular rhythm.     Pulses: Normal pulses.     Heart sounds: Normal heart sounds.  Pulmonary:     Effort: Pulmonary effort is normal.     Breath sounds: Normal breath sounds.  Abdominal:     General: Abdomen is flat. Bowel sounds are normal. There is no distension.     Palpations: Abdomen is soft.     Tenderness: There is no abdominal tenderness.  Musculoskeletal:        General: No swelling.     Cervical back: Neck supple.  Lymphadenopathy:     Cervical: No cervical adenopathy.  Skin:    General: Skin is warm and dry.     Findings: No rash.  Neurological:     General: No focal deficit present.     Mental Status: She is alert.  Psychiatric:        Mood and Affect: Mood normal.        Behavior: Behavior normal.     LABORATORY DATA:  CBC    Component Value Date/Time   WBC 4.6 04/17/2022 0850   WBC 6.8 06/16/2019 1456   RBC 3.60 (L) 04/17/2022 0850   HGB 10.9 (L) 04/17/2022 0850   HCT 31.9 (L) 04/17/2022 0850   PLT 193 04/17/2022 0850   MCV 88.6 04/17/2022 0850   MCH 30.3 04/17/2022 0850   MCHC 34.2 04/17/2022 0850   RDW 12.9 04/17/2022 0850   LYMPHSABS 1.4 04/17/2022 0850   MONOABS 0.4 04/17/2022 0850   EOSABS 0.2 04/17/2022 0850   BASOSABS 0.1 04/17/2022 0850    CMP     Component Value Date/Time   NA 140 04/17/2022 0850   NA 142 10/31/2019 1545   K 3.7 04/17/2022 0850   CL 107 04/17/2022 0850   CO2 29 04/17/2022 0850   GLUCOSE 100 (H) 04/17/2022 0850   BUN 18 04/17/2022 0850   BUN 11 10/31/2019 1545   CREATININE 0.73 04/17/2022 0850   CALCIUM 8.6 (L) 04/17/2022 0850   PROT 6.3 (L) 04/17/2022 0850   PROT 6.7 08/01/2020 1004   ALBUMIN 3.9 04/17/2022 0850   ALBUMIN  4.5 08/01/2020 1004   AST 24 04/17/2022 0850   ALT 20 04/17/2022 0850   ALKPHOS 38 04/17/2022 0850   BILITOT 0.8 04/17/2022 0850   GFRNONAA >60 04/17/2022 0850   GFRAA 104 10/31/2019 1545      ASSESSMENT and THERAPY PLAN:   Malignant neoplasm of upper-outer quadrant of left breast in  female, estrogen receptor positive (HCC) Tashari is a 62 year old woman with stage Ia ER positive, PR negative, Her 2 amplified breast cancer status postlumpectomy and currently undergoing adjuvant chemotherapy with Taxol and Herceptin.  She is now status post adjuvant Herceptin and Taxol.  She is on Herceptin and Tamoxifen. She is tolerating treatment very well. She denies any adverse effects overall. Neuropathy is improving, nasal issues are improving No concerns on exam Herceptin every 3 weeks, FU every 6 weeks. Mammogram ordered for end of July She will need repeat ECHO in July  Burnice Logan Pinchas Reither MD   All questions were answered. The patient knows to call the clinic with any problems, questions or concerns. We can certainly see the patient much sooner if necessary. This note was electronically signed.  Total encounter time:30 minutes*in face-to-face visit time, chart review, lab review, care coordination, order entry, and documentation of the encounter time.  *Total Encounter Time as defined by the Centers for Medicare and Medicaid Services includes, in addition to the face-to-face time of a patient visit (documented in the note above) non-face-to-face time: obtaining and reviewing outside history, ordering and reviewing medications, tests or procedures, care coordination (communications with other health care professionals or caregivers) and documentation in the medical record.

## 2022-05-30 NOTE — Patient Instructions (Signed)
[facility]  Discharge Instructions: Thank you for choosing [facility] to provide your oncology and hematology care.   If you have a lab appointment with the Cancer Center, please go directly to the Cancer Center and check in at the registration area.   Wear comfortable clothing and clothing appropriate for easy access to any Portacath or PICC line.   We strive to give you quality time with your provider. You may need to reschedule your appointment if you arrive late (15 or more minutes).  Arriving late affects you and other patients whose appointments are after yours.  Also, if you miss three or more appointments without notifying the office, you may be dismissed from the clinic at the provider's discretion.      For prescription refill requests, have your pharmacy contact our office and allow 72 hours for refills to be completed.    Today you received the following chemotherapy and/or immunotherapy agents: [id number]      To help prevent nausea and vomiting after your treatment, we encourage you to take your nausea medication as directed.  BELOW ARE SYMPTOMS THAT SHOULD BE REPORTED IMMEDIATELY: *FEVER GREATER THAN 100.4 F (38 C) OR HIGHER *CHILLS OR SWEATING *NAUSEA AND VOMITING THAT IS NOT CONTROLLED WITH YOUR NAUSEA MEDICATION *UNUSUAL SHORTNESS OF BREATH *UNUSUAL BRUISING OR BLEEDING *URINARY PROBLEMS (pain or burning when urinating, or frequent urination) *BOWEL PROBLEMS (unusual diarrhea, constipation, pain near the anus) TENDERNESS IN MOUTH AND THROAT WITH OR WITHOUT PRESENCE OF ULCERS (sore throat, sores in mouth, or a toothache) UNUSUAL RASH, SWELLING OR PAIN  UNUSUAL VAGINAL DISCHARGE OR ITCHING   Items with * indicate a potential emergency and should be followed up as soon as possible or go to the Emergency Department if any problems should occur.  Please show the CHEMOTHERAPY ALERT CARD or IMMUNOTHERAPY ALERT CARD  at check-in to the Emergency Department and triage nurse.  Should you have questions after your visit or need to cancel or reschedule your appointment, please contact [facility]  Dept: [phone]  and follow the prompts.  Office hours are 8:00 a.m. to 4:30 p.m. Monday - Friday. Please note that voicemails left after 4:00 p.m. may not be returned until the following business day.  We are closed weekends and major holidays. You have access to a nurse at all times for urgent questions. Please call the main number to the clinic Dept: [phone] and follow the prompts.   For any non-urgent questions, you may also contact your provider using MyChart. We now offer e-Visits for anyone 18 and older to request care online for non-urgent symptoms. For details visit [email or web address].   Also download the MyChart app! Go to the app store, search "MyChart", open the app, select [facility], and log in with your MyChart username and password.   

## 2022-06-11 ENCOUNTER — Encounter: Payer: Self-pay | Admitting: Hematology and Oncology

## 2022-06-12 ENCOUNTER — Encounter: Payer: Self-pay | Admitting: Hematology and Oncology

## 2022-06-17 ENCOUNTER — Encounter: Payer: Self-pay | Admitting: Hematology and Oncology

## 2022-06-19 ENCOUNTER — Inpatient Hospital Stay: Payer: No Typology Code available for payment source

## 2022-06-19 ENCOUNTER — Inpatient Hospital Stay: Payer: No Typology Code available for payment source | Attending: Hematology and Oncology

## 2022-06-19 VITALS — BP 109/63 | HR 63 | Temp 99.1°F | Resp 17 | Wt 138.2 lb

## 2022-06-19 DIAGNOSIS — Z5112 Encounter for antineoplastic immunotherapy: Secondary | ICD-10-CM | POA: Insufficient documentation

## 2022-06-19 DIAGNOSIS — Z170 Estrogen receptor positive status [ER+]: Secondary | ICD-10-CM | POA: Insufficient documentation

## 2022-06-19 DIAGNOSIS — C50412 Malignant neoplasm of upper-outer quadrant of left female breast: Secondary | ICD-10-CM | POA: Insufficient documentation

## 2022-06-19 MED ORDER — SODIUM CHLORIDE 0.9% FLUSH
10.0000 mL | INTRAVENOUS | Status: DC | PRN
  Administered 2022-06-19: 10 mL

## 2022-06-19 MED ORDER — TRASTUZUMAB-DKST CHEMO 150 MG IV SOLR
378.0000 mg | Freq: Once | INTRAVENOUS | Status: AC
  Administered 2022-06-19: 378 mg via INTRAVENOUS
  Filled 2022-06-19: qty 18

## 2022-06-19 MED ORDER — HEPARIN SOD (PORK) LOCK FLUSH 100 UNIT/ML IV SOLN
500.0000 [IU] | Freq: Once | INTRAVENOUS | Status: AC | PRN
  Administered 2022-06-19: 500 [IU]

## 2022-06-19 MED ORDER — DIPHENHYDRAMINE HCL 25 MG PO CAPS
25.0000 mg | ORAL_CAPSULE | Freq: Once | ORAL | Status: AC
  Administered 2022-06-19: 25 mg via ORAL
  Filled 2022-06-19: qty 1

## 2022-06-19 MED ORDER — SODIUM CHLORIDE 0.9 % IV SOLN: Freq: Once | INTRAVENOUS | Status: AC

## 2022-06-19 MED ORDER — ACETAMINOPHEN 325 MG PO TABS
650.0000 mg | ORAL_TABLET | Freq: Once | ORAL | Status: AC
  Administered 2022-06-19: 650 mg via ORAL
  Filled 2022-06-19: qty 2

## 2022-07-10 ENCOUNTER — Inpatient Hospital Stay: Payer: No Typology Code available for payment source

## 2022-07-10 ENCOUNTER — Other Ambulatory Visit: Payer: Self-pay

## 2022-07-10 ENCOUNTER — Inpatient Hospital Stay: Payer: No Typology Code available for payment source | Attending: Hematology and Oncology

## 2022-07-10 ENCOUNTER — Inpatient Hospital Stay (HOSPITAL_BASED_OUTPATIENT_CLINIC_OR_DEPARTMENT_OTHER): Payer: No Typology Code available for payment source | Admitting: Hematology and Oncology

## 2022-07-10 ENCOUNTER — Other Ambulatory Visit: Payer: Self-pay | Admitting: *Deleted

## 2022-07-10 VITALS — BP 134/63 | HR 67 | Temp 97.7°F | Resp 16 | Wt 136.8 lb

## 2022-07-10 VITALS — BP 101/62 | HR 63

## 2022-07-10 DIAGNOSIS — D6481 Anemia due to antineoplastic chemotherapy: Secondary | ICD-10-CM | POA: Diagnosis not present

## 2022-07-10 DIAGNOSIS — G629 Polyneuropathy, unspecified: Secondary | ICD-10-CM | POA: Insufficient documentation

## 2022-07-10 DIAGNOSIS — C50412 Malignant neoplasm of upper-outer quadrant of left female breast: Secondary | ICD-10-CM | POA: Diagnosis present

## 2022-07-10 DIAGNOSIS — I10 Essential (primary) hypertension: Secondary | ICD-10-CM | POA: Diagnosis not present

## 2022-07-10 DIAGNOSIS — D509 Iron deficiency anemia, unspecified: Secondary | ICD-10-CM | POA: Insufficient documentation

## 2022-07-10 DIAGNOSIS — Z95828 Presence of other vascular implants and grafts: Secondary | ICD-10-CM

## 2022-07-10 DIAGNOSIS — Z803 Family history of malignant neoplasm of breast: Secondary | ICD-10-CM | POA: Diagnosis not present

## 2022-07-10 DIAGNOSIS — K219 Gastro-esophageal reflux disease without esophagitis: Secondary | ICD-10-CM | POA: Insufficient documentation

## 2022-07-10 DIAGNOSIS — Z170 Estrogen receptor positive status [ER+]: Secondary | ICD-10-CM

## 2022-07-10 DIAGNOSIS — E559 Vitamin D deficiency, unspecified: Secondary | ICD-10-CM | POA: Diagnosis not present

## 2022-07-10 DIAGNOSIS — Z801 Family history of malignant neoplasm of trachea, bronchus and lung: Secondary | ICD-10-CM | POA: Diagnosis not present

## 2022-07-10 DIAGNOSIS — G4700 Insomnia, unspecified: Secondary | ICD-10-CM | POA: Diagnosis not present

## 2022-07-10 DIAGNOSIS — E782 Mixed hyperlipidemia: Secondary | ICD-10-CM | POA: Diagnosis not present

## 2022-07-10 DIAGNOSIS — M810 Age-related osteoporosis without current pathological fracture: Secondary | ICD-10-CM | POA: Diagnosis not present

## 2022-07-10 DIAGNOSIS — I251 Atherosclerotic heart disease 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: Secondary | ICD-10-CM | POA: Insufficient documentation

## 2022-07-10 DIAGNOSIS — Z5189 Encounter for other specified aftercare: Secondary | ICD-10-CM | POA: Insufficient documentation

## 2022-07-10 DIAGNOSIS — Z79810 Long term (current) use of selective estrogen receptor modulators (SERMs): Secondary | ICD-10-CM | POA: Diagnosis not present

## 2022-07-10 LAB — CBC WITH DIFFERENTIAL (CANCER CENTER ONLY)
Abs Immature Granulocytes: 0.02 10*3/uL (ref 0.00–0.07)
Basophils Absolute: 0 10*3/uL (ref 0.0–0.1)
Basophils Relative: 1 %
Eosinophils Absolute: 0.1 10*3/uL (ref 0.0–0.5)
Eosinophils Relative: 2 %
HCT: 33.5 % — ABNORMAL LOW (ref 36.0–46.0)
Hemoglobin: 11.4 g/dL — ABNORMAL LOW (ref 12.0–15.0)
Immature Granulocytes: 0 %
Lymphocytes Relative: 11 %
Lymphs Abs: 0.7 10*3/uL (ref 0.7–4.0)
MCH: 30.8 pg (ref 26.0–34.0)
MCHC: 34 g/dL (ref 30.0–36.0)
MCV: 90.5 fL (ref 80.0–100.0)
Monocytes Absolute: 0.4 10*3/uL (ref 0.1–1.0)
Monocytes Relative: 6 %
Neutro Abs: 5.3 10*3/uL (ref 1.7–7.7)
Neutrophils Relative %: 80 %
Platelet Count: 220 10*3/uL (ref 150–400)
RBC: 3.7 MIL/uL — ABNORMAL LOW (ref 3.87–5.11)
RDW: 12.3 % (ref 11.5–15.5)
WBC Count: 6.6 10*3/uL (ref 4.0–10.5)
nRBC: 0 % (ref 0.0–0.2)

## 2022-07-10 LAB — CMP (CANCER CENTER ONLY)
ALT: 28 U/L (ref 0–44)
AST: 25 U/L (ref 15–41)
Albumin: 4 g/dL (ref 3.5–5.0)
Alkaline Phosphatase: 35 U/L — ABNORMAL LOW (ref 38–126)
Anion gap: 4 — ABNORMAL LOW (ref 5–15)
BUN: 22 mg/dL (ref 8–23)
CO2: 28 mmol/L (ref 22–32)
Calcium: 9.1 mg/dL (ref 8.9–10.3)
Chloride: 106 mmol/L (ref 98–111)
Creatinine: 0.79 mg/dL (ref 0.44–1.00)
GFR, Estimated: >60 mL/min (ref >60)
Glucose, Bld: 100 mg/dL — ABNORMAL HIGH (ref 70–99)
Potassium: 4 mmol/L (ref 3.5–5.1)
Sodium: 138 mmol/L (ref 135–145)
Total Bilirubin: 0.9 mg/dL (ref 0.3–1.2)
Total Protein: 6.5 g/dL (ref 6.5–8.1)

## 2022-07-10 MED ORDER — DIPHENHYDRAMINE HCL 25 MG PO CAPS
25.0000 mg | ORAL_CAPSULE | Freq: Once | ORAL | Status: AC
  Administered 2022-07-10: 25 mg via ORAL
  Filled 2022-07-10: qty 1

## 2022-07-10 MED ORDER — SODIUM CHLORIDE 0.9% FLUSH
10.0000 mL | Freq: Once | INTRAVENOUS | Status: AC
  Administered 2022-07-10: 10 mL

## 2022-07-10 MED ORDER — TRASTUZUMAB-DKST CHEMO 150 MG IV SOLR
378.0000 mg | Freq: Once | INTRAVENOUS | Status: AC
  Administered 2022-07-10: 378 mg via INTRAVENOUS
  Filled 2022-07-10: qty 18

## 2022-07-10 MED ORDER — ACETAMINOPHEN 325 MG PO TABS
650.0000 mg | ORAL_TABLET | Freq: Once | ORAL | Status: AC
  Administered 2022-07-10: 650 mg via ORAL
  Filled 2022-07-10: qty 2

## 2022-07-10 MED ORDER — HEPARIN SOD (PORK) LOCK FLUSH 100 UNIT/ML IV SOLN
500.0000 [IU] | Freq: Once | INTRAVENOUS | Status: AC | PRN
  Administered 2022-07-10: 500 [IU]

## 2022-07-10 MED ORDER — SODIUM CHLORIDE 0.9 % IV SOLN: Freq: Once | INTRAVENOUS | Status: AC

## 2022-07-10 MED ORDER — SODIUM CHLORIDE 0.9% FLUSH
10.0000 mL | INTRAVENOUS | Status: DC | PRN
  Administered 2022-07-10: 10 mL

## 2022-07-10 NOTE — Progress Notes (Signed)
[facility] Cancer Follow up:    Christine Ates, MD [address] [place] [address]   DIAGNOSIS:  Cancer Staging  Malignant neoplasm of upper-outer quadrant of left breast in female, estrogen receptor positive (HCC) Staging form: Breast, AJCC 8th Edition - Clinical stage from 07/29/2021: Stage IA (cT1c, cN0, cM0, G3, ER+, PR-, HER2+) - Signed by Ronny Bacon, PA-C on 07/31/2021 Stage prefix: Initial diagnosis Method of lymph node assessment: Clinical Histologic grading system: 3 grade system   SUMMARY OF ONCOLOGIC HISTORY: Oncology History  Malignant neoplasm of upper-outer quadrant of left breast in female, estrogen receptor positive (HCC)  07/17/2021 Mammogram   Suspicious left breast mass at 3 o'clock, 4 cm from the nipple. No axillary adenopathy. Targeted ultrasound is performed, showing an irregular centrally hypoechoic mass with an echogenic rim measuring 7 x 5 by 7 mm, thought to correlate with the mammographically identified mass. This mass is located at 3 o'clock, 4 cm from the nipple. No axillary adenopathy.   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female, estrogen receptor positive (HCC)   07/29/2021 Cancer Staging   Staging form: Breast, AJCC 8th Edition - Clinical stage from 07/29/2021: Stage IA (cT1c, cN0, cM0, G3, ER+, PR-, HER2+) - Signed by Ronny Bacon, PA-C on 07/31/2021 Stage prefix: Initial diagnosis Method of lymph node assessment: Clinical Histologic grading system: 3 grade system    Pathology Results   Path showed IDC, grade 3, ER 100% positive, strong staining intensity, PR negative, ki 67 35%, Her 2 positive   08/05/2021 Genetic Testing   Negative hereditary cancer genetic testing: no pathogenic variants detected in [facility] BRCAPlus Panel and [employer] CustomNext-Cancer +RNAinsight Panel.  Report dates are August 05, 2021 and August 08, 2021.    The BRCAplus panel  offered by [employer] and includes sequencing and deletion/duplication analysis for the following 8 genes: ATM, BRCA1, BRCA2, CDH1, CHEK2, PALB2, PTEN, and TP53. The CustomNext-Cancer+RNAinsight panel offered by [employer] includes sequencing and rearrangement analysis for the following 47 genes:  APC, ATM, AXIN2, BARD1, BMPR1A, BRCA1, BRCA2, BRIP1, CDH1, CDK4, CDKN2A, CHEK2, DICER1, EPCAM, GREM1, HOXB13, MEN1, MLH1, MSH2, MSH3, MSH6, MUTYH, NBN, NF1, NF2, NTHL1, PALB2, PMS2, POLD1, POLE, PTEN, RAD51C, RAD51D, RECQL, RET, SDHA, SDHAF2, SDHB, SDHC, SDHD, SMAD4, SMARCA4, STK11, TP53, TSC1, TSC2, and VHL.  RNA data is routinely analyzed for use in variant interpretation for all genes.   08/09/2021 Surgery   Left lumpectomy: IDC, grade 3, 3 sentinel lymph nodes all negative for cancer.  He has biomarker testing ER positive, PR negative, HER2 positive.  pT1c, PN0.   09/12/2021 -  Adjuvant Chemotherapy   Taxol/Herceptin weekly x12, followed by Herceptin given every 3 weeks to complete 1 year of treatment.     CURRENT THERAPY: Herceptin; Tamoxifen daily  INTERVAL HISTORY:  Christine [name] 62 y.o. female returns for evaluation prior to receiving Herceptin therapy.  Her most recent echocardiogram in April with EF of 60-65%  She is now on adjuvant Tamoxifen. She is tolerating this very well.  She is also on herceptin adjuvant, tolerating it well. Bone density showed osteoporosis, she has a FU with endocrinology in July. 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 She otherwise denies any new complaints. Rest of the pertinent 10 point ROS reviewed and neg  Patient Active Problem List   Diagnosis Date Noted   Antineoplastic chemotherapy induced anemia 10/18/2021   Port-A-Cath in place  09/12/2021   Iron deficiency anemia 09/02/2021   Genetic testing 08/05/2021   Family history of breast cancer 07/31/2021   Atherosclerotic heart disease of native coronary artery without  angina pectoris 07/31/2021   Easy bruising 07/31/2021   Essential hypertension 07/31/2021   Hearing loss in left ear 07/31/2021   Insomnia 07/31/2021   Mixed hyperlipidemia 07/31/2021   Other long term (current) drug therapy 07/31/2021   Sleep disorder 07/31/2021   Tendency toward bleeding easily (HCC) 07/31/2021   Vitamin D deficiency 07/31/2021   Malignant neoplasm of upper-outer quadrant of left breast in female, estrogen receptor positive (HCC) 07/25/2021   Abnormal finding on mammography 07/10/2021   GERD (gastroesophageal reflux disease) 08/31/2019   Globus pharyngeus 08/02/2019   Rhinitis, chronic 08/02/2019   Elevated blood pressure reading 01/26/2019   Acute pain of right knee 10/26/2017    is allergic to lisinopril and mefloquine.  MEDICAL HISTORY: Past Medical History:  Diagnosis Date   Family history of breast cancer 07/31/2021   GERD (gastroesophageal reflux disease)    Hypertension     SURGICAL HISTORY: Past Surgical History:  Procedure Laterality Date   BREAST LUMPECTOMY WITH RADIOACTIVE SEED AND SENTINEL LYMPH NODE BIOPSY Left 08/09/2021   Procedure: LEFT BREAST RADIOACTIVE SEED LOCALIZED LUMPECTOMY AND SENTINEL NODE BIOPSY;  Surgeon: Griselda Miner, MD;  Location: [facility] OR;  Service: General;  Laterality: Left;  GEN & PEC BLOCK   CESAREAN SECTION     FOOT SURGERY Right    PORTACATH PLACEMENT Right 08/09/2021   Procedure: PORT PLACEMENT RIGHT  INTERNAL JUGULAR  WITH ULTRASOUND GUIDANCE;  Surgeon: Griselda Miner, MD;  Location: [facility] OR;  Service: General;  Laterality: Right;  RIGHT INTERNAL JUGULAR PLACEMENT    SOCIAL HISTORY: Social History   Socioeconomic History   Marital status: Married    Spouse name: Not on file   Number of children: Not on file   Years of education: Not on file   Highest education level: Not on file  Occupational History   Not on file  Tobacco Use   Smoking status: Never   Smokeless tobacco: Never  Vaping Use   Vaping Use: Never  used  Substance and Sexual Activity   Alcohol use: Yes    Alcohol/week: 2.0 standard drinks of alcohol    Types: 2 Glasses of wine per week   Drug use: Never   Sexual activity: Not on file  Other Topics Concern   Not on file  Social History Narrative   Not on file   Social Determinants of Health   Financial Resource Strain: Not on file  Food Insecurity: Not on file  Transportation Needs: Not on file  Physical Activity: Not on file  Stress: Not on file  Social Connections: Not on file  Intimate Partner Violence: Not on file    FAMILY HISTORY: Family History  Problem Relation Age of Onset   Heart disease Father    Breast cancer Sister 43   Esophageal cancer Maternal Uncle        dx after age 64   Lung cancer Paternal Uncle    Skin cancer Paternal Uncle     Review of Systems  Constitutional:  Negative for appetite change, chills, fatigue, fever and unexpected weight change.  HENT:   Negative for hearing loss, lump/mass and trouble swallowing.   Eyes:  Negative for eye problems and icterus.  Respiratory:  Negative for chest tightness, cough and shortness of breath.   Cardiovascular:  Negative for chest pain,  leg swelling and palpitations.  Gastrointestinal:  Negative for abdominal distention, abdominal pain, constipation, diarrhea, nausea and vomiting.  Endocrine: Negative for hot flashes.  Genitourinary:  Negative for difficulty urinating.   Musculoskeletal:  Negative for arthralgias.  Skin:  Negative for itching and rash.  Neurological:  Negative for dizziness, extremity weakness, headaches and numbness.  Hematological:  Negative for adenopathy. Does not bruise/bleed easily.  Psychiatric/Behavioral:  Negative for depression. The patient is not nervous/anxious.       PHYSICAL EXAMINATION  ECOG PERFORMANCE STATUS: 1 - Symptomatic but completely ambulatory  Vitals:   07/10/22 0828  BP: 134/63  Pulse: 67  Resp: 16  Temp: 97.7 F (36.5 C)  SpO2: 100%      Physical Exam Constitutional:      General: She is not in acute distress.    Appearance: Normal appearance. She is not toxic-appearing.  HENT:     Head: Normocephalic and atraumatic.  Eyes:     General: No scleral icterus. Cardiovascular:     Rate and Rhythm: Normal rate and regular rhythm.     Pulses: Normal pulses.     Heart sounds: Normal heart sounds.  Pulmonary:     Effort: Pulmonary effort is normal.     Breath sounds: Normal breath sounds.  Chest:       Comments: Some mild erythema left inner breast. Post rad changes.  No palpable masses both breasts. No regional adenopathy. Abdominal:     General: Abdomen is flat. Bowel sounds are normal. There is no distension.     Palpations: Abdomen is soft.     Tenderness: There is no abdominal tenderness.  Musculoskeletal:        General: No swelling.     Cervical back: Neck supple.  Lymphadenopathy:     Cervical: No cervical adenopathy.  Skin:    General: Skin is warm and dry.     Findings: No rash.  Neurological:     General: No focal deficit present.     Mental Status: She is alert.  Psychiatric:        Mood and Affect: Mood normal.        Behavior: Behavior normal.     LABORATORY DATA:  CBC    Component Value Date/Time   WBC 6.6 07/10/2022 0809   WBC 6.8 06/16/2019 1456   RBC 3.70 (L) 07/10/2022 0809   HGB 11.4 (L) 07/10/2022 0809   HCT 33.5 (L) 07/10/2022 0809   PLT 220 07/10/2022 0809   MCV 90.5 07/10/2022 0809   MCH 30.8 07/10/2022 0809   MCHC 34.0 07/10/2022 0809   RDW 12.3 07/10/2022 0809   LYMPHSABS 0.7 07/10/2022 0809   MONOABS 0.4 07/10/2022 0809   EOSABS 0.1 07/10/2022 0809   BASOSABS 0.0 07/10/2022 0809    CMP     Component Value Date/Time   NA 138 07/10/2022 0809   NA 142 10/31/2019 1545   K 4.0 07/10/2022 0809   CL 106 07/10/2022 0809   CO2 28 07/10/2022 0809   GLUCOSE 100 (H) 07/10/2022 0809   BUN 22 07/10/2022 0809   BUN 11 10/31/2019 1545   CREATININE 0.79 07/10/2022 0809    CALCIUM 9.1 07/10/2022 0809   PROT 6.5 07/10/2022 0809   PROT 6.7 08/01/2020 1004   ALBUMIN 4.0 07/10/2022 0809   ALBUMIN 4.5 08/01/2020 1004   AST 25 07/10/2022 0809   ALT 28 07/10/2022 0809   ALKPHOS 35 (L) 07/10/2022 0809   BILITOT 0.9 07/10/2022 0809   GFRNONAA >60  07/10/2022 0809   GFRAA 104 10/31/2019 1545      ASSESSMENT and THERAPY PLAN:   Malignant neoplasm of upper-outer quadrant of left breast in female, estrogen receptor positive (HCC) Christine [name] is a 62 year old woman with stage Ia ER positive, PR negative, Her 2 amplified breast cancer status postlumpectomy and currently undergoing adjuvant chemotherapy with Taxol and Herceptin.  She is now status post adjuvant Herceptin and Taxol.  She is on Herceptin and Tamoxifen. She is tolerating treatment very well.  She denies any adverse effects overall.  Neuropathy is improving,  No concerns on exam except for mild pinkish skin in the left breast inner quadrant, no palpable masses, post rad changes noted. I explained to her to keep an eye on this and report to [place] with any further changes. She expressed understanding. Herceptin every 3 weeks, FU every 6 weeks. Mammogram scheduled,  ECHO due July first week ordered. No concerns for cardiac toxicity.  Rachel Moulds MD   All questions were answered. The patient knows to call the clinic with any problems, questions or concerns. We can certainly see the patient much sooner if necessary. This note was electronically signed.  Total encounter time:30 minutes*in face-to-face visit time, chart review, lab review, care coordination, order entry, and documentation of the encounter time.  *Total Encounter Time as defined by the Centers for Medicare and Medicaid Services includes, in addition to the face-to-face time of a patient visit (documented in the note above) non-face-to-face time: obtaining and reviewing outside history, ordering and reviewing medications, tests or procedures, care  coordination (communications with other health care professionals or caregivers) and documentation in the medical record.

## 2022-07-10 NOTE — Assessment & Plan Note (Signed)
Christine [name] is a 62 year old woman with stage Ia ER positive, PR negative, Her 2 amplified breast cancer status postlumpectomy and currently undergoing adjuvant chemotherapy with Taxol and Herceptin.  She is now status post adjuvant Herceptin and Taxol.  She is on Herceptin and Tamoxifen. She is tolerating treatment very well.  She denies any adverse effects overall.  Neuropathy is improving,  No concerns on exam except for mild pinkish skin in the left breast inner quadrant, no palpable masses, post rad changes noted. I explained to her to keep an eye on this and report to [place] with any further changes. She expressed understanding. Herceptin every 3 weeks, FU every 6 weeks. Mammogram scheduled,  ECHO due July first week ordered. No concerns for cardiac toxicity.  Rachel Moulds MD

## 2022-07-10 NOTE — Patient Instructions (Signed)
[facility]  Discharge Instructions: Thank you for choosing [facility] to provide your oncology and hematology care.   If you have a lab appointment with the Cancer Center, please go directly to the Cancer Center and check in at the registration area.   Wear comfortable clothing and clothing appropriate for easy access to any Portacath or PICC line.   We strive to give you quality time with your provider. You may need to reschedule your appointment if you arrive late (15 or more minutes).  Arriving late affects you and other patients whose appointments are after yours.  Also, if you miss three or more appointments without notifying the office, you may be dismissed from the clinic at the provider's discretion.      For prescription refill requests, have your pharmacy contact our office and allow 72 hours for refills to be completed.    Today you received the following chemotherapy and/or immunotherapy agents: Trastuzumab      To help prevent nausea and vomiting after your treatment, we encourage you to take your nausea medication as directed.  BELOW ARE SYMPTOMS THAT SHOULD BE REPORTED IMMEDIATELY: *FEVER GREATER THAN 100.4 F (38 C) OR HIGHER *CHILLS OR SWEATING *NAUSEA AND VOMITING THAT IS NOT CONTROLLED WITH YOUR NAUSEA MEDICATION *UNUSUAL SHORTNESS OF BREATH *UNUSUAL BRUISING OR BLEEDING *URINARY PROBLEMS (pain or burning when urinating, or frequent urination) *BOWEL PROBLEMS (unusual diarrhea, constipation, pain near the anus) TENDERNESS IN MOUTH AND THROAT WITH OR WITHOUT PRESENCE OF ULCERS (sore throat, sores in mouth, or a toothache) UNUSUAL RASH, SWELLING OR PAIN  UNUSUAL VAGINAL DISCHARGE OR ITCHING   Items with * indicate a potential emergency and should be followed up as soon as possible or go to the Emergency Department if any problems should occur.  Please show the CHEMOTHERAPY ALERT CARD or IMMUNOTHERAPY ALERT CARD at  check-in to the Emergency Department and triage nurse.  Should you have questions after your visit or need to cancel or reschedule your appointment, please contact [facility]  Dept: [phone]  and follow the prompts.  Office hours are 8:00 a.m. to 4:30 p.m. Monday - Friday. Please note that voicemails left after 4:00 p.m. may not be returned until the following business day.  We are closed weekends and major holidays. You have access to a nurse at all times for urgent questions. Please call the main number to the clinic Dept: [phone] and follow the prompts.   For any non-urgent questions, you may also contact your provider using MyChart. We now offer e-Visits for anyone 18 and older to request care online for non-urgent symptoms. For details visit [email or web address].   Also download the MyChart app! Go to the app store, search "MyChart", open the app, select [facility], and log in with your MyChart username and password.  

## 2022-07-11 ENCOUNTER — Ambulatory Visit
Admission: RE | Admit: 2022-07-11 | Discharge: 2022-07-11 | Disposition: A | Discharge status: Home / Self Care | Payer: No Typology Code available for payment source | Source: Ambulatory Visit | Attending: Hematology and Oncology | Admitting: Hematology and Oncology

## 2022-07-11 ENCOUNTER — Other Ambulatory Visit: Payer: Self-pay | Admitting: Hematology and Oncology

## 2022-07-11 DIAGNOSIS — Z170 Estrogen receptor positive status [ER+]: Secondary | ICD-10-CM

## 2022-07-11 DIAGNOSIS — N6321 Unspecified lump in the left breast, upper outer quadrant: Secondary | ICD-10-CM

## 2022-07-19 ENCOUNTER — Other Ambulatory Visit: Payer: Self-pay | Admitting: Cardiology

## 2022-07-19 DIAGNOSIS — I251 Atherosclerotic heart disease 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: Secondary | ICD-10-CM

## 2022-07-22 NOTE — Telephone Encounter (Signed)
Rx request sent to pharmacy.  

## 2022-07-24 ENCOUNTER — Encounter: Payer: Self-pay | Admitting: Hematology and Oncology

## 2022-07-31 ENCOUNTER — Inpatient Hospital Stay: Payer: No Typology Code available for payment source

## 2022-07-31 ENCOUNTER — Other Ambulatory Visit: Payer: Self-pay

## 2022-07-31 VITALS — BP 117/64 | HR 71 | Temp 98.6°F | Resp 16 | Wt 137.2 lb

## 2022-07-31 DIAGNOSIS — Z170 Estrogen receptor positive status [ER+]: Secondary | ICD-10-CM

## 2022-07-31 DIAGNOSIS — C50412 Malignant neoplasm of upper-outer quadrant of left female breast: Secondary | ICD-10-CM | POA: Diagnosis not present

## 2022-07-31 MED ORDER — DIPHENHYDRAMINE HCL 25 MG PO CAPS
25.0000 mg | ORAL_CAPSULE | Freq: Once | ORAL | Status: AC
  Administered 2022-07-31: 25 mg via ORAL
  Filled 2022-07-31: qty 1

## 2022-07-31 MED ORDER — SODIUM CHLORIDE 0.9 % IV SOLN: Freq: Once | INTRAVENOUS | Status: AC

## 2022-07-31 MED ORDER — ACETAMINOPHEN 325 MG PO TABS
650.0000 mg | ORAL_TABLET | Freq: Once | ORAL | Status: AC
  Administered 2022-07-31: 650 mg via ORAL
  Filled 2022-07-31: qty 2

## 2022-07-31 MED ORDER — HEPARIN SOD (PORK) LOCK FLUSH 100 UNIT/ML IV SOLN
500.0000 [IU] | Freq: Once | INTRAVENOUS | Status: AC | PRN
  Administered 2022-07-31: 500 [IU]

## 2022-07-31 MED ORDER — TRASTUZUMAB-DKST CHEMO 150 MG IV SOLR
378.0000 mg | Freq: Once | INTRAVENOUS | Status: AC
  Administered 2022-07-31: 378 mg via INTRAVENOUS
  Filled 2022-07-31: qty 18

## 2022-07-31 MED ORDER — SODIUM CHLORIDE 0.9% FLUSH
10.0000 mL | INTRAVENOUS | Status: DC | PRN
  Administered 2022-07-31: 10 mL

## 2022-07-31 NOTE — Patient Instructions (Signed)
[facility]  Discharge Instructions: Thank you for choosing [facility] to provide your oncology and hematology care.   If you have a lab appointment with the Cancer Center, please go directly to the Cancer Center and check in at the registration area.   Wear comfortable clothing and clothing appropriate for easy access to any Portacath or PICC line.   We strive to give you quality time with your provider. You may need to reschedule your appointment if you arrive late (15 or more minutes).  Arriving late affects you and other patients whose appointments are after yours.  Also, if you miss three or more appointments without notifying the office, you may be dismissed from the clinic at the provider's discretion.      For prescription refill requests, have your pharmacy contact our office and allow 72 hours for refills to be completed.    Today you received the following chemotherapy and/or immunotherapy agents: Trastuzumab      To help prevent nausea and vomiting after your treatment, we encourage you to take your nausea medication as directed.  BELOW ARE SYMPTOMS THAT SHOULD BE REPORTED IMMEDIATELY: *FEVER GREATER THAN 100.4 F (38 C) OR HIGHER *CHILLS OR SWEATING *NAUSEA AND VOMITING THAT IS NOT CONTROLLED WITH YOUR NAUSEA MEDICATION *UNUSUAL SHORTNESS OF BREATH *UNUSUAL BRUISING OR BLEEDING *URINARY PROBLEMS (pain or burning when urinating, or frequent urination) *BOWEL PROBLEMS (unusual diarrhea, constipation, pain near the anus) TENDERNESS IN MOUTH AND THROAT WITH OR WITHOUT PRESENCE OF ULCERS (sore throat, sores in mouth, or a toothache) UNUSUAL RASH, SWELLING OR PAIN  UNUSUAL VAGINAL DISCHARGE OR ITCHING   Items with * indicate a potential emergency and should be followed up as soon as possible or go to the Emergency Department if any problems should occur.  Please show the CHEMOTHERAPY ALERT CARD or IMMUNOTHERAPY ALERT CARD at  check-in to the Emergency Department and triage nurse.  Should you have questions after your visit or need to cancel or reschedule your appointment, please contact [facility]  Dept: [phone]  and follow the prompts.  Office hours are 8:00 a.m. to 4:30 p.m. Monday - Friday. Please note that voicemails left after 4:00 p.m. may not be returned until the following business day.  We are closed weekends and major holidays. You have access to a nurse at all times for urgent questions. Please call the main number to the clinic Dept: [phone] and follow the prompts.   For any non-urgent questions, you may also contact your provider using MyChart. We now offer e-Visits for anyone 18 and older to request care online for non-urgent symptoms. For details visit [email or web address].   Also download the MyChart app! Go to the app store, search "MyChart", open the app, select [facility], and log in with your MyChart username and password.  

## 2022-08-11 ENCOUNTER — Other Ambulatory Visit (HOSPITAL_COMMUNITY): Payer: No Typology Code available for payment source

## 2022-08-18 ENCOUNTER — Ambulatory Visit (HOSPITAL_COMMUNITY)
Admission: RE | Admit: 2022-08-18 | Discharge: 2022-08-18 | Disposition: A | Discharge status: Home / Self Care | Payer: No Typology Code available for payment source | Source: Ambulatory Visit | Attending: Hematology and Oncology | Admitting: Hematology and Oncology

## 2022-08-18 ENCOUNTER — Encounter: Payer: Self-pay | Admitting: Hematology and Oncology

## 2022-08-18 DIAGNOSIS — I10 Essential (primary) hypertension: Secondary | ICD-10-CM | POA: Diagnosis not present

## 2022-08-18 DIAGNOSIS — Z170 Estrogen receptor positive status [ER+]: Secondary | ICD-10-CM | POA: Diagnosis not present

## 2022-08-18 DIAGNOSIS — C50412 Malignant neoplasm of upper-outer quadrant of left female breast: Secondary | ICD-10-CM | POA: Insufficient documentation

## 2022-08-18 DIAGNOSIS — Z01818 Encounter for other preprocedural examination: Secondary | ICD-10-CM | POA: Diagnosis not present

## 2022-08-18 DIAGNOSIS — Z0189 Encounter for other specified special examinations: Secondary | ICD-10-CM | POA: Diagnosis not present

## 2022-08-18 LAB — ECHOCARDIOGRAM COMPLETE
AR max vel: 2.34 cm2
AV Area VTI: 2.44 cm2
AV Area mean vel: 2.4 cm2
AV Mean grad: 3 mmHg
AV Peak grad: 6.3 mmHg
Ao pk vel: 1.25 m/s
Area-P 1/2: 2.99 cm2
S' Lateral: 2.4 cm

## 2022-08-21 ENCOUNTER — Inpatient Hospital Stay (HOSPITAL_BASED_OUTPATIENT_CLINIC_OR_DEPARTMENT_OTHER): Payer: No Typology Code available for payment source | Admitting: Hematology and Oncology

## 2022-08-21 ENCOUNTER — Inpatient Hospital Stay: Payer: No Typology Code available for payment source

## 2022-08-21 ENCOUNTER — Inpatient Hospital Stay: Payer: No Typology Code available for payment source | Attending: Hematology and Oncology

## 2022-08-21 ENCOUNTER — Other Ambulatory Visit: Payer: Self-pay

## 2022-08-21 ENCOUNTER — Encounter: Payer: Self-pay | Admitting: *Deleted

## 2022-08-21 VITALS — BP 124/68 | HR 60 | Temp 97.3°F | Resp 16 | Wt 136.6 lb

## 2022-08-21 DIAGNOSIS — E559 Vitamin D deficiency, unspecified: Secondary | ICD-10-CM | POA: Insufficient documentation

## 2022-08-21 DIAGNOSIS — I251 Atherosclerotic heart disease 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: Secondary | ICD-10-CM | POA: Insufficient documentation

## 2022-08-21 DIAGNOSIS — Z5112 Encounter for antineoplastic immunotherapy: Secondary | ICD-10-CM | POA: Insufficient documentation

## 2022-08-21 DIAGNOSIS — D509 Iron deficiency anemia, unspecified: Secondary | ICD-10-CM | POA: Insufficient documentation

## 2022-08-21 DIAGNOSIS — Z801 Family history of malignant neoplasm of trachea, bronchus and lung: Secondary | ICD-10-CM | POA: Insufficient documentation

## 2022-08-21 DIAGNOSIS — G629 Polyneuropathy, unspecified: Secondary | ICD-10-CM | POA: Insufficient documentation

## 2022-08-21 DIAGNOSIS — Z170 Estrogen receptor positive status [ER+]: Secondary | ICD-10-CM

## 2022-08-21 DIAGNOSIS — D6481 Anemia due to antineoplastic chemotherapy: Secondary | ICD-10-CM | POA: Insufficient documentation

## 2022-08-21 DIAGNOSIS — Z803 Family history of malignant neoplasm of breast: Secondary | ICD-10-CM | POA: Diagnosis not present

## 2022-08-21 DIAGNOSIS — C50412 Malignant neoplasm of upper-outer quadrant of left female breast: Secondary | ICD-10-CM

## 2022-08-21 DIAGNOSIS — I10 Essential (primary) hypertension: Secondary | ICD-10-CM | POA: Diagnosis not present

## 2022-08-21 DIAGNOSIS — E782 Mixed hyperlipidemia: Secondary | ICD-10-CM | POA: Insufficient documentation

## 2022-08-21 DIAGNOSIS — K219 Gastro-esophageal reflux disease without esophagitis: Secondary | ICD-10-CM | POA: Diagnosis not present

## 2022-08-21 DIAGNOSIS — Z95828 Presence of other vascular implants and grafts: Secondary | ICD-10-CM

## 2022-08-21 LAB — CBC WITH DIFFERENTIAL (CANCER CENTER ONLY)
Abs Immature Granulocytes: 0 10*3/uL (ref 0.00–0.07)
Basophils Absolute: 0.1 10*3/uL (ref 0.0–0.1)
Basophils Relative: 1 %
Eosinophils Absolute: 0.3 10*3/uL (ref 0.0–0.5)
Eosinophils Relative: 6 %
HCT: 34.5 % — ABNORMAL LOW (ref 36.0–46.0)
Hemoglobin: 11.6 g/dL — ABNORMAL LOW (ref 12.0–15.0)
Immature Granulocytes: 0 %
Lymphocytes Relative: 24 %
Lymphs Abs: 1.4 10*3/uL (ref 0.7–4.0)
MCH: 30.6 pg (ref 26.0–34.0)
MCHC: 33.6 g/dL (ref 30.0–36.0)
MCV: 91 fL (ref 80.0–100.0)
Monocytes Absolute: 0.5 10*3/uL (ref 0.1–1.0)
Monocytes Relative: 8 %
Neutro Abs: 3.5 10*3/uL (ref 1.7–7.7)
Neutrophils Relative %: 61 %
Platelet Count: 227 10*3/uL (ref 150–400)
RBC: 3.79 MIL/uL — ABNORMAL LOW (ref 3.87–5.11)
RDW: 12.4 % (ref 11.5–15.5)
WBC Count: 5.7 10*3/uL (ref 4.0–10.5)
nRBC: 0 % (ref 0.0–0.2)

## 2022-08-21 LAB — CMP (CANCER CENTER ONLY)
ALT: 21 U/L (ref 0–44)
AST: 21 U/L (ref 15–41)
Albumin: 3.9 g/dL (ref 3.5–5.0)
Alkaline Phosphatase: 33 U/L — ABNORMAL LOW (ref 38–126)
Anion gap: 4 — ABNORMAL LOW (ref 5–15)
BUN: 16 mg/dL (ref 8–23)
CO2: 29 mmol/L (ref 22–32)
Calcium: 8.6 mg/dL — ABNORMAL LOW (ref 8.9–10.3)
Chloride: 108 mmol/L (ref 98–111)
Creatinine: 0.74 mg/dL (ref 0.44–1.00)
GFR, Estimated: >60 mL/min (ref >60)
Glucose, Bld: 80 mg/dL (ref 70–99)
Potassium: 4 mmol/L (ref 3.5–5.1)
Sodium: 141 mmol/L (ref 135–145)
Total Bilirubin: 0.7 mg/dL (ref 0.3–1.2)
Total Protein: 6.3 g/dL — ABNORMAL LOW (ref 6.5–8.1)

## 2022-08-21 MED ORDER — TRASTUZUMAB-DKST CHEMO 150 MG IV SOLR
378.0000 mg | Freq: Once | INTRAVENOUS | Status: AC
  Administered 2022-08-21: 378 mg via INTRAVENOUS
  Filled 2022-08-21: qty 18

## 2022-08-21 MED ORDER — SODIUM CHLORIDE 0.9% FLUSH
10.0000 mL | Freq: Once | INTRAVENOUS | Status: AC
  Administered 2022-08-21: 10 mL

## 2022-08-21 MED ORDER — ACETAMINOPHEN 325 MG PO TABS
650.0000 mg | ORAL_TABLET | Freq: Once | ORAL | Status: AC
  Administered 2022-08-21: 650 mg via ORAL
  Filled 2022-08-21: qty 2

## 2022-08-21 MED ORDER — DIPHENHYDRAMINE HCL 25 MG PO CAPS
25.0000 mg | ORAL_CAPSULE | Freq: Once | ORAL | Status: AC
  Administered 2022-08-21: 25 mg via ORAL
  Filled 2022-08-21: qty 1

## 2022-08-21 MED ORDER — SODIUM CHLORIDE 0.9 % IV SOLN: Freq: Once | INTRAVENOUS | Status: AC

## 2022-08-21 NOTE — Patient Instructions (Signed)
[facility]  Discharge Instructions: Thank you for choosing [facility] to provide your oncology and hematology care.   If you have a lab appointment with the Cancer Center, please go directly to the Cancer Center and check in at the registration area.   Wear comfortable clothing and clothing appropriate for easy access to any Portacath or PICC line.   We strive to give you quality time with your provider. You may need to reschedule your appointment if you arrive late (15 or more minutes).  Arriving late affects you and other patients whose appointments are after yours.  Also, if you miss three or more appointments without notifying the office, you may be dismissed from the clinic at the provider's discretion.      For prescription refill requests, have your pharmacy contact our office and allow 72 hours for refills to be completed.    Today you received the following chemotherapy and/or immunotherapy agents herceptin      To help prevent nausea and vomiting after your treatment, we encourage you to take your nausea medication as directed.  BELOW ARE SYMPTOMS THAT SHOULD BE REPORTED IMMEDIATELY: *FEVER GREATER THAN 100.4 F (38 C) OR HIGHER *CHILLS OR SWEATING *NAUSEA AND VOMITING THAT IS NOT CONTROLLED WITH YOUR NAUSEA MEDICATION *UNUSUAL SHORTNESS OF BREATH *UNUSUAL BRUISING OR BLEEDING *URINARY PROBLEMS (pain or burning when urinating, or frequent urination) *BOWEL PROBLEMS (unusual diarrhea, constipation, pain near the anus) TENDERNESS IN MOUTH AND THROAT WITH OR WITHOUT PRESENCE OF ULCERS (sore throat, sores in mouth, or a toothache) UNUSUAL RASH, SWELLING OR PAIN  UNUSUAL VAGINAL DISCHARGE OR ITCHING   Items with * indicate a potential emergency and should be followed up as soon as possible or go to the Emergency Department if any problems should occur.  Please show the CHEMOTHERAPY ALERT CARD or IMMUNOTHERAPY ALERT CARD at  check-in to the Emergency Department and triage nurse.  Should you have questions after your visit or need to cancel or reschedule your appointment, please contact [facility]  Dept: [phone]  and follow the prompts.  Office hours are 8:00 a.m. to 4:30 p.m. Monday - Friday. Please note that voicemails left after 4:00 p.m. may not be returned until the following business day.  We are closed weekends and major holidays. You have access to a nurse at all times for urgent questions. Please call the main number to the clinic Dept: [phone] and follow the prompts.   For any non-urgent questions, you may also contact your provider using MyChart. We now offer e-Visits for anyone 18 and older to request care online for non-urgent symptoms. For details visit [email or web address].   Also download the MyChart app! Go to the app store, search "MyChart", open the app, select [facility], and log in with your MyChart username and password.   

## 2022-08-21 NOTE — Assessment & Plan Note (Addendum)
Vallie is a 62 year old woman with stage Ia ER positive, PR negative, Her 2 amplified breast cancer status postlumpectomy and currently undergoing adjuvant chemotherapy with Taxol and Herceptin.  She is now status post adjuvant Taxol.  She is on Herceptin and Tamoxifen. Today is last dose of herceptin. She had an ECHO which showed increase in EF with some hyperdynamic function and impaired relaxation.  She is tolerating treatment very well.  She denies any adverse effects overall.  Neuropathy is improving,  No concerns on exam, the warmth and erythema described in the left breast is not remarkable. She however has a FU with Dr Carolynne Edouard already. With regards to hyperdynamic function, discussed with Dr Duke Salvia, ok to proceed with herceptin today Herceptin every 3 weeks,  Continue Tamoxifen, RTC in 6 months or sooner as needed.  Rachel Moulds MD

## 2022-08-21 NOTE — Progress Notes (Signed)
[facility] Cancer Follow up:    Thana Ates, MD [address] [place] [address]   DIAGNOSIS:  Cancer Staging  Malignant neoplasm of upper-outer quadrant of left breast in female, estrogen receptor positive (HCC) Staging form: Breast, AJCC 8th Edition - Clinical stage from 07/29/2021: Stage IA (cT1c, cN0, cM0, G3, ER+, PR-, HER2+) - Signed by Ronny Bacon, PA-C on 07/31/2021 Stage prefix: Initial diagnosis Method of lymph node assessment: Clinical Histologic grading system: 3 grade system   SUMMARY OF ONCOLOGIC HISTORY: Oncology History  Malignant neoplasm of upper-outer quadrant of left breast in female, estrogen receptor positive (HCC)  07/17/2021 Mammogram   Suspicious left breast mass at 3 o'clock, 4 cm from the nipple. No axillary adenopathy. Targeted ultrasound is performed, showing an irregular centrally hypoechoic mass with an echogenic rim measuring 7 x 5 by 7 mm, thought to correlate with the mammographically identified mass. This mass is located at 3 o'clock, 4 cm from the nipple. No axillary adenopathy.   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female, estrogen receptor positive (HCC)   07/29/2021 Cancer Staging   Staging form: Breast, AJCC 8th Edition - Clinical stage from 07/29/2021: Stage IA (cT1c, cN0, cM0, G3, ER+, PR-, HER2+) - Signed by Ronny Bacon, PA-C on 07/31/2021 Stage prefix: Initial diagnosis Method of lymph node assessment: Clinical Histologic grading system: 3 grade system    Pathology Results   Path showed IDC, grade 3, ER 100% positive, strong staining intensity, PR negative, ki 67 35%, Her 2 positive   08/05/2021 Genetic Testing   Negative hereditary cancer genetic testing: no pathogenic variants detected in [facility] BRCAPlus Panel and [facility] CustomNext-Cancer +RNAinsight Panel.  Report dates are August 05, 2021 and August 08, 2021.    The BRCAplus panel  offered by [employer] and includes sequencing and deletion/duplication analysis for the following 8 genes: ATM, BRCA1, BRCA2, CDH1, CHEK2, PALB2, PTEN, and TP53. The CustomNext-Cancer+RNAinsight panel offered by [employer] includes sequencing and rearrangement analysis for the following 47 genes:  APC, ATM, AXIN2, BARD1, BMPR1A, BRCA1, BRCA2, BRIP1, CDH1, CDK4, CDKN2A, CHEK2, DICER1, EPCAM, GREM1, HOXB13, MEN1, MLH1, MSH2, MSH3, MSH6, MUTYH, NBN, NF1, NF2, NTHL1, PALB2, PMS2, POLD1, POLE, PTEN, RAD51C, RAD51D, RECQL, RET, SDHA, SDHAF2, SDHB, SDHC, SDHD, SMAD4, SMARCA4, STK11, TP53, TSC1, TSC2, and VHL.  RNA data is routinely analyzed for use in variant interpretation for all genes.   08/09/2021 Surgery   Left lumpectomy: IDC, grade 3, 3 sentinel lymph nodes all negative for cancer.  He has biomarker testing ER positive, PR negative, HER2 positive.  pT1c, PN0.   09/12/2021 -  Adjuvant Chemotherapy   Taxol/Herceptin weekly x12, followed by Herceptin given every 3 weeks to complete 1 year of treatment.     CURRENT THERAPY: Herceptin; Tamoxifen daily  INTERVAL HISTORY:  JAYLIAH BENETT 62 y.o. female returns for evaluation prior to receiving Herceptin therapy.  Her most recent echocardiogram in April with EF of 70-75%, hyperdynamic function. Grade 1 diastolic dysfunction with impaired relaxation.  She is now on adjuvant Tamoxifen. She is tolerating this very well.  She is also on herceptin adjuvant, tolerating it well. Today is her last dose of herceptin. Bone density showed osteoporosis, she has a FU with endocrinology end of July She says neuropathy is less frequent but still intense. She otherwise denies any new complaints. 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 07/31/2021   Easy bruising 07/31/2021   Essential hypertension 07/31/2021   Hearing loss in left ear 07/31/2021   Insomnia 07/31/2021   Mixed hyperlipidemia 07/31/2021   Other long term (current) drug therapy 07/31/2021   Sleep disorder 07/31/2021   Tendency toward bleeding easily (HCC) 07/31/2021   Vitamin D deficiency 07/31/2021   Malignant neoplasm of upper-outer quadrant of left breast in female, estrogen receptor positive (HCC) 07/25/2021   Abnormal finding on mammography 07/10/2021   GERD (gastroesophageal reflux disease) 08/31/2019   Globus pharyngeus 08/02/2019   Rhinitis, chronic 08/02/2019   Elevated blood pressure reading 01/26/2019   Acute pain of right knee 10/26/2017    is allergic to lisinopril and mefloquine.  MEDICAL HISTORY: Past Medical History:  Diagnosis Date   Family history of breast cancer 07/31/2021   GERD (gastroesophageal reflux disease)    Hypertension    Personal history of radiation therapy 01/08/2022   ended 02-06-22    SURGICAL HISTORY: Past Surgical History:  Procedure Laterality Date   BREAST LUMPECTOMY Left 08/09/2021   BREAST LUMPECTOMY WITH RADIOACTIVE SEED AND SENTINEL LYMPH NODE BIOPSY Left 08/09/2021   Procedure: LEFT BREAST RADIOACTIVE SEED LOCALIZED LUMPECTOMY AND SENTINEL NODE BIOPSY;  Surgeon: Griselda Miner, MD;  Location: [facility] OR;  Service: General;  Laterality: Left;  GEN & PEC BLOCK   CESAREAN SECTION     FOOT SURGERY Right    PORTACATH PLACEMENT Right 08/09/2021   Procedure: PORT PLACEMENT RIGHT  INTERNAL JUGULAR  WITH ULTRASOUND GUIDANCE;  Surgeon: Griselda Miner, MD;  Location: [facility] OR;  Service: General;  Laterality: Right;  RIGHT INTERNAL JUGULAR PLACEMENT    SOCIAL HISTORY: Social History   Socioeconomic History   Marital status: Married    Spouse name:  Not on file   Number of children: Not on file   Years of education: Not on file   Highest education level: Not on file  Occupational History   Not on file  Tobacco Use   Smoking status: Never   Smokeless tobacco: Never  Vaping Use   Vaping status: Never Used  Substance and Sexual Activity   Alcohol use: Yes    Alcohol/week: 2.0 standard drinks of alcohol    Types: 2 Glasses of wine per week   Drug use: Never   Sexual activity: Not on file  Other Topics Concern   Not on file  Social History Narrative   Not on file   Social Determinants of Health   Financial Resource Strain: Not on file  Food Insecurity: Not on file  Transportation Needs: Not on file  Physical Activity: Not on file  Stress: Not on file  Social Connections: Not on file  Intimate Partner Violence: Not on file    FAMILY HISTORY: Family History  Problem Relation Age of Onset   Heart disease Father    Breast cancer Sister 64   Esophageal cancer Maternal Uncle        dx after age 1   Lung cancer Paternal Uncle    Skin cancer Paternal Uncle     Review of Systems  Constitutional:  Negative for appetite change, chills, fatigue, fever and unexpected weight change.  HENT:   Negative for hearing loss, lump/mass and trouble swallowing.   Eyes:  Negative for eye problems and icterus.  Respiratory:  Negative for chest tightness, cough and shortness of breath.   Cardiovascular:  Negative for chest pain, leg swelling and palpitations.  Gastrointestinal:  Negative for abdominal distention, abdominal pain, constipation, diarrhea, nausea and vomiting.  Endocrine: Negative for hot flashes.  Genitourinary:  Negative for difficulty urinating.   Musculoskeletal:  Negative for arthralgias.  Skin:  Negative for itching and rash.  Neurological:  Negative for dizziness, extremity weakness, headaches and numbness.  Hematological:  Negative for adenopathy. Does not bruise/bleed easily.  Psychiatric/Behavioral:  Negative for  depression. The patient is not nervous/anxious.       PHYSICAL EXAMINATION  ECOG PERFORMANCE STATUS: 1 - Symptomatic but completely ambulatory  Vitals:   08/21/22 0904  BP: 124/68  Pulse: 60  Resp: 16  Temp: (!) 97.3 F (36.3 C)  SpO2: 99%     Physical Exam Constitutional:      General: She is not in acute distress.    Appearance: Normal appearance. She is not toxic-appearing.  HENT:     Head: Normocephalic and atraumatic.  Eyes:     General: No scleral icterus. Cardiovascular:     Rate and Rhythm: Normal rate and regular rhythm.     Pulses: Normal pulses.     Heart sounds: Normal heart sounds.  Pulmonary:     Effort: Pulmonary effort is normal.     Breath sounds: Normal breath sounds.  Chest:       Comments: Bilateral breasts examined. The area of concern is less erythematous to me, non tender, I couldn't appreciate much warmth on my exam. No palpable masses. No regional adenopathy. Abdominal:     General: Abdomen is flat. Bowel sounds are normal. There is no distension.     Palpations: Abdomen is soft.     Tenderness: There is no abdominal tenderness.  Musculoskeletal:        General: No swelling.     Cervical back: Neck supple.  Lymphadenopathy:     Cervical: No cervical adenopathy.  Skin:    General: Skin is warm and dry.     Findings: No rash.  Neurological:     General: No focal deficit present.     Mental Status: She is alert.  Psychiatric:        Mood and Affect: Mood normal.        Behavior: Behavior normal.     LABORATORY DATA:  CBC    Component Value Date/Time   WBC 5.7 08/21/2022 0833   WBC 6.8 06/16/2019 1456   RBC 3.79 (L) 08/21/2022 0833   HGB 11.6 (L) 08/21/2022 0833   HCT 34.5 (L) 08/21/2022 0833   PLT 227 08/21/2022 0833   MCV 91.0 08/21/2022 0833   MCH 30.6 08/21/2022 0833   MCHC 33.6 08/21/2022 0833   RDW 12.4 08/21/2022 0833   LYMPHSABS 1.4 08/21/2022 0833   MONOABS 0.5 08/21/2022 0833   EOSABS 0.3 08/21/2022 0833    BASOSABS 0.1 08/21/2022 0833    CMP     Component Value Date/Time   NA 141 08/21/2022 0833   NA 142 10/31/2019 1545   K 4.0 08/21/2022 0833   CL 108 08/21/2022 0833   CO2 29 08/21/2022 0833   GLUCOSE 80 08/21/2022 0833   BUN 16 08/21/2022 0833   BUN 11 10/31/2019 1545   CREATININE 0.74 08/21/2022 0833   CALCIUM 8.6 (L) 08/21/2022 0102  PROT 6.3 (L) 08/21/2022 0833   PROT 6.7 08/01/2020 1004   ALBUMIN 3.9 08/21/2022 0833   ALBUMIN 4.5 08/01/2020 1004   AST 21 08/21/2022 0833   ALT 21 08/21/2022 0833   ALKPHOS 33 (L) 08/21/2022 0833   BILITOT 0.7 08/21/2022 0833   GFRNONAA >60 08/21/2022 0833   GFRAA 104 10/31/2019 1545      ASSESSMENT and THERAPY PLAN:   Malignant neoplasm of upper-outer quadrant of left breast in female, estrogen receptor positive (HCC) Evanne is a 62 year old woman with stage Ia ER positive, PR negative, Her 2 amplified breast cancer status postlumpectomy and currently undergoing adjuvant chemotherapy with Taxol and Herceptin.  She is now status post adjuvant Taxol.  She is on Herceptin and Tamoxifen. Today is last dose of herceptin. She had an ECHO which showed increase in EF with some hyperdynamic function and impaired relaxation.  She is tolerating treatment very well.  She denies any adverse effects overall.  Neuropathy is improving,  No concerns on exam, the warmth and erythema described in the left breast is not remarkable. She however has a FU with Dr Carolynne Edouard already. With regards to hyperdynamic function, discussed with Dr Duke Salvia, ok to proceed with herceptin today Herceptin every 3 weeks,  Continue Tamoxifen, RTC in 6 months or sooner as needed.  Rachel Moulds MD   All questions were answered. The patient knows to call the clinic with any problems, questions or concerns. We can certainly see the patient much sooner if necessary. This note was electronically signed.  Total encounter time:30 minutes*in face-to-face visit time, chart review,  lab review, care coordination, order entry, and documentation of the encounter time.  *Total Encounter Time as defined by the Centers for Medicare and Medicaid Services includes, in addition to the face-to-face time of a patient visit (documented in the note above) non-face-to-face time: obtaining and reviewing outside history, ordering and reviewing medications, tests or procedures, care coordination (communications with other health care professionals or caregivers) and documentation in the medical record.

## 2022-08-22 ENCOUNTER — Telehealth: Payer: Self-pay | Admitting: Hematology and Oncology

## 2022-08-22 NOTE — Telephone Encounter (Signed)
Spoke with patient confirming upcoming appointment  

## 2022-08-25 ENCOUNTER — Other Ambulatory Visit: Payer: Self-pay

## 2022-08-26 ENCOUNTER — Encounter: Payer: Self-pay | Admitting: Hematology and Oncology

## 2022-09-01 ENCOUNTER — Ambulatory Visit: Payer: No Typology Code available for payment source | Attending: General Surgery

## 2022-09-01 VITALS — Wt 134.5 lb

## 2022-09-01 DIAGNOSIS — Z483 Aftercare following surgery for neoplasm: Secondary | ICD-10-CM | POA: Insufficient documentation

## 2022-09-01 NOTE — Therapy (Signed)
OUTPATIENT PHYSICAL THERAPY SOZO SCREENING NOTE   Patient Name: Christine [name] MRN: [id number] DOB:January 04, 1961, 62 y.o., female Today's Date: 09/01/2022  PCP: Thana Ates, MD REFERRING PROVIDER: Griselda Miner, MD   PT End of Session - 09/01/22 [phone]     Visit Number 3   # unchanged due to screen only   PT Start Time 0848    PT Stop Time 0851    PT Time Calculation (min) 3 min    Activity Tolerance Patient tolerated treatment well    Behavior During Therapy [facility] for tasks assessed/performed             Past Medical History:  Diagnosis Date   Family history of breast cancer 07/31/2021   GERD (gastroesophageal reflux disease)    Hypertension    Personal history of radiation therapy 01/08/2022   ended 02-06-22   Past Surgical History:  Procedure Laterality Date   BREAST LUMPECTOMY Left 08/09/2021   BREAST LUMPECTOMY WITH RADIOACTIVE SEED AND SENTINEL LYMPH NODE BIOPSY Left 08/09/2021   Procedure: LEFT BREAST RADIOACTIVE SEED LOCALIZED LUMPECTOMY AND SENTINEL NODE BIOPSY;  Surgeon: Griselda Miner, MD;  Location: [facility] OR;  Service: General;  Laterality: Left;  GEN & PEC BLOCK   CESAREAN SECTION     FOOT SURGERY Right    PORTACATH PLACEMENT Right 08/09/2021   Procedure: PORT PLACEMENT RIGHT  INTERNAL JUGULAR  WITH ULTRASOUND GUIDANCE;  Surgeon: Griselda Miner, MD;  Location: [facility] OR;  Service: General;  Laterality: Right;  RIGHT INTERNAL JUGULAR PLACEMENT   Patient Active Problem List   Diagnosis Date Noted   Antineoplastic chemotherapy induced anemia 10/18/2021   Port-A-Cath in place 09/12/2021   Iron deficiency anemia 09/02/2021   Genetic testing 08/05/2021   Family history of breast cancer 07/31/2021   Atherosclerotic heart disease 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 07/31/2021   Easy bruising 07/31/2021   Essential hypertension 07/31/2021   Hearing loss in left ear 07/31/2021   Insomnia 07/31/2021   Mixed hyperlipidemia 07/31/2021   Other long term  (current) drug therapy 07/31/2021   Sleep disorder 07/31/2021   Tendency toward bleeding easily (HCC) 07/31/2021   Vitamin D deficiency 07/31/2021   Malignant neoplasm of upper-outer quadrant of left breast in female, estrogen receptor positive (HCC) 07/25/2021   Abnormal finding on mammography 07/10/2021   GERD (gastroesophageal reflux disease) 08/31/2019   Globus pharyngeus 08/02/2019   Rhinitis, chronic 08/02/2019   Elevated blood pressure reading 01/26/2019   Acute pain of right knee 10/26/2017    REFERRING DIAG: left breast cancer at risk for lymphedema  THERAPY DIAG: Aftercare following surgery for neoplasm  PERTINENT HISTORY: Patient was diagnosed on 07/08/2021 with left grade 3 invasive ductal carcinoma breast cancer. It measures 7 mm and is located in the upper outer quadrant. It is ER positive, PR negative, and HER2 positive with a Ki67 of 35%. Left lumpectomy and SLNB on 08/09/21 with port placement and 3 negative lymph nodes. Underwent chemotherapy with taxol and herceptin.   PRECAUTIONS: left UE Lymphedema risk  SUBJECTIVE: Patient returns for her 3 month L-Dex screen.   PAIN:  Are you having pain? No  LYMPHEDEMA ASSESSMENTS:    Patient was assessed today using the SOZO machine to determine the lymphedema index score. This was compared to her baseline score. It was determined that she is within the recommended range when compared to her baseline and no further action is needed at this time. She will continue SOZO screenings. These are done every  3 months for 2 years post operatively followed by every 6 months for 2 years, and then annually.        L-DEX FLOWSHEETS - 09/01/22 0900       L-DEX LYMPHEDEMA SCREENING   Measurement Type Unilateral    L-DEX MEASUREMENT EXTREMITY Upper Extremity    POSITION  Standing    DOMINANT SIDE Right    At Risk Side Left    BASELINE SCORE (UNILATERAL) -2.1    L-DEX SCORE (UNILATERAL) -2.4    VALUE CHANGE (UNILAT) -0.3                 Berna Spare, PTA 09/01/22 9:56 AM

## 2022-09-02 ENCOUNTER — Ambulatory Visit: Payer: Self-pay | Admitting: General Surgery

## 2022-09-05 ENCOUNTER — Encounter: Payer: Self-pay | Admitting: Hematology and Oncology

## 2022-09-08 ENCOUNTER — Encounter: Payer: Self-pay | Admitting: Hematology and Oncology

## 2022-09-12 ENCOUNTER — Encounter (HOSPITAL_BASED_OUTPATIENT_CLINIC_OR_DEPARTMENT_OTHER): Payer: Self-pay | Admitting: General Surgery

## 2022-09-12 ENCOUNTER — Other Ambulatory Visit: Payer: Self-pay

## 2022-09-12 NOTE — Progress Notes (Signed)
   09/12/22 1534  PAT Phone Screen  Is the patient taking a GLP-1 receptor agonist? No  Do You Have Diabetes? No  Do You Have Hypertension? Yes  Have You Ever Been to the ER for Asthma? No  Have You Taken Oral Steroids in the Past 3 Months? No  Do you Take Phenteramine or any Other Diet Drugs? No  Recent  Lab Work, EKG, CXR? Yes  [facility] this test performed[facility]  Do you have a history of heart problems? Yes  Cardiologist Name Kathie Rhodes)  Christine [name] (pt consulted w/cardiology d/t elevated calcium, echo revealed stable heart function and pt instructed to manage w/meds. denies cardiac symptoms. pt follows up w/cards yearly)  Have you ever had tests on your heart? Yes  What cardiac tests were performed? Echo;Labs  What date/year were cardiac tests completed? (S)  echo 08/18/22 EF 70-75%, BMP 08/21/22  Results viewable: [facility] Media Tab;Care Everywhere  Any Recent Hospitalizations? No  Height 5\' 4"  (1.626 m)  Weight 61 kg  Pat Appointment Scheduled Yes (ensure and soap)

## 2022-09-18 MED ORDER — CHLORHEXIDINE GLUCONATE CLOTH 2 % EX PADS: 6.0000 | MEDICATED_PAD | Freq: Once | CUTANEOUS | Status: DC

## 2022-09-18 NOTE — Progress Notes (Signed)

## 2022-09-19 ENCOUNTER — Encounter (HOSPITAL_BASED_OUTPATIENT_CLINIC_OR_DEPARTMENT_OTHER)
Admission: RE | Disposition: A | Discharge status: Home / Self Care | Payer: Self-pay | Source: Home / Self Care | Attending: General Surgery

## 2022-09-19 ENCOUNTER — Ambulatory Visit (HOSPITAL_BASED_OUTPATIENT_CLINIC_OR_DEPARTMENT_OTHER): Payer: No Typology Code available for payment source | Admitting: Anesthesiology

## 2022-09-19 ENCOUNTER — Encounter (HOSPITAL_BASED_OUTPATIENT_CLINIC_OR_DEPARTMENT_OTHER): Payer: Self-pay | Admitting: General Surgery

## 2022-09-19 ENCOUNTER — Ambulatory Visit (HOSPITAL_BASED_OUTPATIENT_CLINIC_OR_DEPARTMENT_OTHER)
Admission: RE | Admit: 2022-09-19 | Discharge: 2022-09-19 | Disposition: A | Discharge status: Home / Self Care | Payer: No Typology Code available for payment source | Attending: General Surgery | Admitting: General Surgery

## 2022-09-19 DIAGNOSIS — C50412 Malignant neoplasm of upper-outer quadrant of left female breast: Secondary | ICD-10-CM | POA: Diagnosis not present

## 2022-09-19 DIAGNOSIS — Z452 Encounter for adjustment and management of vascular access device: Secondary | ICD-10-CM | POA: Diagnosis not present

## 2022-09-19 DIAGNOSIS — Z9221 Personal history of antineoplastic chemotherapy: Secondary | ICD-10-CM | POA: Insufficient documentation

## 2022-09-19 DIAGNOSIS — I251 Atherosclerotic heart disease 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: Secondary | ICD-10-CM | POA: Diagnosis not present

## 2022-09-19 DIAGNOSIS — E782 Mixed hyperlipidemia: Secondary | ICD-10-CM | POA: Diagnosis not present

## 2022-09-19 DIAGNOSIS — Z923 Personal history of irradiation: Secondary | ICD-10-CM | POA: Insufficient documentation

## 2022-09-19 DIAGNOSIS — I10 Essential (primary) hypertension: Secondary | ICD-10-CM | POA: Diagnosis not present

## 2022-09-19 DIAGNOSIS — G4700 Insomnia, unspecified: Secondary | ICD-10-CM | POA: Diagnosis not present

## 2022-09-19 DIAGNOSIS — Z853 Personal history of malignant neoplasm of breast: Secondary | ICD-10-CM | POA: Diagnosis not present

## 2022-09-19 DIAGNOSIS — K219 Gastro-esophageal reflux disease without esophagitis: Secondary | ICD-10-CM | POA: Diagnosis not present

## 2022-09-19 HISTORY — DX: Atherosclerotic heart disease 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: I25.10

## 2022-09-19 HISTORY — PX: PORT-A-CATH REMOVAL: SHX5289

## 2022-09-19 SURGERY — REMOVAL PORT-A-CATH
Anesthesia: Monitor Anesthesia Care | Site: Chest | Laterality: Right

## 2022-09-19 MED ORDER — ONDANSETRON HCL 4 MG/2ML IJ SOLN
INTRAMUSCULAR | Status: AC
  Filled 2022-09-19: qty 2

## 2022-09-19 MED ORDER — EPHEDRINE 5 MG/ML INJ
INTRAVENOUS | Status: AC
  Filled 2022-09-19: qty 5

## 2022-09-19 MED ORDER — SUCCINYLCHOLINE CHLORIDE 200 MG/10ML IV SOSY
PREFILLED_SYRINGE | INTRAVENOUS | Status: AC
  Filled 2022-09-19: qty 10

## 2022-09-19 MED ORDER — BUPIVACAINE-EPINEPHRINE 0.25% -1:200000 IJ SOLN
INTRAMUSCULAR | Status: DC | PRN
  Administered 2022-09-19: 10 mL

## 2022-09-19 MED ORDER — LIDOCAINE 2% (20 MG/ML) 5 ML SYRINGE
INTRAMUSCULAR | Status: AC
  Filled 2022-09-19: qty 5

## 2022-09-19 MED ORDER — OXYCODONE HCL 5 MG PO TABS: 5.0000 mg | ORAL_TABLET | Freq: Once | ORAL | Status: DC | PRN

## 2022-09-19 MED ORDER — FENTANYL CITRATE (PF) 100 MCG/2ML IJ SOLN
INTRAMUSCULAR | Status: AC
  Filled 2022-09-19: qty 2

## 2022-09-19 MED ORDER — PROPOFOL 500 MG/50ML IV EMUL
INTRAVENOUS | Status: DC | PRN
  Administered 2022-09-19: 150 ug/kg/min via INTRAVENOUS

## 2022-09-19 MED ORDER — PHENYLEPHRINE 80 MCG/ML (10ML) SYRINGE FOR IV PUSH (FOR BLOOD PRESSURE SUPPORT)
PREFILLED_SYRINGE | INTRAVENOUS | Status: AC
  Filled 2022-09-19: qty 10

## 2022-09-19 MED ORDER — MIDAZOLAM HCL 5 MG/5ML IJ SOLN
INTRAMUSCULAR | Status: DC | PRN
  Administered 2022-09-19: 1 mg via INTRAVENOUS

## 2022-09-19 MED ORDER — FENTANYL CITRATE (PF) 100 MCG/2ML IJ SOLN
INTRAMUSCULAR | Status: DC | PRN
  Administered 2022-09-19: 50 ug via INTRAVENOUS

## 2022-09-19 MED ORDER — FENTANYL CITRATE (PF) 100 MCG/2ML IJ SOLN: 25.0000 ug | INTRAMUSCULAR | Status: DC | PRN

## 2022-09-19 MED ORDER — ONDANSETRON HCL 4 MG/2ML IJ SOLN: 4.0000 mg | Freq: Once | INTRAMUSCULAR | Status: DC | PRN

## 2022-09-19 MED ORDER — OXYCODONE HCL 5 MG PO TABS: 5.0000 mg | ORAL_TABLET | Freq: Four times a day (QID) | ORAL | 0 refills | Status: DC | PRN

## 2022-09-19 MED ORDER — MIDAZOLAM HCL 2 MG/2ML IJ SOLN
INTRAMUSCULAR | Status: AC
  Filled 2022-09-19: qty 2

## 2022-09-19 MED ORDER — OXYCODONE HCL 5 MG/5ML PO SOLN: 5.0000 mg | Freq: Once | ORAL | Status: DC | PRN

## 2022-09-19 MED ORDER — LACTATED RINGERS IV SOLN: INTRAVENOUS | Status: DC

## 2022-09-19 MED ORDER — ONDANSETRON HCL 4 MG/2ML IJ SOLN
INTRAMUSCULAR | Status: DC | PRN
  Administered 2022-09-19: 4 mg via INTRAVENOUS

## 2022-09-19 SURGICAL SUPPLY — 30 items
ADH SKN CLS APL DERMABOND .7 (GAUZE/BANDAGES/DRESSINGS) ×1
APL PRP STRL LF DISP 70% ISPRP (MISCELLANEOUS) ×1
BLADE SURG 15 STRL LF DISP TIS (BLADE) ×2 IMPLANT
BLADE SURG 15 STRL SS (BLADE) ×1
CHLORAPREP W/TINT 26 (MISCELLANEOUS) ×2 IMPLANT
COVER BACK TABLE 60X90IN (DRAPES) ×2 IMPLANT
COVER MAYO STAND STRL (DRAPES) ×2 IMPLANT
DERMABOND ADVANCED .7 DNX12 (GAUZE/BANDAGES/DRESSINGS) ×2 IMPLANT
DRAPE LAPAROTOMY 100X72 PEDS (DRAPES) ×2 IMPLANT
DRAPE UTILITY XL STRL (DRAPES) ×2 IMPLANT
ELECT COATED BLADE 2.86 ST (ELECTRODE) IMPLANT
ELECT REM PT RETURN 9FT ADLT (ELECTROSURGICAL)
ELECTRODE REM PT RTRN 9FT ADLT (ELECTROSURGICAL) IMPLANT
GLOVE BIO SURGEON STRL SZ7.5 (GLOVE) ×2 IMPLANT
GLOVE BIOGEL PI IND STRL 7.0 (GLOVE) IMPLANT
GOWN STRL REUS W/ TWL LRG LVL3 (GOWN DISPOSABLE) ×4 IMPLANT
GOWN STRL REUS W/ TWL XL LVL3 (GOWN DISPOSABLE) IMPLANT
GOWN STRL REUS W/TWL LRG LVL3 (GOWN DISPOSABLE) ×1
GOWN STRL REUS W/TWL XL LVL3 (GOWN DISPOSABLE) ×1
NDL HYPO 25X1 1.5 SAFETY (NEEDLE) ×2 IMPLANT
NEEDLE HYPO 25X1 1.5 SAFETY (NEEDLE) ×1
PACK BASIN DAY SURGERY FS (CUSTOM PROCEDURE TRAY) ×2 IMPLANT
PENCIL SMOKE EVACUATOR (MISCELLANEOUS) IMPLANT
SLEEVE SCD COMPRESS KNEE MED (STOCKING) IMPLANT
SPIKE FLUID TRANSFER (MISCELLANEOUS) ×2 IMPLANT
SUT MON AB 4-0 PC3 18 (SUTURE) ×2 IMPLANT
SUT VIC AB 3-0 SH 27 (SUTURE) ×1
SUT VIC AB 3-0 SH 27X BRD (SUTURE) ×2 IMPLANT
SYR CONTROL 10ML LL (SYRINGE) ×2 IMPLANT
TOWEL GREEN STERILE FF (TOWEL DISPOSABLE) ×2 IMPLANT

## 2022-09-19 NOTE — Interval H&P Note (Signed)
History and Physical Interval Note:  09/19/2022 1:14 PM  Christine [name]  has presented today for surgery, with the diagnosis of LEFT BREAST CANCER.  The various methods of treatment have been discussed with the patient and family. After consideration of risks, benefits and other options for treatment, the patient has consented to  Procedure(s) with comments: REMOVAL PORT-A-CATH (N/A) - 45 MINS NEEDED IN ROOM 8 as a surgical intervention.  The patient's history has been reviewed, patient examined, no change in status, stable for surgery.  I have reviewed the patient's chart and labs.  Questions were answered to the patient's satisfaction.     Chevis Pretty III

## 2022-09-19 NOTE — Anesthesia Preprocedure Evaluation (Signed)
Anesthesia Evaluation  Patient identified by MRN, date of birth, ID band Patient awake    Reviewed: Allergy & Precautions, H&P , NPO status , Patient's Chart, lab work & pertinent test results  Airway Mallampati: II  TM Distance: >3 FB Neck ROM: Full    Dental no notable dental hx.    Pulmonary neg pulmonary ROS   Pulmonary exam normal breath sounds clear to auscultation       Cardiovascular hypertension, Pt. on medications Normal cardiovascular exam Rhythm:Regular Rate:Normal     Neuro/Psych negative neurological ROS  negative psych ROS   GI/Hepatic Neg liver ROS,GERD  ,,  Endo/Other  negative endocrine ROS    Renal/GU negative Renal ROS  negative genitourinary   Musculoskeletal negative musculoskeletal ROS (+)    Abdominal   Peds negative pediatric ROS (+)  Hematology negative hematology ROS (+)   Anesthesia Other Findings   Reproductive/Obstetrics negative OB ROS                             Anesthesia Physical Anesthesia Plan  ASA: 2  Anesthesia Plan: MAC   Post-op Pain Management: Tylenol PO (pre-op)*   Induction: Intravenous  PONV Risk Score and Plan: 2 and Propofol infusion, Ondansetron and Treatment may vary due to age or medical condition  Airway Management Planned: Simple Face Mask  Additional Equipment:   Intra-op Plan:   Post-operative Plan:   Informed Consent: I have reviewed the patients History and Physical, chart, labs and discussed the procedure including the risks, benefits and alternatives for the proposed anesthesia with the patient or authorized representative who has indicated his/her understanding and acceptance.     Dental advisory given  Plan Discussed with: CRNA and Surgeon  Anesthesia Plan Comments:        Anesthesia Quick Evaluation

## 2022-09-19 NOTE — Anesthesia Postprocedure Evaluation (Signed)
Anesthesia Post Note  Patient: Christine [name]  Procedure(s) Performed: REMOVAL PORT-A-CATH (Right: Chest)     Patient location during evaluation: PACU Anesthesia Type: MAC Level of consciousness: awake and alert Pain management: pain level controlled Vital Signs Assessment: post-procedure vital signs reviewed and stable Respiratory status: spontaneous breathing, nonlabored ventilation, respiratory function stable and patient connected to nasal cannula oxygen Cardiovascular status: stable and blood pressure returned to baseline Postop Assessment: no apparent nausea or vomiting Anesthetic complications: no  No notable events documented.  Last Vitals:  Vitals:   09/19/22 1426 09/19/22 1436  BP: (!) 124/58 128/62  Pulse: (!) 54 (!) 59  Resp: 12 14  Temp:  (!) 36.2 C  SpO2: 98% 99%    Last Pain:  Vitals:   09/19/22 1436  TempSrc:   PainSc: 0-No pain                 Giulietta Prokop S

## 2022-09-19 NOTE — Transfer of Care (Signed)
Immediate Anesthesia Transfer of Care Note  Patient: YAGMUR LINDWALL  Procedure(s) Performed: REMOVAL PORT-A-CATH (Right: Chest)  Patient Location: PACU  Anesthesia Type:MAC  Level of Consciousness: awake, alert , and oriented  Airway & Oxygen Therapy: Patient Spontanous Breathing and Patient connected to face mask oxygen  Post-op Assessment: Report given to RN and Post -op Vital signs reviewed and stable  Post vital signs: Reviewed and stable  Last Vitals:  Vitals Value Taken Time  BP    Temp    Pulse 66 09/19/22 1408  Resp    SpO2 97 % 09/19/22 1408  Vitals shown include unfiled device data.  Last Pain:  Vitals:   09/19/22 1213  TempSrc: Temporal  PainSc: 0-No pain      Patients Stated Pain Goal: 3 (09/19/22 1213)  Complications: No notable events documented.

## 2022-09-19 NOTE — Op Note (Signed)
09/19/2022  2:03 PM  PATIENT:  Christine [name]  62 y.o. female  PRE-OPERATIVE DIAGNOSIS:  LEFT BREAST CANCER  POST-OPERATIVE DIAGNOSIS:  LEFT BREAST CANCER  PROCEDURE:  Procedure(s) with comments: REMOVAL PORT-A-CATH (Right)   SURGEON:  Surgeons and Role:    * Griselda Miner, MD - Primary  PHYSICIAN ASSISTANT:   ASSISTANTS: none   ANESTHESIA:   local and IV sedation  EBL:  5 mL   BLOOD ADMINISTERED:none  DRAINS: none   LOCAL MEDICATIONS USED:  MARCAINE     SPECIMEN:  No Specimen  DISPOSITION OF SPECIMEN:  N/A  COUNTS:  YES  TOURNIQUET:  * No tourniquets in log *  DICTATION: .Dragon Dictation  After informed consent was obtained the patient was brought to the operating room and placed in the supine position on the operating table.  After adequate IV sedation had been given the patient's right chest and neck area were prepped with ChloraPrep, allowed to dry, and draped in usual sterile manner.  An appropriate timeout was performed.  The area around the port on the right chest wall was infiltrated with quarter percent Marcaine.  A small incision was then made through her previous incision with a 15 blade knife.  The incision was carried through the subcutaneous tissue sharply with a 15 blade knife until the capsule surrounding the port was opened.  The 2 anchoring stitches were divided and removed.  The port was then gently pushed out of its pocket and with gentle traction was removed from the patient without difficulty.  Pressure was held for several minutes until the area was completely hemostatic.  The tubing tract site was closed with a figure-of-eight 3-0 Vicryl stitch.  The subcutaneous tissue was closed with interrupted 3-0 Vicryl stitches.  The skin was then closed with interrupted 4-0 Monocryl subcuticular stitches.  Dermabond dressings were applied.  The patient tolerated the procedure well.  At the end of the case all needle sponge and instrument counts were correct.   The patient was then awakened and taken to recovery in stable condition.  PLAN OF CARE: Discharge to home after PACU  PATIENT DISPOSITION:  PACU - hemodynamically stable.   Delay start of Pharmacological VTE agent (>24hrs) due to surgical blood loss or risk of bleeding: not applicable

## 2022-09-19 NOTE — H&P (Signed)
PROVIDER: Lindell Noe, MD  MRN: [id number] DOB: [birth year] Subjective   Chief Complaint: FOLLOW UP VISIT (LFT BREAST)   History of Present Illness: Christine [name] is a 62 y.o. female who is seen today for left breast cancer. The patient is a 62 year old white female who is about 1 year status post left breast lumpectomy and sentinel node biopsy for a T1CN0 left breast cancer that was ER positive and PR negative and HER2 positive with a Ki-67 of 35%. She tolerated the surgery well. She denies any significant pain. She was treated with chemotherapy, radiation therapy, and tamoxifen. She just finished her last dose of Herceptin. She is ready to get her port out.    Review of Systems: A complete review of systems was obtained from the patient. I have reviewed this information and discussed as appropriate with the patient. See HPI as well for other ROS.  ROS   Medical History: Past Medical History:  Diagnosis Date  Hyperlipidemia  Hypertension   Patient Active Problem List  Diagnosis  Other long term (current) drug therapy  Mixed hyperlipidemia  Malignant neoplasm of upper-outer quadrant of left breast in female, estrogen receptor positive (CMS/HHS-HCC)  Gastroesophageal reflux disease without esophagitis  Essential hypertension  Atherosclerotic heart disease 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  Chronic cough  Globus pharyngeus  Insomnia  Sleep disorder  Vitamin D deficiency  Hearing loss in left ear  Easy bruising  Tendency toward bleeding easily (CMS-HCC)   Past Surgical History:  Procedure Laterality Date  CESAREAN SECTION Left  Unknown date  foot surgery Right  Unknown date    Allergies  Allergen Reactions  Lisinopril Other (See Comments)  Mefloquine Other (See Comments)   Current Outpatient Medications on File Prior to Visit  Medication Sig Dispense Refill  aspirin 81 MG EC tablet Take 1 tablet by mouth once daily  rosuvastatin (CRESTOR)  40 MG tablet Take 40 mg by mouth once daily  valsartan-hydroCHLOROthiazide (DIOVAN-HCT) 80-12.5 mg tablet Take 1 tablet by mouth once daily  zolpidem (AMBIEN) 10 mg tablet Take 5-10 mg by mouth at bedtime as needed  omeprazole (PRILOSEC) 40 MG DR capsule Take 1 capsule by mouth once daily as needed (Patient not taking: Reported on 09/02/2022)   No current facility-administered medications on file prior to visit.   Family History  Problem Relation Age of Onset  Coronary Artery Disease (Blocked arteries around heart) Father  Hyperlipidemia (Elevated cholesterol) Father  High blood pressure (Hypertension) Father  Breast cancer Sister    Social History   Tobacco Use  Smoking Status Never  Smokeless Tobacco Never    Social History   Socioeconomic History  Marital status: Married  Tobacco Use  Smoking status: Never  Smokeless tobacco: Never  Vaping Use  Vaping status: Never Used  Substance and Sexual Activity  Alcohol use: Yes  Comment: glass of wine/beer occassionally  Drug use: Never  Sexual activity: Defer   Objective:   Vitals:  BP: 130/79  Pulse: 61  Temp: 36.8 C (98.2 F)  SpO2: 99%  Weight: 61.9 kg (136 lb 6.4 oz)  Height: 162.6 cm (5\' 4" )  PainSc: 0-No pain   Body mass index is 23.41 kg/m.  Physical Exam Vitals reviewed.  Constitutional:  General: She is not in acute distress. Appearance: Normal appearance.  HENT:  Head: Normocephalic and atraumatic.  Right Ear: External ear normal.  Left Ear: External ear normal.  Nose: Nose normal.  Mouth/Throat:  Mouth: Mucous membranes are moist.  Pharynx: Oropharynx  is clear.  Eyes:  General: No scleral icterus. Extraocular Movements: Extraocular movements intact.  Conjunctiva/sclera: Conjunctivae normal.  Pupils: Pupils are equal, round, and reactive to light.  Cardiovascular:  Rate and Rhythm: Normal rate and regular rhythm.  Pulses: Normal pulses.  Heart sounds: Normal heart sounds.  Pulmonary:   Effort: Pulmonary effort is normal. No respiratory distress.  Breath sounds: Normal breath sounds.  Abdominal:  General: Bowel sounds are normal.  Palpations: Abdomen is soft.  Tenderness: There is no abdominal tenderness.  Musculoskeletal:  General: No swelling, tenderness or deformity. Normal range of motion.  Cervical back: Normal range of motion and neck supple.  Skin: General: Skin is warm and dry.  Coloration: Skin is not jaundiced.  Neurological:  General: No focal deficit present.  Mental Status: She is alert and oriented to person, place, and time.  Psychiatric:  Mood and Affect: Mood normal.  Behavior: Behavior normal.     Breast: The left breast periareolar and axillary incisions are healing nicely with no sign of infection or seroma. The port site looks good. There is no palpable mass in either breast. There is no palpable axillary, supraclavicular, or cervical lymphadenopathy. There is some thickening of the left breast compared to the right likely secondary to radiation.  Labs, Imaging and Diagnostic Testing:  Assessment and Plan:   Diagnoses and all orders for this visit:  Malignant neoplasm of upper-outer quadrant of left breast in female, estrogen receptor positive (CMS/HHS-HCC)    The patient is about near status post left breast lumpectomy for breast cancer. She tolerated the surgery well. At this point she will continue to do regular self checks. She will continue to take tamoxifen. We will plan to remove her port. I have discussed with her in detail the risks and benefits of the operation as well as some of the technical aspects and she understands and wishes to proceed. She would like some sedation. I will plan to see her back in about 6 months.

## 2022-09-19 NOTE — Discharge Instructions (Signed)

## 2022-09-22 ENCOUNTER — Encounter (HOSPITAL_BASED_OUTPATIENT_CLINIC_OR_DEPARTMENT_OTHER): Payer: Self-pay | Admitting: General Surgery

## 2022-09-25 ENCOUNTER — Encounter: Payer: Self-pay | Admitting: Hematology and Oncology

## 2022-10-27 ENCOUNTER — Encounter: Payer: Self-pay | Admitting: Hematology and Oncology

## 2022-11-13 ENCOUNTER — Other Ambulatory Visit (HOSPITAL_BASED_OUTPATIENT_CLINIC_OR_DEPARTMENT_OTHER): Payer: Self-pay | Admitting: Cardiology

## 2022-11-13 DIAGNOSIS — I10 Essential (primary) hypertension: Secondary | ICD-10-CM

## 2022-11-24 ENCOUNTER — Encounter: Payer: Self-pay | Admitting: Hematology and Oncology

## 2022-11-24 NOTE — Progress Notes (Unsigned)
Symptom Management Consult Note [facility]    Patient Care Team: Thana Ates, MD as PCP - General (Internal Medicine) Jodelle Red, MD as PCP - Cardiology (Cardiology) Donnelly Angelica, RN as Oncology Nurse Navigator Pershing Proud, RN as Oncology Nurse Navigator Griselda Miner, MD as Consulting Physician (General Surgery) Rachel Moulds, MD as Consulting Physician (Hematology and Oncology) Dorothy Puffer, MD as Consulting Physician (Radiation Oncology)    Name / MRN / DOB: Christine [name]  [id number]  01-Apr-1960   Date of visit: 11/25/2022   Chief Complaint/Reason for visit: Right breast lump   Current Therapy: Tamoxifen daily   ASSESSMENT & PLAN: Patient is a 62 y.o. female with oncologic history of Malignant neoplasm of upper-outer quadrant of left breast in female, estrogen receptor positive followed by Dr. Al Pimple.  I have viewed most recent oncology note and lab work.    #Malignant neoplasm of upper-outer quadrant of left breast in female, estrogen receptor positive  - Next appointment with oncologist is 02/24/23 -Currently tolerating Tamoxifen well overall. Joint aching possibly related to Tamoxifen use. No limitations in daily activities or exercise. Will continue Tamoxifen as prescribed. -Patient will notify oncologist if joint aching worsens or becomes limiting.  #Right Breast Mass New palpable mass in the right breast with a history of left breast cancer. No associated pain, swelling, or nipple changes. Last mammogram in June was normal. -Order right breast mammogram and ultrasound to further evaluate the mass. -Schedule a follow-up phone visit to discuss the results and any new symptoms.  Strict ED precautions discussed should symptoms worsen.   Heme/Onc History: Oncology History  Malignant neoplasm of upper-outer quadrant of left breast in female, estrogen receptor positive (HCC)  07/17/2021 Mammogram   Suspicious left breast mass  at 3 o'clock, 4 cm from the nipple. No axillary adenopathy. Targeted ultrasound is performed, showing an irregular centrally hypoechoic mass with an echogenic rim measuring 7 x 5 by 7 mm, thought to correlate with the mammographically identified mass. This mass is located at 3 o'clock, 4 cm from the nipple. No axillary adenopathy.   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female, estrogen receptor positive (HCC)   07/29/2021 Cancer Staging   Staging form: Breast, AJCC 8th Edition - Clinical stage from 07/29/2021: Stage IA (cT1c, cN0, cM0, G3, ER+, PR-, HER2+) - Signed by Ronny Bacon, PA-C on 07/31/2021 Stage prefix: Initial diagnosis Method of lymph node assessment: Clinical Histologic grading system: 3 grade system    Pathology Results   Path showed IDC, grade 3, ER 100% positive, strong staining intensity, PR negative, ki 67 35%, Her 2 positive   08/05/2021 Genetic Testing   Negative hereditary cancer genetic testing: no pathogenic variants detected in [employer] BRCAPlus Panel and [employer] CustomNext-Cancer +RNAinsight Panel.  Report dates are August 05, 2021 and August 08, 2021.    The BRCAplus panel offered by [employer] and includes sequencing and deletion/duplication analysis for the following 8 genes: ATM, BRCA1, BRCA2, CDH1, CHEK2, PALB2, PTEN, and TP53. The CustomNext-Cancer+RNAinsight panel offered by [employer] includes sequencing and rearrangement analysis for the following 47 genes:  APC, ATM, AXIN2, BARD1, BMPR1A, BRCA1, BRCA2, BRIP1, CDH1, CDK4, CDKN2A, CHEK2, DICER1, EPCAM, GREM1, HOXB13, MEN1, MLH1, MSH2, MSH3, MSH6, MUTYH, NBN, NF1, NF2, NTHL1, PALB2, PMS2, POLD1, POLE, PTEN, RAD51C, RAD51D, RECQL, RET, SDHA, SDHAF2, SDHB, SDHC, SDHD, SMAD4, SMARCA4, STK11, TP53, TSC1, TSC2, and VHL.  RNA data is routinely analyzed  for use in variant interpretation for all genes.   08/09/2021 Surgery   Left lumpectomy: IDC, grade 3, 3  sentinel lymph nodes all negative for cancer.  He has biomarker testing ER positive, PR negative, HER2 positive.  pT1c, PN0.   09/12/2021 -  Adjuvant Chemotherapy   Taxol/Herceptin weekly x12, followed by Herceptin given every 3 weeks to complete 1 year of treatment.       Interval history-: Christine [name] is a 62 y.o. female with oncologic history as above presenting to [facility] today with chief complaint of concerns new right breast lump.  Discussed the use of AI scribe software for clinical note transcription with the patient, who gave verbal consent to proceed.  Patient reports a textured area in the right breast. She noticed this change after waking up with scratches in the area, which she believes to have inflicted during sleep. The patient describes the area as feeling 'ropey' compared to the other side. She denies any associated pain, swelling, or changes to the nipple. She is unsure how long this has been present as she has not had breast exam recently.  The patient also reports joint aching, which she suspects may be a side effect of tamoxifen. Despite the discomfort, she continues to maintain her workout routine, indicating that the aching is not significantly limiting her daily activities.  In addition to these concerns, the patient mentions a slight weight loss, which she attributes to efforts to improve her A1c levels. She denies any new symptoms such as night sweats and reports a normal appetite.       ROS  All other systems are reviewed and are negative for acute change except as noted in the HPI.    Allergies  Allergen Reactions   Lisinopril Cough    Night time cough   Mefloquine Other (See Comments)    Nightmares      Past Medical History:  Diagnosis Date   Coronary artery disease    Family history of breast cancer 07/31/2021   GERD (gastroesophageal reflux disease)    Hypertension    Personal history of radiation therapy 01/08/2022   ended 02-06-22     Past  Surgical History:  Procedure Laterality Date   BREAST LUMPECTOMY Left 08/09/2021   BREAST LUMPECTOMY WITH RADIOACTIVE SEED AND SENTINEL LYMPH NODE BIOPSY Left 08/09/2021   Procedure: LEFT BREAST RADIOACTIVE SEED LOCALIZED LUMPECTOMY AND SENTINEL NODE BIOPSY;  Surgeon: Griselda Miner, MD;  Location: [facility] OR;  Service: General;  Laterality: Left;  GEN & PEC BLOCK   CESAREAN SECTION     FOOT SURGERY Right    PORT-A-CATH REMOVAL Right 09/19/2022   Procedure: REMOVAL PORT-A-CATH;  Surgeon: Griselda Miner, MD;  Location: [facility];  Service: General;  Laterality: Right;  45 MINS NEEDED IN ROOM 8   PORTACATH PLACEMENT Right 08/09/2021   Procedure: PORT PLACEMENT RIGHT  INTERNAL JUGULAR  WITH ULTRASOUND GUIDANCE;  Surgeon: Griselda Miner, MD;  Location: [facility] OR;  Service: General;  Laterality: Right;  RIGHT INTERNAL JUGULAR PLACEMENT    Social History   Socioeconomic History   Marital status: Married    Spouse name: Not on file   Number of children: Not on file   Years of education: Not on file   Highest education level: Not on file  Occupational History   Not on file  Tobacco Use   Smoking status: Never   Smokeless tobacco: Never  Vaping Use   Vaping status: Never Used  Substance  and Sexual Activity   Alcohol use: Yes    Alcohol/week: 2.0 standard drinks of alcohol    Types: 2 Glasses of wine per week   Drug use: Never   Sexual activity: Not on file  Other Topics Concern   Not on file  Social History Narrative   Not on file   Social Determinants of Health   Financial Resource Strain: Not on file  Food Insecurity: Not on file  Transportation Needs: Not on file  Physical Activity: Not on file  Stress: Not on file  Social Connections: Not on file  Intimate Partner Violence: Not on file    Family History  Problem Relation Age of Onset   Heart disease Father    Breast cancer Sister 14   Esophageal cancer Maternal Uncle        dx after age 52   Lung cancer  Paternal Uncle    Skin cancer Paternal Uncle      Current Outpatient Medications:    acetaminophen (TYLENOL) 500 MG tablet, Take 1,000 mg by mouth every 6 (six) hours as needed for moderate pain., Disp: , Rfl:    aspirin EC 81 MG tablet, Take 81 mg by mouth daily. Swallow whole., Disp: , Rfl:    betamethasone valerate ointment (VALISONE) 0.1 %, Apply 1 Application topically 2 (two) times daily., Disp: 30 g, Rfl: 0   Cholecalciferol (DIALYVITE VITAMIN D 5000) 125 MCG (5000 UT) capsule, Take 15,000 Units by mouth every Monday., Disp: , Rfl:    lidocaine-prilocaine (EMLA) cream, Apply to affected area once, Disp: 30 g, Rfl: 3   MAGNESIUM PO, Take 150 mg by mouth 3 (three) times a week., Disp: , Rfl:    Omega-3 Fatty Acids (OMEGA 3 500) 500 MG CAPS, Take 500 mg by mouth daily., Disp: , Rfl:    oxyCODONE (ROXICODONE) 5 MG immediate release tablet, Take 1 tablet (5 mg total) by mouth every 6 (six) hours as needed for severe pain., Disp: 5 tablet, Rfl: 0   rosuvastatin (CRESTOR) 40 MG tablet, TAKE 1 TABLET BY MOUTH EVERY DAY, Disp: 90 tablet, Rfl: 1   tamoxifen (NOLVADEX) 20 MG tablet, Take 1 tablet (20 mg total) by mouth daily., Disp: 90 tablet, Rfl: 3   valsartan-hydrochlorothiazide (DIOVAN-HCT) 80-12.5 MG tablet, TAKE 1 TABLET BY MOUTH EVERY DAY, Disp: 90 tablet, Rfl: 3   zolpidem (AMBIEN) 10 MG tablet, Take 5-10 mg by mouth at bedtime as needed for sleep., Disp: , Rfl:    nitroGLYCERIN (NITROSTAT) 0.4 MG SL tablet, Place 1 tablet (0.4 mg total) under the tongue every 5 (five) minutes as needed for chest pain., Disp: 90 tablet, Rfl: 3  PHYSICAL EXAM: ECOG FS:1 - Symptomatic but completely ambulatory    Vitals:   11/25/22 1010  BP: 120/65  Pulse: 80  Resp: 16  Temp: 97.8 F (36.6 C)  TempSrc: Temporal  SpO2: 100%  Weight: 137 lb 5 oz (62.3 kg)  Height: 5\' 4"  (1.626 m)   Physical Exam Vitals and nursing note reviewed.  Constitutional:      Appearance: She is not ill-appearing or  toxic-appearing.  HENT:     Head: Normocephalic.  Eyes:     Conjunctiva/sclera: Conjunctivae normal.  Cardiovascular:     Rate and Rhythm: Normal rate.     Pulses: Normal pulses.  Pulmonary:     Effort: Pulmonary effort is normal.  Chest:     Comments: Scratches on right breast in two horizontal lines , not consistent with shingles. No swelling,  pain, or changes to nipple. 'Ropey' lump palpated in 7 o'clock position. Dense breast tissue palpated in upper part of breast, with scattered fibroglandular density.  No tenderness. No axillary lymphadenopathy. Abdominal:     General: There is no distension.  Musculoskeletal:     Cervical back: Normal range of motion.  Skin:    General: Skin is warm and dry.  Neurological:     Mental Status: She is alert.       LABORATORY DATA: I have reviewed the data as listed    Latest Ref Rng & Units 08/21/2022    8:33 AM 07/10/2022    8:09 AM 04/17/2022    8:50 AM  CBC  WBC 4.0 - 10.5 K/uL 5.7  6.6  4.6   Hemoglobin 12.0 - 15.0 g/dL 16.1  09.6  04.5   Hematocrit 36.0 - 46.0 % 34.5  33.5  31.9   Platelets 150 - 400 K/uL 227  220  193         Latest Ref Rng & Units 08/21/2022    8:33 AM 07/10/2022    8:09 AM 04/17/2022    8:50 AM  CMP  Glucose 70 - 99 mg/dL 80  409  811   BUN 8 - 23 mg/dL 16  22  18    Creatinine 0.44 - 1.00 mg/dL 9.14  7.82  9.56   Sodium 135 - 145 mmol/L 141  138  140   Potassium 3.5 - 5.1 mmol/L 4.0  4.0  3.7   Chloride 98 - 111 mmol/L 108  106  107   CO2 22 - 32 mmol/L 29  28  29    Calcium 8.9 - 10.3 mg/dL 8.6  9.1  8.6   Total Protein 6.5 - 8.1 g/dL 6.3  6.5  6.3   Total Bilirubin 0.3 - 1.2 mg/dL 0.7  0.9  0.8   Alkaline Phos 38 - 126 U/L 33  35  38   AST 15 - 41 U/L 21  25  24    ALT 0 - 44 U/L 21  28  20         RADIOGRAPHIC STUDIES (from last 24 hours if applicable) I have personally reviewed the radiological images as listed and agreed with the findings in the report. No results found.      Visit  Diagnosis: 1. Breast lump on right side at 7 o'clock position   2. Malignant neoplasm of upper-outer quadrant of left breast in female, estrogen receptor positive (HCC)      Orders Placed This Encounter  Procedures   MM DIAG BREAST TOMO UNI RIGHT    Standing Status:   Future    Standing Expiration Date:   11/25/2023    Order Specific Question:   Reason for Exam (SYMPTOM  OR DIAGNOSIS REQUIRED)    Answer:   right breast lump, history of left breast cancer on tamoxifen    Order Specific Question:   Preferred imaging location?    Answer:   [facility]   US BREAST COMPLETE UNI RIGHT INC AXILLA    Standing Status:   Future    Standing Expiration Date:   11/25/2023    Order Specific Question:   Reason for Exam (SYMPTOM  OR DIAGNOSIS REQUIRED)    Answer:   new right breast lump, history left breast cancer on tamoxifen    Order Specific Question:   Preferred imaging location?    Answer:   [facility]    All questions were answered. The patient knows  to call the clinic with any problems, questions or concerns. No barriers to learning was detected.  A total of more than 30 minutes were spent on this encounter with face-to-face time and non-face-to-face time, including preparing to see the patient, ordering tests, counseling the patient and coordination of care as outlined above.    Thank you for allowing me to participate in the care of this patient.    Shanon Ace, PA-C Department of Hematology/Oncology [facility] at [facility] Phone: [phone]  [phone]    11/25/2022 11:27 AM

## 2022-11-25 ENCOUNTER — Inpatient Hospital Stay
Payer: No Typology Code available for payment source | Attending: Hematology and Oncology | Admitting: Physician Assistant

## 2022-11-25 VITALS — BP 120/65 | HR 80 | Temp 97.8°F | Resp 16 | Ht 64.0 in | Wt 137.3 lb

## 2022-11-25 DIAGNOSIS — Z79899 Other long term (current) drug therapy: Secondary | ICD-10-CM | POA: Diagnosis not present

## 2022-11-25 DIAGNOSIS — C50412 Malignant neoplasm of upper-outer quadrant of left female breast: Secondary | ICD-10-CM | POA: Diagnosis present

## 2022-11-25 DIAGNOSIS — I10 Essential (primary) hypertension: Secondary | ICD-10-CM | POA: Diagnosis not present

## 2022-11-25 DIAGNOSIS — I251 Atherosclerotic heart disease 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: Secondary | ICD-10-CM | POA: Diagnosis not present

## 2022-11-25 DIAGNOSIS — N6313 Unspecified lump in the right breast, lower outer quadrant: Secondary | ICD-10-CM

## 2022-11-25 DIAGNOSIS — Z923 Personal history of irradiation: Secondary | ICD-10-CM | POA: Diagnosis not present

## 2022-11-25 DIAGNOSIS — Z170 Estrogen receptor positive status [ER+]: Secondary | ICD-10-CM | POA: Diagnosis not present

## 2022-11-25 DIAGNOSIS — K219 Gastro-esophageal reflux disease without esophagitis: Secondary | ICD-10-CM | POA: Diagnosis not present

## 2022-11-25 DIAGNOSIS — Z7982 Long term (current) use of aspirin: Secondary | ICD-10-CM | POA: Insufficient documentation

## 2022-11-25 DIAGNOSIS — Z79810 Long term (current) use of selective estrogen receptor modulators (SERMs): Secondary | ICD-10-CM | POA: Diagnosis not present

## 2022-11-28 ENCOUNTER — Encounter: Payer: Self-pay | Admitting: Physician Assistant

## 2022-12-01 ENCOUNTER — Ambulatory Visit: Payer: No Typology Code available for payment source | Attending: General Surgery

## 2022-12-01 VITALS — Wt 132.0 lb

## 2022-12-01 DIAGNOSIS — Z483 Aftercare following surgery for neoplasm: Secondary | ICD-10-CM | POA: Insufficient documentation

## 2022-12-01 NOTE — Therapy (Signed)
OUTPATIENT PHYSICAL THERAPY SOZO SCREENING NOTE   Patient Name: Christine [name] MRN: [id number] DOB:1960/09/04, 62 y.o., female Today's Date: 12/01/2022  PCP: Thana Ates, MD REFERRING PROVIDER: Griselda Miner, MD   PT End of Session - 12/01/22 [phone]     Visit Number 3   # unhanged due to screen only   PT Start Time 0859    PT Stop Time 0903    PT Time Calculation (min) 4 min    Activity Tolerance Patient tolerated treatment well    Behavior During Therapy [facility] for tasks assessed/performed             Past Medical History:  Diagnosis Date   Coronary artery disease    Family history of breast cancer 07/31/2021   GERD (gastroesophageal reflux disease)    Hypertension    Personal history of radiation therapy 01/08/2022   ended 02-06-22   Past Surgical History:  Procedure Laterality Date   BREAST LUMPECTOMY Left 08/09/2021   BREAST LUMPECTOMY WITH RADIOACTIVE SEED AND SENTINEL LYMPH NODE BIOPSY Left 08/09/2021   Procedure: LEFT BREAST RADIOACTIVE SEED LOCALIZED LUMPECTOMY AND SENTINEL NODE BIOPSY;  Surgeon: Griselda Miner, MD;  Location: [facility] OR;  Service: General;  Laterality: Left;  GEN & PEC BLOCK   CESAREAN SECTION     FOOT SURGERY Right    PORT-A-CATH REMOVAL Right 09/19/2022   Procedure: REMOVAL PORT-A-CATH;  Surgeon: Griselda Miner, MD;  Location: [facility];  Service: General;  Laterality: Right;  45 MINS NEEDED IN ROOM 8   PORTACATH PLACEMENT Right 08/09/2021   Procedure: PORT PLACEMENT RIGHT  INTERNAL JUGULAR  WITH ULTRASOUND GUIDANCE;  Surgeon: Griselda Miner, MD;  Location: [facility];  Service: General;  Laterality: Right;  RIGHT INTERNAL JUGULAR PLACEMENT   Patient Active Problem List   Diagnosis Date Noted   Antineoplastic chemotherapy induced anemia 10/18/2021   Port-A-Cath in place 09/12/2021   Iron deficiency anemia 09/02/2021   Genetic testing 08/05/2021   Family history of breast cancer 07/31/2021   Atherosclerotic heart disease of native  coronary artery without angina pectoris 07/31/2021   Easy bruising 07/31/2021   Essential hypertension 07/31/2021   Hearing loss in left ear 07/31/2021   Insomnia 07/31/2021   Mixed hyperlipidemia 07/31/2021   Other long term (current) drug therapy 07/31/2021   Sleep disorder 07/31/2021   Tendency toward bleeding easily (HCC) 07/31/2021   Vitamin D deficiency 07/31/2021   Malignant neoplasm of upper-outer quadrant of left breast in female, estrogen receptor positive (HCC) 07/25/2021   Abnormal finding on mammography 07/10/2021   GERD (gastroesophageal reflux disease) 08/31/2019   Globus pharyngeus 08/02/2019   Rhinitis, chronic 08/02/2019   Elevated blood pressure reading 01/26/2019   Acute pain of right knee 10/26/2017    REFERRING DIAG: left breast cancer at risk for lymphedema  THERAPY DIAG: Aftercare following surgery for neoplasm  PERTINENT HISTORY: Patient was diagnosed on 07/08/2021 with left grade 3 invasive ductal carcinoma breast cancer. It measures 7 mm and is located in the upper outer quadrant. It is ER positive, PR negative, and HER2 positive with a Ki67 of 35%. Left lumpectomy and SLNB on 08/09/21 with port placement and 3 negative lymph nodes. Underwent chemotherapy with taxol and herceptin.   PRECAUTIONS: left UE Lymphedema risk  SUBJECTIVE: Patient returns for her 3 month L-Dex screen.   PAIN:  Are you having pain? No  LYMPHEDEMA ASSESSMENTS:    Patient was assessed today using the SOZO machine to determine the lymphedema  index score. This was compared to her baseline score. It was determined that she is within the recommended range when compared to her baseline and no further action is needed at this time. She will continue SOZO screenings. These are done every 3 months for 2 years post operatively followed by every 6 months for 2 years, and then annually.        L-DEX FLOWSHEETS - 12/01/22 0900       L-DEX LYMPHEDEMA SCREENING   Measurement Type Unilateral     L-DEX MEASUREMENT EXTREMITY Upper Extremity    POSITION  Standing    DOMINANT SIDE Right    At Risk Side Left    BASELINE SCORE (UNILATERAL) -2.1    L-DEX SCORE (UNILATERAL) -2    VALUE CHANGE (UNILAT) 0.1                Berna Spare, PTA 12/01/22 9:17 AM

## 2022-12-02 ENCOUNTER — Other Ambulatory Visit: Payer: Self-pay

## 2022-12-10 ENCOUNTER — Ambulatory Visit
Admission: RE | Admit: 2022-12-10 | Discharge: 2022-12-10 | Disposition: A | Discharge status: Home / Self Care | Payer: No Typology Code available for payment source | Source: Ambulatory Visit | Attending: Physician Assistant

## 2022-12-10 ENCOUNTER — Inpatient Hospital Stay
Admission: RE | Admit: 2022-12-10 | Discharge: 2022-12-10 | Discharge status: Home / Self Care | Payer: No Typology Code available for payment source | Source: Ambulatory Visit | Attending: Physician Assistant

## 2022-12-10 DIAGNOSIS — N6313 Unspecified lump in the right breast, lower outer quadrant: Secondary | ICD-10-CM

## 2022-12-11 ENCOUNTER — Ambulatory Visit (INDEPENDENT_AMBULATORY_CARE_PROVIDER_SITE_OTHER): Payer: No Typology Code available for payment source | Admitting: Cardiology

## 2022-12-11 ENCOUNTER — Encounter (HOSPITAL_BASED_OUTPATIENT_CLINIC_OR_DEPARTMENT_OTHER): Payer: Self-pay | Admitting: Cardiology

## 2022-12-11 VITALS — BP 110/68 | HR 73 | Ht 64.0 in | Wt 136.3 lb

## 2022-12-11 DIAGNOSIS — E7800 Pure hypercholesterolemia, unspecified: Secondary | ICD-10-CM

## 2022-12-11 DIAGNOSIS — I10 Essential (primary) hypertension: Secondary | ICD-10-CM

## 2022-12-11 DIAGNOSIS — I251 Atherosclerotic heart disease 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: Secondary | ICD-10-CM | POA: Diagnosis not present

## 2022-12-11 DIAGNOSIS — I2584 Coronary atherosclerosis due to calcified coronary lesion: Secondary | ICD-10-CM

## 2022-12-11 DIAGNOSIS — Z7189 Other specified counseling: Secondary | ICD-10-CM | POA: Diagnosis not present

## 2022-12-11 NOTE — Progress Notes (Signed)
  Cardiology Office Note:  .   Date:  12/11/2022  ID:  [name], DOB 1960-02-21, MRN [id number] PCP: Thana Ates, MD  [facility] Providers Cardiologist:  Jodelle Red, MD {  History of Present Illness: .   Christine [name] is a 62 y.o. female with a hx of hypertension, prediabetes, anemia, GERD, breast cancer s/p lumpectomy, osteopenia, who is seen for follow up today.  Cardiac history: calcium score 09/2019 was 528. ETT was indeterminate. Coronary CT 11/2019 showed Cadrads 3 disease with concerning stenosis in proximal LAD. FFR showed that there was no significant stenosis in the proximal LAD, but there was a gradual decrease (delta not significant) in the mid LAD (0.96 prox to 0.86 after D1 and 0.78 after D2). Managed medically.  Today: Doing well overall. Done with breast cancer treatment. Tolerating medications well. Able to be active, working out 4x/week, stays busy during the day, watches her two grandsons ages 1 and 3.   ROS: Denies chest pain, shortness of breath at rest or with normal exertion. No PND, orthopnea, LE edema or unexpected weight gain. No syncope or palpitations. ROS otherwise negative except as noted.   Studies Reviewed: Marland Kitchen    EKG:       Physical Exam:   VS:  BP 110/68   Pulse 73   Ht 5\' 4"  (1.626 m)   Wt 136 lb 4.8 oz (61.8 kg)   SpO2 98%   BMI 23.40 kg/m    Wt Readings from Last 3 Encounters:  12/11/22 136 lb 4.8 oz (61.8 kg)  12/01/22 132 lb (59.9 kg)  11/25/22 137 lb 5 oz (62.3 kg)    GEN: Well nourished, well developed in no acute distress HEENT: Normal, moist mucous membranes NECK: No JVD CARDIAC: regular rhythm, normal S1 and S2, no rubs or gallops. No murmur. VASCULAR: Radial and DP pulses 2+ bilaterally. No carotid bruits RESPIRATORY:  Clear to auscultation without rales, wheezing or rhonchi  ABDOMEN: Soft, non-tender, non-distended MUSCULOSKELETAL:  Ambulates independently SKIN: Warm and dry, no edema NEUROLOGIC:   Alert and oriented x 3. No focal neuro deficits noted. PSYCHIATRIC:  Normal affect    ASSESSMENT AND PLAN: .    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 -ETT with indeterminate ST changes, predominantly upsloping but abnormal test -echo normal -no chest pain. We discussed indications for cath -counseled on red flag warning signs that need immediate medical attention -continue aspirin, statin -has SL nitroglycerin, instructed on use   Hypercholesterolemia: -continue statin -lipids from [facility] reviewed, LDL 39 -discussed checking lpa today, she is amenable   Hypertension: -continue valsartan-HCTZ  CV risk counseling and prevention -recommend heart healthy/Mediterranean diet, with whole grains, fruits, vegetable, fish, lean meats, nuts, and olive oil. Limit salt. -recommend moderate walking, 3-5 times/week for 30-50 minutes each session. Aim for at least 150 minutes.week. Goal should be pace of 3 miles/hours, or walking 1.5 miles in 30 minutes -recommend avoidance of tobacco products. Avoid excess alcohol.  Dispo: 12 mos  Signed, Jodelle Red, MD   Jodelle Red, MD, PhD, [facility] [place]  [facility]  [place]  Heart & Vascular at [facility] at [facility] [address], [address] [place], [place] [address] [phone]

## 2022-12-11 NOTE — Patient Instructions (Addendum)
Medication Instructions:  Your physician recommends that you continue on your current medications as directed. Please refer to the Current Medication list given to you today.  *If you need a refill on your cardiac medications before your next appointment, please call your pharmacy*  Lab Work: LPa today   Testing/Procedures: NONE  Follow-Up: At [facility], you and your health needs are our priority.  As part of our continuing mission to provide you with exceptional heart care, we have created designated Provider Care Teams.  These Care Teams include your primary Cardiologist (physician) and Advanced Practice Providers (APPs -  Physician Assistants and Nurse Practitioners) who all work together to provide you with the care you need, when you need it.  We recommend signing up for the patient portal called "MyChart".  Sign up information is provided on this After Visit Summary.  MyChart is used to connect with patients for Virtual Visits (Telemedicine).  Patients are able to view lab/test results, encounter notes, upcoming appointments, etc.  Non-urgent messages can be sent to your provider as well.   To learn more about what you can do with MyChart, go to [email or web address].    Your next appointment:   12 month(s)  The format for your next appointment:   In Person  Provider:   Jodelle Red, MD or Ronn Melena NP

## 2022-12-12 ENCOUNTER — Inpatient Hospital Stay
Payer: No Typology Code available for payment source | Attending: Hematology and Oncology | Admitting: Physician Assistant

## 2022-12-12 DIAGNOSIS — Z7982 Long term (current) use of aspirin: Secondary | ICD-10-CM | POA: Diagnosis not present

## 2022-12-12 DIAGNOSIS — K219 Gastro-esophageal reflux disease without esophagitis: Secondary | ICD-10-CM | POA: Diagnosis not present

## 2022-12-12 DIAGNOSIS — I10 Essential (primary) hypertension: Secondary | ICD-10-CM | POA: Diagnosis not present

## 2022-12-12 DIAGNOSIS — I251 Atherosclerotic heart disease 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: Secondary | ICD-10-CM | POA: Insufficient documentation

## 2022-12-12 DIAGNOSIS — Z170 Estrogen receptor positive status [ER+]: Secondary | ICD-10-CM | POA: Diagnosis not present

## 2022-12-12 DIAGNOSIS — Z801 Family history of malignant neoplasm of trachea, bronchus and lung: Secondary | ICD-10-CM | POA: Insufficient documentation

## 2022-12-12 DIAGNOSIS — Z923 Personal history of irradiation: Secondary | ICD-10-CM | POA: Insufficient documentation

## 2022-12-12 DIAGNOSIS — Z803 Family history of malignant neoplasm of breast: Secondary | ICD-10-CM | POA: Insufficient documentation

## 2022-12-12 DIAGNOSIS — Z79899 Other long term (current) drug therapy: Secondary | ICD-10-CM | POA: Insufficient documentation

## 2022-12-12 DIAGNOSIS — C50412 Malignant neoplasm of upper-outer quadrant of left female breast: Secondary | ICD-10-CM | POA: Diagnosis present

## 2022-12-12 NOTE — Progress Notes (Signed)
I connected with Christine [name] on 12/12/22 at  9:00 AM EST by telephone and verified that I am speaking with the correct person using two identifiers.   I discussed the limitations, risks, security and privacy concerns of performing an evaluation and management service by telemedicine and the availability of in-person appointments. I also discussed with the patient that there may be a patient responsible charge related to this service. The patient expressed understanding and agreed to proceed.   Other persons participating in the visit and their role in the encounter: none   Patient's location: home  Provider's location: [facility] office       Symptom Management Consult note [facility]    Patient Care Team: Thana Ates, MD as PCP - General (Internal Medicine) Jodelle Red, MD as PCP - Cardiology (Cardiology) Donnelly Angelica, RN as Oncology Nurse Navigator Pershing Proud, RN as Oncology Nurse Navigator Griselda Miner, MD as Consulting Physician (General Surgery) Rachel Moulds, MD as Consulting Physician (Hematology and Oncology) Dorothy Puffer, MD as Consulting Physician (Radiation Oncology)    Name of the patient: Christine [name]  [id number]  1960-11-30   Date of visit: 12/12/2022    Chief complaint/ Reason for visit- follow up on mammogram results  Oncology History  Malignant neoplasm of upper-outer quadrant of left breast in female, estrogen receptor positive (HCC)  07/17/2021 Mammogram   Suspicious left breast mass at 3 o'clock, 4 cm from the nipple. No axillary adenopathy. Targeted ultrasound is performed, showing an irregular centrally hypoechoic mass with an echogenic rim measuring 7 x 5 by 7 mm, thought to correlate with the mammographically identified mass. This mass is located at 3 o'clock, 4 cm from the nipple. No axillary adenopathy.   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female,  estrogen receptor positive (HCC)   07/29/2021 Cancer Staging   Staging form: Breast, AJCC 8th Edition - Clinical stage from 07/29/2021: Stage IA (cT1c, cN0, cM0, G3, ER+, PR-, HER2+) - Signed by Ronny Bacon, PA-C on 07/31/2021 Stage prefix: Initial diagnosis Method of lymph node assessment: Clinical Histologic grading system: 3 grade system    Pathology Results   Path showed IDC, grade 3, ER 100% positive, strong staining intensity, PR negative, ki 67 35%, Her 2 positive   08/05/2021 Genetic Testing   Negative hereditary cancer genetic testing: no pathogenic variants detected in [employer] BRCAPlus Panel and [employer] CustomNext-Cancer +RNAinsight Panel.  Report dates are August 05, 2021 and August 08, 2021.    The BRCAplus panel offered by [employer] and includes sequencing and deletion/duplication analysis for the following 8 genes: ATM, BRCA1, BRCA2, CDH1, CHEK2, PALB2, PTEN, and TP53. The CustomNext-Cancer+RNAinsight panel offered by [employer] includes sequencing and rearrangement analysis for the following 47 genes:  APC, ATM, AXIN2, BARD1, BMPR1A, BRCA1, BRCA2, BRIP1, CDH1, CDK4, CDKN2A, CHEK2, DICER1, EPCAM, GREM1, HOXB13, MEN1, MLH1, MSH2, MSH3, MSH6, MUTYH, NBN, NF1, NF2, NTHL1, PALB2, PMS2, POLD1, POLE, PTEN, RAD51C, RAD51D, RECQL, RET, SDHA, SDHAF2, SDHB, SDHC, SDHD, SMAD4, SMARCA4, STK11, TP53, TSC1, TSC2, and VHL.  RNA data is routinely analyzed for use in variant interpretation for all genes.   08/09/2021 Surgery   Left lumpectomy: IDC, grade 3, 3 sentinel lymph nodes all negative for cancer.  He has biomarker testing ER positive, PR negative, HER2 positive.  pT1c, PN0.   09/12/2021 -  Adjuvant Chemotherapy   Taxol/Herceptin weekly x12, followed by Herceptin given every 3 weeks to complete 1  year of treatment.      Interval history- Christine [name] is a 62 yo female with oncologic history as documented above contact today via telephone to discuss results of recent mammogram  and ultrasound.  Patient denies any current symptoms.  No breast pain or skin changes.  She has no additional complaints at this time.     ROS  All other systems are reviewed and are negative for acute change except as noted in the HPI.    Allergies  Allergen Reactions   Lisinopril Cough    Night time cough   Mefloquine Other (See Comments)    Nightmares      Past Medical History:  Diagnosis Date   Coronary artery disease    Family history of breast cancer 07/31/2021   GERD (gastroesophageal reflux disease)    Hypertension    Personal history of radiation therapy 01/08/2022   ended 02-06-22     Past Surgical History:  Procedure Laterality Date   BREAST LUMPECTOMY Left 08/09/2021   BREAST LUMPECTOMY WITH RADIOACTIVE SEED AND SENTINEL LYMPH NODE BIOPSY Left 08/09/2021   Procedure: LEFT BREAST RADIOACTIVE SEED LOCALIZED LUMPECTOMY AND SENTINEL NODE BIOPSY;  Surgeon: Griselda Miner, MD;  Location: [facility] OR;  Service: General;  Laterality: Left;  GEN & PEC BLOCK   CESAREAN SECTION     FOOT SURGERY Right    PORT-A-CATH REMOVAL Right 09/19/2022   Procedure: REMOVAL PORT-A-CATH;  Surgeon: Griselda Miner, MD;  Location: [facility];  Service: General;  Laterality: Right;  45 MINS NEEDED IN ROOM 8   PORTACATH PLACEMENT Right 08/09/2021   Procedure: PORT PLACEMENT RIGHT  INTERNAL JUGULAR  WITH ULTRASOUND GUIDANCE;  Surgeon: Griselda Miner, MD;  Location: [facility] OR;  Service: General;  Laterality: Right;  RIGHT INTERNAL JUGULAR PLACEMENT    Social History   Socioeconomic History   Marital status: Married    Spouse name: Not on file   Number of children: Not on file   Years of education: Not on file   Highest education level: Not on file  Occupational History   Not on file  Tobacco Use   Smoking status: Never   Smokeless tobacco: Never  Vaping Use   Vaping status: Never Used  Substance and Sexual Activity   Alcohol use: Yes    Alcohol/week: 2.0 standard drinks of  alcohol    Types: 2 Glasses of wine per week   Drug use: Never   Sexual activity: Not on file  Other Topics Concern   Not on file  Social History Narrative   Not on file   Social Determinants of Health   Financial Resource Strain: Not on file  Food Insecurity: Not on file  Transportation Needs: Not on file  Physical Activity: Not on file  Stress: Not on file  Social Connections: Not on file  Intimate Partner Violence: Not on file    Family History  Problem Relation Age of Onset   Heart disease Father    Breast cancer Sister 71   Esophageal cancer Maternal Uncle        dx after age 72   Lung cancer Paternal Uncle    Skin cancer Paternal Uncle      Current Outpatient Medications:    acetaminophen (TYLENOL) 500 MG tablet, Take 1,000 mg by mouth every 6 (six) hours as needed for moderate pain., Disp: , Rfl:    aspirin EC 81 MG tablet, Take 81 mg by mouth daily. Swallow whole., Disp: ,  Rfl:    Cholecalciferol (DIALYVITE VITAMIN D 5000) 125 MCG (5000 UT) capsule, Take 15,000 Units by mouth every Monday., Disp: , Rfl:    MAGNESIUM PO, Take 150 mg by mouth 3 (three) times a week., Disp: , Rfl:    nitroGLYCERIN (NITROSTAT) 0.4 MG SL tablet, Place 1 tablet (0.4 mg total) under the tongue every 5 (five) minutes as needed for chest pain., Disp: 90 tablet, Rfl: 3   Omega-3 Fatty Acids (OMEGA 3 500) 500 MG CAPS, Take 500 mg by mouth daily., Disp: , Rfl:    rosuvastatin (CRESTOR) 40 MG tablet, TAKE 1 TABLET BY MOUTH EVERY DAY, Disp: 90 tablet, Rfl: 1   tamoxifen (NOLVADEX) 20 MG tablet, Take 1 tablet (20 mg total) by mouth daily., Disp: 90 tablet, Rfl: 3   valsartan-hydrochlorothiazide (DIOVAN-HCT) 80-12.5 MG tablet, TAKE 1 TABLET BY MOUTH EVERY DAY, Disp: 90 tablet, Rfl: 3   zolpidem (AMBIEN) 10 MG tablet, Take 5-10 mg by mouth at bedtime as needed for sleep., Disp: , Rfl:   PHYSICAL EXAM: ECOG FS:0 - Asymptomatic   There were no vitals filed for this visit.  Patient speaking in  clear sentences, no respiratory distress    LABORATORY DATA: I have reviewed the data as listed    Latest Ref Rng & Units 08/21/2022    8:33 AM 07/10/2022    8:09 AM 04/17/2022    8:50 AM  CBC  WBC 4.0 - 10.5 K/uL 5.7  6.6  4.6   Hemoglobin 12.0 - 15.0 g/dL 19.1  47.8  29.5   Hematocrit 36.0 - 46.0 % 34.5  33.5  31.9   Platelets 150 - 400 K/uL 227  220  193         Latest Ref Rng & Units 08/21/2022    8:33 AM 07/10/2022    8:09 AM 04/17/2022    8:50 AM  CMP  Glucose 70 - 99 mg/dL 80  621  308   BUN 8 - 23 mg/dL 16  22  18    Creatinine 0.44 - 1.00 mg/dL 6.57  8.46  9.62   Sodium 135 - 145 mmol/L 141  138  140   Potassium 3.5 - 5.1 mmol/L 4.0  4.0  3.7   Chloride 98 - 111 mmol/L 108  106  107   CO2 22 - 32 mmol/L 29  28  29    Calcium 8.9 - 10.3 mg/dL 8.6  9.1  8.6   Total Protein 6.5 - 8.1 g/dL 6.3  6.5  6.3   Total Bilirubin 0.3 - 1.2 mg/dL 0.7  0.9  0.8   Alkaline Phos 38 - 126 U/L 33  35  38   AST 15 - 41 U/L 21  25  24    ALT 0 - 44 U/L 21  28  20         RADIOGRAPHIC STUDIES: I have personally reviewed the radiological images as listed and agreed with the findings in the report. No images are attached to the encounter. MM DIAG BREAST TOMO UNI RIGHT  Result Date: 12/10/2022 CLINICAL DATA:  Approximately 3 weeks ago, patient noted ropey fullness along the inframammary fold region of the RIGHT breast. EXAM: DIGITAL DIAGNOSTIC UNILATERAL RIGHT MAMMOGRAM WITH TOMOSYNTHESIS AND CAD; ULTRASOUND RIGHT BREAST LIMITED TECHNIQUE: Right digital diagnostic mammography and breast tomosynthesis was performed. The images were evaluated with computer-aided detection. ; Targeted ultrasound examination of the right breast was performed COMPARISON:  Previous exam(s). ACR Breast Density Category b: There are scattered areas of fibroglandular  density. FINDINGS: No suspicious mass, distortion, or microcalcifications are identified to suggest presence of malignancy. Specifically, in the LOWER  portions of the RIGHT breast, normal appearing fibrofatty tissue is imaged. On physical exam, I palpate soft areas of thickening along the inframammary fold of the RIGHT breast, particularly in the LATERAL portion of the RIGHT breast. I palpate no discrete mass. Targeted ultrasound is performed, showing appearing fibroglandular tissue throughout the LOWER portion of the RIGHT breast, at the inframammary fold location. No suspicious mass, distortion, or acoustic shadowing is demonstrated with ultrasound. IMPRESSION: No mammographic or ultrasound evidence for malignancy. RECOMMENDATION: Screening mammogram is recommended in June 2025. I have discussed the findings and recommendations with the patient. If applicable, a reminder letter will be sent to the patient regarding the next appointment. BI-RADS CATEGORY  1: Negative. Electronically Signed   By: Norva Pavlov M.D.   On: 12/10/2022 15:05   [place] LIMITED ULTRASOUND INCLUDING AXILLA RIGHT BREAST  Result Date: 12/10/2022 CLINICAL DATA:  Approximately 3 weeks ago, patient noted ropey fullness along the inframammary fold region of the RIGHT breast. EXAM: DIGITAL DIAGNOSTIC UNILATERAL RIGHT MAMMOGRAM WITH TOMOSYNTHESIS AND CAD; ULTRASOUND RIGHT BREAST LIMITED TECHNIQUE: Right digital diagnostic mammography and breast tomosynthesis was performed. The images were evaluated with computer-aided detection. ; Targeted ultrasound examination of the right breast was performed COMPARISON:  Previous exam(s). ACR Breast Density Category b: There are scattered areas of fibroglandular density. FINDINGS: No suspicious mass, distortion, or microcalcifications are identified to suggest presence of malignancy. Specifically, in the LOWER portions of the RIGHT breast, normal appearing fibrofatty tissue is imaged. On physical exam, I palpate soft areas of thickening along the inframammary fold of the RIGHT breast, particularly in the LATERAL portion of the RIGHT breast. I palpate no  discrete mass. Targeted ultrasound is performed, showing appearing fibroglandular tissue throughout the LOWER portion of the RIGHT breast, at the inframammary fold location. No suspicious mass, distortion, or acoustic shadowing is demonstrated with ultrasound. IMPRESSION: No mammographic or ultrasound evidence for malignancy. RECOMMENDATION: Screening mammogram is recommended in June 2025. I have discussed the findings and recommendations with the patient. If applicable, a reminder letter will be sent to the patient regarding the next appointment. BI-RADS CATEGORY  1: Negative. Electronically Signed   By: Norva Pavlov M.D.   On: 12/10/2022 15:05     ASSESSMENT & PLAN: Patient is a 62 y.o. female with oncologic history of malignant neoplasm of upper outer quadrant of left breast, ER positive followed by Dr. Al Pimple.    #Concern for right breast mass -Mammogram without evidence of malignancy. Patient due for next screening mammogram in June 2025   Visit Diagnosis: 1. Malignant neoplasm of upper-outer quadrant of left breast in female, estrogen receptor positive (HCC)      No orders of the defined types were placed in this encounter.   All questions were answered. The patient knows to call the clinic with any problems, questions or concerns. No barriers to learning was detected.  Time spent with patient on telephone encounter: 10 minutes   Thank you for allowing me to participate in the care of this patient.    Shanon Ace, PA-C Department of Hematology/Oncology [facility] at [facility] Phone: [phone]  [phone]    12/12/2022 2:11 PM

## 2023-01-29 ENCOUNTER — Other Ambulatory Visit: Payer: Self-pay | Admitting: *Deleted

## 2023-01-30 ENCOUNTER — Encounter: Payer: Self-pay | Admitting: Hematology and Oncology

## 2023-01-30 ENCOUNTER — Other Ambulatory Visit: Payer: Self-pay | Admitting: *Deleted

## 2023-01-30 MED ORDER — TAMOXIFEN CITRATE 20 MG PO TABS: 20.0000 mg | ORAL_TABLET | Freq: Every day | ORAL | 3 refills | Status: DC

## 2023-02-03 ENCOUNTER — Other Ambulatory Visit (HOSPITAL_BASED_OUTPATIENT_CLINIC_OR_DEPARTMENT_OTHER): Payer: Self-pay | Admitting: Cardiology

## 2023-02-03 DIAGNOSIS — I251 Atherosclerotic heart disease 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: Secondary | ICD-10-CM

## 2023-02-23 ENCOUNTER — Ambulatory Visit: Payer: No Typology Code available for payment source | Admitting: Hematology and Oncology

## 2023-02-24 ENCOUNTER — Encounter: Payer: Self-pay | Admitting: Hematology and Oncology

## 2023-02-24 ENCOUNTER — Inpatient Hospital Stay
Payer: No Typology Code available for payment source | Attending: Hematology and Oncology | Admitting: Hematology and Oncology

## 2023-02-24 VITALS — BP 126/55 | HR 76 | Temp 97.8°F | Resp 17 | Wt 134.4 lb

## 2023-02-24 DIAGNOSIS — Z923 Personal history of irradiation: Secondary | ICD-10-CM | POA: Diagnosis not present

## 2023-02-24 DIAGNOSIS — M810 Age-related osteoporosis without current pathological fracture: Secondary | ICD-10-CM | POA: Insufficient documentation

## 2023-02-24 DIAGNOSIS — Z808 Family history of malignant neoplasm of other organs or systems: Secondary | ICD-10-CM | POA: Insufficient documentation

## 2023-02-24 DIAGNOSIS — Z170 Estrogen receptor positive status [ER+]: Secondary | ICD-10-CM | POA: Insufficient documentation

## 2023-02-24 DIAGNOSIS — Z79810 Long term (current) use of selective estrogen receptor modulators (SERMs): Secondary | ICD-10-CM | POA: Insufficient documentation

## 2023-02-24 DIAGNOSIS — Z803 Family history of malignant neoplasm of breast: Secondary | ICD-10-CM | POA: Insufficient documentation

## 2023-02-24 DIAGNOSIS — G629 Polyneuropathy, unspecified: Secondary | ICD-10-CM | POA: Insufficient documentation

## 2023-02-24 DIAGNOSIS — Z1731 Human epidermal growth factor receptor 2 positive status: Secondary | ICD-10-CM | POA: Insufficient documentation

## 2023-02-24 DIAGNOSIS — C50412 Malignant neoplasm of upper-outer quadrant of left female breast: Secondary | ICD-10-CM | POA: Diagnosis not present

## 2023-02-24 DIAGNOSIS — Z9221 Personal history of antineoplastic chemotherapy: Secondary | ICD-10-CM | POA: Diagnosis not present

## 2023-02-24 DIAGNOSIS — Z800 Family history of malignant neoplasm of digestive organs: Secondary | ICD-10-CM | POA: Insufficient documentation

## 2023-02-24 DIAGNOSIS — Z1722 Progesterone receptor negative status: Secondary | ICD-10-CM | POA: Diagnosis not present

## 2023-02-24 DIAGNOSIS — Z801 Family history of malignant neoplasm of trachea, bronchus and lung: Secondary | ICD-10-CM | POA: Diagnosis not present

## 2023-02-24 NOTE — Assessment & Plan Note (Signed)
Christine [name] is a 63 year old woman with stage Ia ER positive, PR negative, Her 2 amplified breast cancer status postlumpectomy, adj chemo and herceptin maintenance and now on adj tamoxifen.  Breast Cancer Completed Herceptin on August 21, 2022. Currently on Tamoxifen with no reported side effects. No palpable abnormalities on physical examination. -Continue Tamoxifen. -Consider ctDNA testing for early detection of recurrence. Provided patient with information to review and decide.  Osteoporosis Currently on generic Fosamax. FU with endo as scheduled.  Peripheral Neuropathy Persistent symptoms in feet, improved in fingers. Not painful, but noticeable. -Consider trial of PEA supplement OTC  General Health Maintenance -Continue regular screenings and follow-ups as scheduled.  Rachel Moulds MD

## 2023-02-24 NOTE — Progress Notes (Signed)
[facility] Cancer Follow up:    Christine Ates, MD [address] [place] [address]   DIAGNOSIS:  Cancer Staging  Malignant neoplasm of upper-outer quadrant of left breast in female, estrogen receptor positive (HCC) Staging form: Breast, AJCC 8th Edition - Clinical stage from 07/29/2021: Stage IA (cT1c, cN0, cM0, G3, ER+, PR-, HER2+) - Signed by Ronny Bacon, PA-C on 07/31/2021 Stage prefix: Initial diagnosis Method of lymph node assessment: Clinical Histologic grading system: 3 grade system   SUMMARY OF ONCOLOGIC HISTORY: Oncology History  Malignant neoplasm of upper-outer quadrant of left breast in female, estrogen receptor positive (HCC)  07/17/2021 Mammogram   Suspicious left breast mass at 3 o'clock, 4 cm from the nipple. No axillary adenopathy. Targeted ultrasound is performed, showing an irregular centrally hypoechoic mass with an echogenic rim measuring 7 x 5 by 7 mm, thought to correlate with the mammographically identified mass. This mass is located at 3 o'clock, 4 cm from the nipple. No axillary adenopathy.   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female, estrogen receptor positive (HCC)   07/29/2021 Cancer Staging   Staging form: Breast, AJCC 8th Edition - Clinical stage from 07/29/2021: Stage IA (cT1c, cN0, cM0, G3, ER+, PR-, HER2+) - Signed by Ronny Bacon, PA-C on 07/31/2021 Stage prefix: Initial diagnosis Method of lymph node assessment: Clinical Histologic grading system: 3 grade system    Pathology Results   Path showed IDC, grade 3, ER 100% positive, strong staining intensity, PR negative, ki 67 35%, Her 2 positive   08/05/2021 Genetic Testing   Negative hereditary cancer genetic testing: no pathogenic variants detected in [facility] BRCAPlus Panel and [facility] CustomNext-Cancer +RNAinsight Panel.  Report dates are August 05, 2021 and August 08, 2021.    The BRCAplus panel  offered by [employer] and includes sequencing and deletion/duplication analysis for the following 8 genes: ATM, BRCA1, BRCA2, CDH1, CHEK2, PALB2, PTEN, and TP53. The CustomNext-Cancer+RNAinsight panel offered by [employer] includes sequencing and rearrangement analysis for the following 47 genes:  APC, ATM, AXIN2, BARD1, BMPR1A, BRCA1, BRCA2, BRIP1, CDH1, CDK4, CDKN2A, CHEK2, DICER1, EPCAM, GREM1, HOXB13, MEN1, MLH1, MSH2, MSH3, MSH6, MUTYH, NBN, NF1, NF2, NTHL1, PALB2, PMS2, POLD1, POLE, PTEN, RAD51C, RAD51D, RECQL, RET, SDHA, SDHAF2, SDHB, SDHC, SDHD, SMAD4, SMARCA4, STK11, TP53, TSC1, TSC2, and VHL.  RNA data is routinely analyzed for use in variant interpretation for all genes.   08/09/2021 Surgery   Left lumpectomy: IDC, grade 3, 3 sentinel lymph nodes all negative for cancer.  He has biomarker testing ER positive, PR negative, HER2 positive.  pT1c, PN0.   09/12/2021 -  Adjuvant Chemotherapy   Taxol/Herceptin weekly x12, followed by Herceptin given every 3 weeks to complete 1 year of treatment.     CURRENT THERAPY: Herceptin; Tamoxifen daily  INTERVAL HISTORY:  Christine [name] 63 y.o. female returns for follow up.   The patient, with a history of breast cancer and osteoporosis, reports no noticeable side effects from current medication, tamoxifen. The patient finished Herceptin treatment in July 2024 and had a port removed in August. The patient is also on Fosamax for osteoporosis and is due for a six-month follow-up soon.  The patient reports neuropathy in the feet and fingers. The neuropathy in the fingers has improved, allowing the patient to perform tasks such as removing contacts with more ease. However, the neuropathy in the feet remains the same. It is not painful but is  a constant presence.  The patient recently had an ultrasound and mammogram due to a ropey feeling in the breast after scratching the area. Both tests came back fine.  Rest of the pertinent 10 point ROS  reviewed and neg  Patient Active Problem List   Diagnosis Date Noted   Antineoplastic chemotherapy induced anemia 10/18/2021   Port-A-Cath in place 09/12/2021   Iron deficiency anemia 09/02/2021   Genetic testing 08/05/2021   Family history of breast cancer 07/31/2021   Atherosclerotic heart disease 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 07/31/2021   Easy bruising 07/31/2021   Essential hypertension 07/31/2021   Hearing loss in left ear 07/31/2021   Insomnia 07/31/2021   Mixed hyperlipidemia 07/31/2021   Other long term (current) drug therapy 07/31/2021   Sleep disorder 07/31/2021   Tendency toward bleeding easily (HCC) 07/31/2021   Vitamin D deficiency 07/31/2021   Malignant neoplasm of upper-outer quadrant of left breast in female, estrogen receptor positive (HCC) 07/25/2021   Abnormal finding on mammography 07/10/2021   GERD (gastroesophageal reflux disease) 08/31/2019   Globus pharyngeus 08/02/2019   Rhinitis, chronic 08/02/2019   Elevated blood pressure reading 01/26/2019   Acute pain of right knee 10/26/2017    is allergic to lisinopril and mefloquine.  MEDICAL HISTORY: Past Medical History:  Diagnosis Date   Coronary artery disease    Family history of breast cancer 07/31/2021   GERD (gastroesophageal reflux disease)    Hypertension    Personal history of radiation therapy 01/08/2022   ended 02-06-22    SURGICAL HISTORY: Past Surgical History:  Procedure Laterality Date   BREAST LUMPECTOMY Left 08/09/2021   BREAST LUMPECTOMY WITH RADIOACTIVE SEED AND SENTINEL LYMPH NODE BIOPSY Left 08/09/2021   Procedure: LEFT BREAST RADIOACTIVE SEED LOCALIZED LUMPECTOMY AND SENTINEL NODE BIOPSY;  Surgeon: Griselda Miner, MD;  Location: [facility];  Service: General;  Laterality: Left;  GEN & PEC BLOCK   CESAREAN SECTION     FOOT SURGERY Right    PORT-A-CATH REMOVAL Right 09/19/2022   Procedure: REMOVAL PORT-A-CATH;  Surgeon: Griselda Miner, MD;  Location: [facility];  Service: General;  Laterality: Right;  45 MINS NEEDED IN ROOM 8   PORTACATH PLACEMENT Right 08/09/2021   Procedure: PORT PLACEMENT RIGHT  INTERNAL JUGULAR  WITH ULTRASOUND GUIDANCE;  Surgeon: Griselda Miner, MD;  Location: [facility];  Service: General;  Laterality: Right;  RIGHT INTERNAL JUGULAR PLACEMENT    SOCIAL HISTORY: Social History   Socioeconomic History   Marital status: Married    Spouse name: Not on file   Number of children: Not on file   Years of education: Not on file   Highest education level: Not on file  Occupational History   Not on file  Tobacco Use   Smoking status: Never   Smokeless tobacco: Never  Vaping Use   Vaping status: Never Used  Substance and Sexual Activity   Alcohol use: Yes    Alcohol/week: 2.0 standard drinks of alcohol    Types: 2 Glasses of wine per week   Drug use: Never   Sexual activity: Not on file  Other Topics Concern   Not on file  Social History Narrative   Not on file   Social Drivers of Health   Financial Resource Strain: Not on file  Food Insecurity: Not on file  Transportation Needs: Not on file  Physical Activity: Not on file  Stress: Not on file  Social Connections: Not on file  Intimate Partner  Violence: Not on file    FAMILY HISTORY: Family History  Problem Relation Age of Onset   Heart disease Father    Breast cancer Sister 60   Esophageal cancer Maternal Uncle        dx after age 62   Lung cancer Paternal Uncle    Skin cancer Paternal Uncle     Review of Systems  Constitutional:  Negative for appetite change, chills, fatigue, fever and unexpected weight change.  HENT:   Negative for hearing loss, lump/mass and trouble swallowing.   Eyes:  Negative for eye problems and icterus.  Respiratory:  Negative for chest tightness, cough and shortness of breath.   Cardiovascular:  Negative for chest pain, leg swelling and palpitations.  Gastrointestinal:  Negative for abdominal distention, abdominal  pain, constipation, diarrhea, nausea and vomiting.  Endocrine: Negative for hot flashes.  Genitourinary:  Negative for difficulty urinating.   Musculoskeletal:  Negative for arthralgias.  Skin:  Negative for itching and rash.  Neurological:  Negative for dizziness, extremity weakness, headaches and numbness.  Hematological:  Negative for adenopathy. Does not bruise/bleed easily.  Psychiatric/Behavioral:  Negative for depression. The patient is not nervous/anxious.       PHYSICAL EXAMINATION  ECOG PERFORMANCE STATUS: 1 - Symptomatic but completely ambulatory  Vitals:   02/24/23 1132  BP: (!) 126/55  Pulse: 76  Resp: 17  Temp: 97.8 F (36.6 C)  SpO2: 100%   General : Alert, oriented and in no acute distess Both breasts inspected, no palpable masses or regional adenopathy No LE edema.    LABORATORY DATA:  CBC    Component Value Date/Time   WBC 5.7 08/21/2022 0833   WBC 6.8 06/16/2019 1456   RBC 3.79 (L) 08/21/2022 0833   HGB 11.6 (L) 08/21/2022 0833   HCT 34.5 (L) 08/21/2022 0833   PLT 227 08/21/2022 0833   MCV 91.0 08/21/2022 0833   MCH 30.6 08/21/2022 0833   MCHC 33.6 08/21/2022 0833   RDW 12.4 08/21/2022 0833   LYMPHSABS 1.4 08/21/2022 0833   MONOABS 0.5 08/21/2022 0833   EOSABS 0.3 08/21/2022 0833   BASOSABS 0.1 08/21/2022 0833    CMP     Component Value Date/Time   NA 141 08/21/2022 0833   NA 142 10/31/2019 1545   K 4.0 08/21/2022 0833   CL 108 08/21/2022 0833   CO2 29 08/21/2022 0833   GLUCOSE 80 08/21/2022 0833   BUN 16 08/21/2022 0833   BUN 11 10/31/2019 1545   CREATININE 0.74 08/21/2022 0833   CALCIUM 8.6 (L) 08/21/2022 0833   PROT 6.3 (L) 08/21/2022 0833   PROT 6.7 08/01/2020 1004   ALBUMIN 3.9 08/21/2022 0833   ALBUMIN 4.5 08/01/2020 1004   AST 21 08/21/2022 0833   ALT 21 08/21/2022 0833   ALKPHOS 33 (L) 08/21/2022 0833   BILITOT 0.7 08/21/2022 0833   GFRNONAA >60 08/21/2022 0833   GFRAA 104 10/31/2019 1545      ASSESSMENT and  THERAPY PLAN:   Malignant neoplasm of upper-outer quadrant of left breast in female, estrogen receptor positive (HCC) Christine [name] is a 62 year old woman with stage Ia ER positive, PR negative, Her 2 amplified breast cancer status postlumpectomy, adj chemo and herceptin maintenance and now on adj tamoxifen.  Breast Cancer Completed Herceptin on August 21, 2022. Currently on Tamoxifen with no reported side effects. No palpable abnormalities on physical examination. -Continue Tamoxifen. -Consider ctDNA testing for early detection of recurrence. Provided patient with information to review and decide.  Osteoporosis Currently  on generic Fosamax. FU with endo as scheduled.  Peripheral Neuropathy Persistent symptoms in feet, improved in fingers. Not painful, but noticeable. -Consider trial of PEA supplement OTC  General Health Maintenance -Continue regular screenings and follow-ups as scheduled.  Rachel Moulds MD   All questions were answered. The patient knows to call the clinic with any problems, questions or concerns. We can certainly see the patient much sooner if necessary. This note was electronically signed.  Total encounter time:30 minutes*in face-to-face visit time, chart review, lab review, care coordination, order entry, and documentation of the encounter time.  *Total Encounter Time as defined by the Centers for Medicare and Medicaid Services includes, in addition to the face-to-face time of a patient visit (documented in the note above) non-face-to-face time: obtaining and reviewing outside history, ordering and reviewing medications, tests or procedures, care coordination (communications with other health care professionals or caregivers) and documentation in the medical record.

## 2023-02-25 ENCOUNTER — Telehealth: Payer: Self-pay | Admitting: Hematology and Oncology

## 2023-02-25 NOTE — Telephone Encounter (Signed)
 Left patient a vm regarding upcoming appointment

## 2023-02-26 ENCOUNTER — Other Ambulatory Visit: Payer: Self-pay

## 2023-03-02 ENCOUNTER — Ambulatory Visit: Payer: No Typology Code available for payment source | Attending: General Surgery

## 2023-03-02 VITALS — Wt 136.2 lb

## 2023-03-02 DIAGNOSIS — Z483 Aftercare following surgery for neoplasm: Secondary | ICD-10-CM

## 2023-03-02 NOTE — Therapy (Signed)
OUTPATIENT PHYSICAL THERAPY [facility] SCREENING NOTE   Patient Name: Christine [name] MRN: [id number] DOB:02-26-1960, 63 y.o., female Today's Date: 03/02/2023  PCP: Thana Ates, MD REFERRING PROVIDER: Griselda Miner, MD   PT End of Session - 03/02/23 [phone]     Visit Number 3   # unchanged due to screen only   PT Start Time 0927    PT Stop Time 0930    PT Time Calculation (min) 3 min    Activity Tolerance Patient tolerated treatment well    Behavior During Therapy [facility] for tasks assessed/performed             Past Medical History:  Diagnosis Date   Coronary artery disease    Family history of breast cancer 07/31/2021   GERD (gastroesophageal reflux disease)    Hypertension    Personal history of radiation therapy 01/08/2022   ended 02-06-22   Past Surgical History:  Procedure Laterality Date   BREAST LUMPECTOMY Left 08/09/2021   BREAST LUMPECTOMY WITH RADIOACTIVE SEED AND SENTINEL LYMPH NODE BIOPSY Left 08/09/2021   Procedure: LEFT BREAST RADIOACTIVE SEED LOCALIZED LUMPECTOMY AND SENTINEL NODE BIOPSY;  Surgeon: Griselda Miner, MD;  Location: [facility];  Service: General;  Laterality: Left;  GEN & PEC BLOCK   CESAREAN SECTION     FOOT SURGERY Right    PORT-A-CATH REMOVAL Right 09/19/2022   Procedure: REMOVAL PORT-A-CATH;  Surgeon: Griselda Miner, MD;  Location: [facility];  Service: General;  Laterality: Right;  45 MINS NEEDED IN ROOM 8   PORTACATH PLACEMENT Right 08/09/2021   Procedure: PORT PLACEMENT RIGHT  INTERNAL JUGULAR  WITH ULTRASOUND GUIDANCE;  Surgeon: Griselda Miner, MD;  Location: [facility];  Service: General;  Laterality: Right;  RIGHT INTERNAL JUGULAR PLACEMENT   Patient Active Problem List   Diagnosis Date Noted   Antineoplastic chemotherapy induced anemia 10/18/2021   Port-A-Cath in place 09/12/2021   Iron deficiency anemia 09/02/2021   Genetic testing 08/05/2021   Family history of breast cancer 07/31/2021   Atherosclerotic heart disease of native  coronary artery without angina pectoris 07/31/2021   Easy bruising 07/31/2021   Essential hypertension 07/31/2021   Hearing loss in left ear 07/31/2021   Insomnia 07/31/2021   Mixed hyperlipidemia 07/31/2021   Other long term (current) drug therapy 07/31/2021   Sleep disorder 07/31/2021   Tendency toward bleeding easily (HCC) 07/31/2021   Vitamin D deficiency 07/31/2021   Malignant neoplasm of upper-outer quadrant of left breast in female, estrogen receptor positive (HCC) 07/25/2021   Abnormal finding on mammography 07/10/2021   GERD (gastroesophageal reflux disease) 08/31/2019   Globus pharyngeus 08/02/2019   Rhinitis, chronic 08/02/2019   Elevated blood pressure reading 01/26/2019   Acute pain of right knee 10/26/2017    REFERRING DIAG: left breast cancer at risk for lymphedema  THERAPY DIAG: Aftercare following surgery for neoplasm  PERTINENT HISTORY: Patient was diagnosed on 07/08/2021 with left grade 3 invasive ductal carcinoma breast cancer. It measures 7 mm and is located in the upper outer quadrant. It is ER positive, PR negative, and HER2 positive with a Ki67 of 35%. Left lumpectomy and SLNB on 08/09/21 with port placement and 3 negative lymph nodes. Underwent chemotherapy with taxol and herceptin.   PRECAUTIONS: left UE Lymphedema risk  SUBJECTIVE: Patient returns for her 3 month L-Dex screen.   PAIN:  Are you having pain? No  LYMPHEDEMA ASSESSMENTS:    Patient was assessed today using the SOZO machine to determine the lymphedema  index score. This was compared to her baseline score. It was determined that she is within the recommended range when compared to her baseline and no further action is needed at this time. She will continue SOZO screenings. These are done every 3 months for 2 years post operatively followed by every 6 months for 2 years, and then annually.        L-DEX FLOWSHEETS - 03/02/23 0900       L-DEX LYMPHEDEMA SCREENING   Measurement Type Unilateral     L-DEX MEASUREMENT EXTREMITY Upper Extremity    POSITION  Standing    DOMINANT SIDE Right    At Risk Side Left    BASELINE SCORE (UNILATERAL) -2.1    L-DEX SCORE (UNILATERAL) -2.3    VALUE CHANGE (UNILAT) -0.2              P: May start 6 month L-Dex screens next.   Berna Spare, PTA 03/02/23 9:29 AM

## 2023-04-20 LAB — LIPOPROTEIN A (LPA): Lipoprotein (a): 96.2 nmol/L — ABNORMAL HIGH (ref <75.0)

## 2023-06-01 ENCOUNTER — Ambulatory Visit: Payer: No Typology Code available for payment source | Attending: General Surgery

## 2023-06-01 VITALS — Wt 137.4 lb

## 2023-06-01 DIAGNOSIS — Z483 Aftercare following surgery for neoplasm: Secondary | ICD-10-CM | POA: Insufficient documentation

## 2023-06-01 NOTE — Therapy (Signed)
 OUTPATIENT PHYSICAL THERAPY [facility] SCREENING NOTE   Patient Name: Christine [name] MRN: [id number] DOB:1960-05-26, 63 y.o., female Today's Date: 06/01/2023  PCP: Tena Feeling, MD REFERRING PROVIDER: Caralyn Chandler, MD   PT End of Session - 06/01/23 0809     Visit Number 3   # unchanged due to screen only   PT Start Time 0808    PT Stop Time 0812    PT Time Calculation (min) 4 min    Activity Tolerance Patient tolerated treatment well    Behavior During Therapy [facility] for tasks assessed/performed             Past Medical History:  Diagnosis Date   Coronary artery disease    Family history of breast cancer 07/31/2021   GERD (gastroesophageal reflux disease)    Hypertension    Personal history of radiation therapy 01/08/2022   ended 02-06-22   Past Surgical History:  Procedure Laterality Date   BREAST LUMPECTOMY Left 08/09/2021   BREAST LUMPECTOMY WITH RADIOACTIVE SEED AND SENTINEL LYMPH NODE BIOPSY Left 08/09/2021   Procedure: LEFT BREAST RADIOACTIVE SEED LOCALIZED LUMPECTOMY AND SENTINEL NODE BIOPSY;  Surgeon: Caralyn Chandler, MD;  Location: [facility];  Service: General;  Laterality: Left;  GEN & PEC BLOCK   CESAREAN SECTION     FOOT SURGERY Right    PORT-A-CATH REMOVAL Right 09/19/2022   Procedure: REMOVAL PORT-A-CATH;  Surgeon: Caralyn Chandler, MD;  Location: [facility];  Service: General;  Laterality: Right;  45 MINS NEEDED IN ROOM 8   PORTACATH PLACEMENT Right 08/09/2021   Procedure: PORT PLACEMENT RIGHT  INTERNAL JUGULAR  WITH ULTRASOUND GUIDANCE;  Surgeon: Caralyn Chandler, MD;  Location: [facility];  Service: General;  Laterality: Right;  RIGHT INTERNAL JUGULAR PLACEMENT   Patient Active Problem List   Diagnosis Date Noted   Antineoplastic chemotherapy induced anemia 10/18/2021   Port-A-Cath in place 09/12/2021   Iron deficiency anemia 09/02/2021   Genetic testing 08/05/2021   Family history of breast cancer 07/31/2021   Atherosclerotic heart disease of native  coronary artery without angina pectoris 07/31/2021   Easy bruising 07/31/2021   Essential hypertension 07/31/2021   Hearing loss in left ear 07/31/2021   Insomnia 07/31/2021   Mixed hyperlipidemia 07/31/2021   Other long term (current) drug therapy 07/31/2021   Sleep disorder 07/31/2021   Tendency toward bleeding easily (HCC) 07/31/2021   Vitamin D deficiency 07/31/2021   Malignant neoplasm of upper-outer quadrant of left breast in female, estrogen receptor positive (HCC) 07/25/2021   Abnormal finding on mammography 07/10/2021   GERD (gastroesophageal reflux disease) 08/31/2019   Globus pharyngeus 08/02/2019   Rhinitis, chronic 08/02/2019   Elevated blood pressure reading 01/26/2019   Acute pain of right knee 10/26/2017    REFERRING DIAG: left breast cancer at risk for lymphedema  THERAPY DIAG: Aftercare following surgery for neoplasm  PERTINENT HISTORY: Patient was diagnosed on 07/08/2021 with left grade 3 invasive ductal carcinoma breast cancer. It measures 7 mm and is located in the upper outer quadrant. It is ER positive, PR negative, and HER2 positive with a Ki67 of 35%. Left lumpectomy and SLNB on 08/09/21 with port placement and 3 negative lymph nodes. Underwent chemotherapy with taxol  and herceptin .   PRECAUTIONS: left UE Lymphedema risk  SUBJECTIVE: Patient returns for her 3 month L-Dex screen.   PAIN:  Are you having pain? No  LYMPHEDEMA ASSESSMENTS:    Patient was assessed today using the SOZO machine to determine the lymphedema  index score. This was compared to her baseline score. It was determined that she is within the recommended range when compared to her baseline and no further action is needed at this time. She will continue SOZO screenings. These are done every 3 months for 2 years post operatively followed by every 6 months for 2 years, and then annually.        L-DEX FLOWSHEETS - 06/01/23 0800       L-DEX LYMPHEDEMA SCREENING   Measurement Type Unilateral     L-DEX MEASUREMENT EXTREMITY Upper Extremity    POSITION  Standing    DOMINANT SIDE Right    At Risk Side Left    BASELINE SCORE (UNILATERAL) -2.1    L-DEX SCORE (UNILATERAL) -1.1    VALUE CHANGE (UNILAT) 1              P: Start 6 month L-Dex screens next.   Roslynn Coombes, PTA 06/01/23 8:11 AM

## 2023-06-03 ENCOUNTER — Other Ambulatory Visit: Payer: Self-pay

## 2023-06-11 ENCOUNTER — Encounter (HOSPITAL_BASED_OUTPATIENT_CLINIC_OR_DEPARTMENT_OTHER): Payer: Self-pay

## 2023-06-11 ENCOUNTER — Other Ambulatory Visit: Payer: Self-pay | Admitting: Internal Medicine

## 2023-06-11 DIAGNOSIS — Z853 Personal history of malignant neoplasm of breast: Secondary | ICD-10-CM

## 2023-07-13 ENCOUNTER — Ambulatory Visit
Admission: RE | Admit: 2023-07-13 | Discharge: 2023-07-13 | Disposition: A | Discharge status: Home / Self Care | Source: Ambulatory Visit | Attending: Internal Medicine | Admitting: Internal Medicine

## 2023-07-13 DIAGNOSIS — Z853 Personal history of malignant neoplasm of breast: Secondary | ICD-10-CM

## 2023-07-13 HISTORY — DX: Personal history of antineoplastic chemotherapy: Z92.21

## 2023-08-24 ENCOUNTER — Telehealth: Payer: Self-pay

## 2023-08-24 NOTE — Telephone Encounter (Signed)
 Patient was unreachable by phone.

## 2023-08-25 ENCOUNTER — Telehealth: Payer: Self-pay

## 2023-08-25 ENCOUNTER — Inpatient Hospital Stay

## 2023-08-25 ENCOUNTER — Inpatient Hospital Stay
Payer: No Typology Code available for payment source | Attending: Hematology and Oncology | Admitting: Hematology and Oncology

## 2023-08-25 VITALS — BP 123/64 | HR 73 | Temp 98.4°F | Resp 16 | Wt 137.3 lb

## 2023-08-25 DIAGNOSIS — Z808 Family history of malignant neoplasm of other organs or systems: Secondary | ICD-10-CM | POA: Insufficient documentation

## 2023-08-25 DIAGNOSIS — C50412 Malignant neoplasm of upper-outer quadrant of left female breast: Secondary | ICD-10-CM | POA: Insufficient documentation

## 2023-08-25 DIAGNOSIS — Z801 Family history of malignant neoplasm of trachea, bronchus and lung: Secondary | ICD-10-CM | POA: Diagnosis not present

## 2023-08-25 DIAGNOSIS — Z79810 Long term (current) use of selective estrogen receptor modulators (SERMs): Secondary | ICD-10-CM | POA: Diagnosis not present

## 2023-08-25 DIAGNOSIS — Z800 Family history of malignant neoplasm of digestive organs: Secondary | ICD-10-CM | POA: Diagnosis not present

## 2023-08-25 DIAGNOSIS — Z170 Estrogen receptor positive status [ER+]: Secondary | ICD-10-CM | POA: Diagnosis not present

## 2023-08-25 DIAGNOSIS — Z923 Personal history of irradiation: Secondary | ICD-10-CM | POA: Diagnosis not present

## 2023-08-25 DIAGNOSIS — Z9221 Personal history of antineoplastic chemotherapy: Secondary | ICD-10-CM | POA: Insufficient documentation

## 2023-08-25 DIAGNOSIS — Z1722 Progesterone receptor negative status: Secondary | ICD-10-CM | POA: Insufficient documentation

## 2023-08-25 DIAGNOSIS — Z1731 Human epidermal growth factor receptor 2 positive status: Secondary | ICD-10-CM | POA: Insufficient documentation

## 2023-08-25 DIAGNOSIS — Z803 Family history of malignant neoplasm of breast: Secondary | ICD-10-CM | POA: Diagnosis not present

## 2023-08-25 DIAGNOSIS — M2550 Pain in unspecified joint: Secondary | ICD-10-CM | POA: Insufficient documentation

## 2023-08-25 LAB — CBC WITH DIFFERENTIAL/PLATELET
Abs Immature Granulocytes: 0.03 K/uL (ref 0.00–0.07)
Basophils Absolute: 0 K/uL (ref 0.0–0.1)
Basophils Relative: 0 %
Eosinophils Absolute: 0 K/uL (ref 0.0–0.5)
Eosinophils Relative: 0 %
HCT: 38 % (ref 36.0–46.0)
Hemoglobin: 12.7 g/dL (ref 12.0–15.0)
Immature Granulocytes: 0 %
Lymphocytes Relative: 15 %
Lymphs Abs: 1.4 K/uL (ref 0.7–4.0)
MCH: 30.2 pg (ref 26.0–34.0)
MCHC: 33.4 g/dL (ref 30.0–36.0)
MCV: 90.5 fL (ref 80.0–100.0)
Monocytes Absolute: 0.4 K/uL (ref 0.1–1.0)
Monocytes Relative: 5 %
Neutro Abs: 7 K/uL (ref 1.7–7.7)
Neutrophils Relative %: 80 %
Platelets: 231 K/uL (ref 150–400)
RBC: 4.2 MIL/uL (ref 3.87–5.11)
RDW: 12.4 % (ref 11.5–15.5)
WBC: 8.9 K/uL (ref 4.0–10.5)
nRBC: 0 % (ref 0.0–0.2)

## 2023-08-25 LAB — CMP (CANCER CENTER ONLY)
ALT: 18 U/L (ref 0–44)
AST: 21 U/L (ref 15–41)
Albumin: 4.3 g/dL (ref 3.5–5.0)
Alkaline Phosphatase: 31 U/L — ABNORMAL LOW (ref 38–126)
Anion gap: 5 (ref 5–15)
BUN: 23 mg/dL (ref 8–23)
CO2: 30 mmol/L (ref 22–32)
Calcium: 8.9 mg/dL (ref 8.9–10.3)
Chloride: 104 mmol/L (ref 98–111)
Creatinine: 0.7 mg/dL (ref 0.44–1.00)
GFR, Estimated: >60 mL/min (ref >60)
Glucose, Bld: 106 mg/dL — ABNORMAL HIGH (ref 70–99)
Potassium: 3.8 mmol/L (ref 3.5–5.1)
Sodium: 139 mmol/L (ref 135–145)
Total Bilirubin: 0.7 mg/dL (ref 0.0–1.2)
Total Protein: 7.1 g/dL (ref 6.5–8.1)

## 2023-08-25 NOTE — Telephone Encounter (Signed)
 Christine [name]

## 2023-08-25 NOTE — Progress Notes (Signed)
 [facility] Cancer Follow up:    Christine Trula SQUIBB, MD [address] [place] [address]   DIAGNOSIS:  Cancer Staging  Malignant neoplasm of upper-outer quadrant of left breast in female, estrogen receptor positive (HCC) Staging form: Breast, AJCC 8th Edition - Clinical stage from 07/29/2021: Stage IA (cT1c, cN0, cM0, G3, ER+, PR-, HER2+) - Signed by Lanell Donald Stagger, PA-C on 07/31/2021 Stage prefix: Initial diagnosis Method of lymph node assessment: Clinical Histologic grading system: 3 grade system   SUMMARY OF ONCOLOGIC HISTORY: Oncology History  Malignant neoplasm of upper-outer quadrant of left breast in female, estrogen receptor positive (HCC)  07/17/2021 Mammogram   Suspicious left breast mass at 3 o'clock, 4 cm from the nipple. No axillary adenopathy. Targeted ultrasound is performed, showing an irregular centrally hypoechoic mass with an echogenic rim measuring 7 x 5 by 7 mm, thought to correlate with the mammographically identified mass. This mass is located at 3 o'clock, 4 cm from the nipple. No axillary adenopathy.   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female, estrogen receptor positive (HCC)   07/29/2021 Cancer Staging   Staging form: Breast, AJCC 8th Edition - Clinical stage from 07/29/2021: Stage IA (cT1c, cN0, cM0, G3, ER+, PR-, HER2+) - Signed by Lanell Donald Stagger, PA-C on 07/31/2021 Stage prefix: Initial diagnosis Method of lymph node assessment: Clinical Histologic grading system: 3 grade system    Pathology Results   Path showed IDC, grade 3, ER 100% positive, strong staining intensity, PR negative, ki 67 35%, Her 2 positive   08/05/2021 Genetic Testing   Negative hereditary cancer genetic testing: no pathogenic variants detected in [employer] BRCAPlus Panel and [employer] CustomNext-Cancer +RNAinsight Panel.  Report dates are August 05, 2021 and August 08, 2021.    The BRCAplus panel  offered by [employer] and includes sequencing and deletion/duplication analysis for the following 8 genes: ATM, BRCA1, BRCA2, CDH1, CHEK2, PALB2, PTEN, and TP53. The CustomNext-Cancer+RNAinsight panel offered by [employer] includes sequencing and rearrangement analysis for the following 47 genes:  APC, ATM, AXIN2, BARD1, BMPR1A, BRCA1, BRCA2, BRIP1, CDH1, CDK4, CDKN2A, CHEK2, DICER1, EPCAM, GREM1, HOXB13, MEN1, MLH1, MSH2, MSH3, MSH6, MUTYH, NBN, NF1, NF2, NTHL1, PALB2, PMS2, POLD1, POLE, PTEN, RAD51C, RAD51D, RECQL, RET, SDHA, SDHAF2, SDHB, SDHC, SDHD, SMAD4, SMARCA4, STK11, TP53, TSC1, TSC2, and VHL.  RNA data is routinely analyzed for use in variant interpretation for all genes.   08/09/2021 Surgery   Left lumpectomy: IDC, grade 3, 3 sentinel lymph nodes all negative for cancer.  He has biomarker testing ER positive, PR negative, HER2 positive.  pT1c, PN0.   09/12/2021 -  Adjuvant Chemotherapy   Taxol /Herceptin  weekly x12, followed by Herceptin  given every 3 weeks to complete 1 year of treatment.     CURRENT THERAPY: Herceptin ; Tamoxifen  daily  INTERVAL HISTORY:  Christine [name] 63 y.o. female returns for follow up.  Discussed the use of AI scribe software for clinical note transcription with the patient, who gave verbal consent to proceed.  History of Present Illness Christine [name] is a 62 year old female with breast cancer who presents for follow-up regarding her treatment with tamoxifen .  She is currently on tamoxifen  therapy and experiences minimal side effects, including occasional hot flashes and joint pain, which she attributes to either the medication or her physical activity. She remains active, which she believes helps manage her symptoms.  She is also taking alendronate for bone health, although  the specific dose and frequency were not mentioned. No changes in breathing, breast changes, urination issues, headaches, double vision, or falls. She reports no new symptoms or  concerns related to her breast cancer history.  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 She finds this arrangement enjoyable and manageable.   Rest of the pertinent 10 point ROS reviewed and neg  Patient Active Problem List   Diagnosis Date Noted   Antineoplastic chemotherapy induced anemia 10/18/2021   Port-A-Cath in place 09/12/2021   Iron deficiency anemia 09/02/2021   Genetic testing 08/05/2021   Family history of breast cancer 07/31/2021   Atherosclerotic heart disease 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 07/31/2021   Easy bruising 07/31/2021   Essential hypertension 07/31/2021   Hearing loss in left ear 07/31/2021   Insomnia 07/31/2021   Mixed hyperlipidemia 07/31/2021   Other long term (current) drug therapy 07/31/2021   Sleep disorder 07/31/2021   Tendency toward bleeding easily (HCC) 07/31/2021   Vitamin D deficiency 07/31/2021   Malignant neoplasm of upper-outer quadrant of left breast in female, estrogen receptor positive (HCC) 07/25/2021   Abnormal finding on mammography 07/10/2021   GERD (gastroesophageal reflux disease) 08/31/2019   Globus pharyngeus 08/02/2019   Rhinitis, chronic 08/02/2019   Elevated blood pressure reading 01/26/2019   Acute pain of right knee 10/26/2017    is allergic to lisinopril and mefloquine.  MEDICAL HISTORY: Past Medical History:  Diagnosis Date   Coronary artery disease    Family history of breast cancer 07/31/2021   GERD (gastroesophageal reflux disease)    Hypertension    Personal history of chemotherapy    Personal history of radiation therapy 01/08/2022   ended 02-06-22    SURGICAL HISTORY: Past Surgical History:  Procedure Laterality Date   BREAST LUMPECTOMY Left 08/09/2021   BREAST LUMPECTOMY WITH RADIOACTIVE SEED AND SENTINEL LYMPH NODE BIOPSY Left 08/09/2021   Procedure: LEFT BREAST RADIOACTIVE SEED  LOCALIZED LUMPECTOMY AND SENTINEL NODE BIOPSY;  Surgeon: Curvin Deward MOULD, MD;  Location: [facility] OR;  Service: General;  Laterality: Left;  GEN & PEC BLOCK   CESAREAN SECTION     FOOT SURGERY Right    PORT-A-CATH REMOVAL Right 09/19/2022   Procedure: REMOVAL PORT-A-CATH;  Surgeon: Curvin Deward MOULD, MD;  Location: [facility];  Service: General;  Laterality: Right;  45 MINS NEEDED IN ROOM 8   PORTACATH PLACEMENT Right 08/09/2021   Procedure: PORT PLACEMENT RIGHT  INTERNAL JUGULAR  WITH ULTRASOUND GUIDANCE;  Surgeon: Curvin Deward MOULD, MD;  Location: [facility] OR;  Service: General;  Laterality: Right;  RIGHT INTERNAL JUGULAR PLACEMENT    SOCIAL HISTORY: Social History   Socioeconomic History   Marital status: Married    Spouse name: Not on file   Number of children: Not on file   Years of education: Not on file   Highest education level: Not on file  Occupational History   Not on file  Tobacco Use   Smoking status: Never   Smokeless tobacco: Never  Vaping Use   Vaping status: Never Used  Substance and Sexual Activity   Alcohol use: Yes    Alcohol/week: 2.0 standard drinks of alcohol    Types: 2 Glasses of wine per week   Drug use: Never   Sexual activity: Not on file  Other Topics Concern   Not on file  Social History Narrative   Not on file  Social Drivers of Corporate investment banker Strain: Not on file  Food Insecurity: Not on file  Transportation Needs: Not on file  Physical Activity: Not on file  Stress: Not on file  Social Connections: Not on file  Intimate Partner Violence: Not on file    FAMILY HISTORY: Family History  Problem Relation Age of Onset   Heart disease Father    Breast cancer Sister 10   Esophageal cancer Maternal Uncle        dx after age 43   Lung cancer Paternal Uncle    Skin cancer Paternal Uncle     Review of Systems  Constitutional:  Negative for appetite change, chills, fatigue, fever and unexpected weight change.  HENT:   Negative  for hearing loss, lump/mass and trouble swallowing.   Eyes:  Negative for eye problems and icterus.  Respiratory:  Negative for chest tightness, cough and shortness of breath.   Cardiovascular:  Negative for chest pain, leg swelling and palpitations.  Gastrointestinal:  Negative for abdominal distention, abdominal pain, constipation, diarrhea, nausea and vomiting.  Endocrine: Negative for hot flashes.  Genitourinary:  Negative for difficulty urinating.   Musculoskeletal:  Negative for arthralgias.  Skin:  Negative for itching and rash.  Neurological:  Negative for dizziness, extremity weakness, headaches and numbness.  Hematological:  Negative for adenopathy. Does not bruise/bleed easily.  Psychiatric/Behavioral:  Negative for depression. The patient is not nervous/anxious.       PHYSICAL EXAMINATION  ECOG PERFORMANCE STATUS: 1 - Symptomatic but completely ambulatory  Vitals:   08/25/23 1158  BP: 123/64  Pulse: 73  Resp: 16  Temp: 98.4 F (36.9 C)   General : Alert, oriented and in no acute distess Both breasts inspected, no palpable masses or regional adenopathy No LE edema.    LABORATORY DATA:  CBC    Component Value Date/Time   WBC 8.9 08/25/2023 1227   RBC 4.20 08/25/2023 1227   HGB 12.7 08/25/2023 1227   HGB 11.6 (L) 08/21/2022 0833   HCT 38.0 08/25/2023 1227   PLT 231 08/25/2023 1227   PLT 227 08/21/2022 0833   MCV 90.5 08/25/2023 1227   MCH 30.2 08/25/2023 1227   MCHC 33.4 08/25/2023 1227   RDW 12.4 08/25/2023 1227   LYMPHSABS 1.4 08/25/2023 1227   MONOABS 0.4 08/25/2023 1227   EOSABS 0.0 08/25/2023 1227   BASOSABS 0.0 08/25/2023 1227    CMP     Component Value Date/Time   NA 139 08/25/2023 1227   NA 142 10/31/2019 1545   K 3.8 08/25/2023 1227   CL 104 08/25/2023 1227   CO2 30 08/25/2023 1227   GLUCOSE 106 (H) 08/25/2023 1227   BUN 23 08/25/2023 1227   BUN 11 10/31/2019 1545   CREATININE 0.70 08/25/2023 1227   CALCIUM  8.9 08/25/2023 1227    PROT 7.1 08/25/2023 1227   PROT 6.7 08/01/2020 1004   ALBUMIN 4.3 08/25/2023 1227   ALBUMIN 4.5 08/01/2020 1004   AST 21 08/25/2023 1227   ALT 18 08/25/2023 1227   ALKPHOS 31 (L) 08/25/2023 1227   BILITOT 0.7 08/25/2023 1227   GFRNONAA >60 08/25/2023 1227   GFRAA 104 10/31/2019 1545      ASSESSMENT and THERAPY PLAN:   Malignant neoplasm of upper-outer quadrant of left breast in female, estrogen receptor positive (HCC) Christine [name] is a 63 year old woman with stage Ia ER positive, PR negative, Her 2 amplified breast cancer status postlumpectomy, adj chemo and herceptin  maintenance and now on adj tamoxifen .  Assessment and Plan Assessment & Plan Breast cancer Diagnosed two years ago, on tamoxifen  with minimal side effects. Discussed optional Guardant reveal.  - Continue tamoxifen . - Provide pamphlet for Guardant reveal - Order liver function tests due to tamoxifen  use. - Schedule follow-up in six months. - Most recent mammogram neg - scattered density noted on breast exam with no obvious evidence of malignancy.  Joint pain Experiencing joint pain, possibly related to tamoxifen  or physical activity. - Continue physical activity as tolerated.     All questions were answered. The patient knows to call the clinic with any problems, questions or concerns. We can certainly see the patient much sooner if necessary. This note was electronically signed.  Total encounter time:30 minutes*in face-to-face visit time, chart review, lab review, care coordination, order entry, and documentation of the encounter time.  *Total Encounter Time as defined by the Centers for Medicare and Medicaid Services includes, in addition to the face-to-face time of a patient visit (documented in the note above) non-face-to-face time: obtaining and reviewing outside history, ordering and reviewing medications, tests or procedures, care coordination (communications with other health care professionals or caregivers)  and documentation in the medical record.

## 2023-08-25 NOTE — Assessment & Plan Note (Signed)
 Christine [name] is a 63 year old woman with stage Ia ER positive, PR negative, Her 2 amplified breast cancer status postlumpectomy, adj chemo and herceptin  maintenance and now on adj tamoxifen .  Assessment and Plan Assessment & Plan Breast cancer Diagnosed two years ago, on tamoxifen  with minimal side effects. Discussed optional Guardant reveal.  - Continue tamoxifen . - Provide pamphlet for Guardant reveal - Order liver function tests due to tamoxifen  use. - Schedule follow-up in six months. - Most recent mammogram neg - scattered density noted on breast exam with no obvious evidence of malignancy.  Joint pain Experiencing joint pain, possibly related to tamoxifen  or physical activity. - Continue physical activity as tolerated.

## 2023-08-25 NOTE — Progress Notes (Signed)
 [facility] Cancer Follow up:    Christine Trula SQUIBB, MD [address] [place] [address]   DIAGNOSIS:  Cancer Staging  Malignant neoplasm of upper-outer quadrant of left breast in female, estrogen receptor positive (HCC) Staging form: Breast, AJCC 8th Edition - Clinical stage from 07/29/2021: Stage IA (cT1c, cN0, cM0, G3, ER+, PR-, HER2+) - Signed by Lanell Donald Stagger, PA-C on 07/31/2021 Stage prefix: Initial diagnosis Method of lymph node assessment: Clinical Histologic grading system: 3 grade system   SUMMARY OF ONCOLOGIC HISTORY: Oncology History  Malignant neoplasm of upper-outer quadrant of left breast in female, estrogen receptor positive (HCC)  07/17/2021 Mammogram   Suspicious left breast mass at 3 o'clock, 4 cm from the nipple. No axillary adenopathy. Targeted ultrasound is performed, showing an irregular centrally hypoechoic mass with an echogenic rim measuring 7 x 5 by 7 mm, thought to correlate with the mammographically identified mass. This mass is located at 3 o'clock, 4 cm from the nipple. No axillary adenopathy.   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female, estrogen receptor positive (HCC)   07/29/2021 Cancer Staging   Staging form: Breast, AJCC 8th Edition - Clinical stage from 07/29/2021: Stage IA (cT1c, cN0, cM0, G3, ER+, PR-, HER2+) - Signed by Lanell Donald Stagger, PA-C on 07/31/2021 Stage prefix: Initial diagnosis Method of lymph node assessment: Clinical Histologic grading system: 3 grade system    Pathology Results   Path showed IDC, grade 3, ER 100% positive, strong staining intensity, PR negative, ki 67 35%, Her 2 positive   08/05/2021 Genetic Testing   Negative hereditary cancer genetic testing: no pathogenic variants detected in [employer] BRCAPlus Panel and [employer] CustomNext-Cancer +RNAinsight Panel.  Report dates are August 05, 2021 and August 08, 2021.    The BRCAplus panel  offered by [employer] and includes sequencing and deletion/duplication analysis for the following 8 genes: ATM, BRCA1, BRCA2, CDH1, CHEK2, PALB2, PTEN, and TP53. The CustomNext-Cancer+RNAinsight panel offered by [employer] includes sequencing and rearrangement analysis for the following 47 genes:  APC, ATM, AXIN2, BARD1, BMPR1A, BRCA1, BRCA2, BRIP1, CDH1, CDK4, CDKN2A, CHEK2, DICER1, EPCAM, GREM1, HOXB13, MEN1, MLH1, MSH2, MSH3, MSH6, MUTYH, NBN, NF1, NF2, NTHL1, PALB2, PMS2, POLD1, POLE, PTEN, RAD51C, RAD51D, RECQL, RET, SDHA, SDHAF2, SDHB, SDHC, SDHD, SMAD4, SMARCA4, STK11, TP53, TSC1, TSC2, and VHL.  RNA data is routinely analyzed for use in variant interpretation for all genes.   08/09/2021 Surgery   Left lumpectomy: IDC, grade 3, 3 sentinel lymph nodes all negative for cancer.  He has biomarker testing ER positive, PR negative, HER2 positive.  pT1c, PN0.   09/12/2021 -  Adjuvant Chemotherapy   Taxol /Herceptin  weekly x12, followed by Herceptin  given every 3 weeks to complete 1 year of treatment.     CURRENT THERAPY: Herceptin ; Tamoxifen  daily  INTERVAL HISTORY:  Christine [name] 63 y.o. female returns for follow up.   Rest of the pertinent 10 point ROS reviewed and neg  Patient Active Problem List   Diagnosis Date Noted   Antineoplastic chemotherapy induced anemia 10/18/2021   Port-A-Cath in place 09/12/2021   Iron deficiency anemia 09/02/2021   Genetic testing 08/05/2021   Family history of breast cancer 07/31/2021   Atherosclerotic heart disease 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 07/31/2021   Easy bruising 07/31/2021   Essential hypertension 07/31/2021   Hearing loss in left ear 07/31/2021   Insomnia 07/31/2021   Mixed hyperlipidemia 07/31/2021   Other  long term (current) drug therapy 07/31/2021   Sleep disorder 07/31/2021   Tendency toward bleeding easily (HCC) 07/31/2021   Vitamin D deficiency 07/31/2021   Malignant neoplasm of upper-outer quadrant of  left breast in female, estrogen receptor positive (HCC) 07/25/2021   Abnormal finding on mammography 07/10/2021   GERD (gastroesophageal reflux disease) 08/31/2019   Globus pharyngeus 08/02/2019   Rhinitis, chronic 08/02/2019   Elevated blood pressure reading 01/26/2019   Acute pain of right knee 10/26/2017    is allergic to lisinopril and mefloquine.  MEDICAL HISTORY: Past Medical History:  Diagnosis Date   Coronary artery disease    Family history of breast cancer 07/31/2021   GERD (gastroesophageal reflux disease)    Hypertension    Personal history of chemotherapy    Personal history of radiation therapy 01/08/2022   ended 02-06-22    SURGICAL HISTORY: Past Surgical History:  Procedure Laterality Date   BREAST LUMPECTOMY Left 08/09/2021   BREAST LUMPECTOMY WITH RADIOACTIVE SEED AND SENTINEL LYMPH NODE BIOPSY Left 08/09/2021   Procedure: LEFT BREAST RADIOACTIVE SEED LOCALIZED LUMPECTOMY AND SENTINEL NODE BIOPSY;  Surgeon: Curvin Deward MOULD, MD;  Location: [facility] OR;  Service: General;  Laterality: Left;  GEN & PEC BLOCK   CESAREAN SECTION     FOOT SURGERY Right    PORT-A-CATH REMOVAL Right 09/19/2022   Procedure: REMOVAL PORT-A-CATH;  Surgeon: Curvin Deward MOULD, MD;  Location: [facility];  Service: General;  Laterality: Right;  45 MINS NEEDED IN ROOM 8   PORTACATH PLACEMENT Right 08/09/2021   Procedure: PORT PLACEMENT RIGHT  INTERNAL JUGULAR  WITH ULTRASOUND GUIDANCE;  Surgeon: Curvin Deward MOULD, MD;  Location: [facility];  Service: General;  Laterality: Right;  RIGHT INTERNAL JUGULAR PLACEMENT    SOCIAL HISTORY: Social History   Socioeconomic History   Marital status: Married    Spouse name: Not on file   Number of children: Not on file   Years of education: Not on file   Highest education level: Not on file  Occupational History   Not on file  Tobacco Use   Smoking status: Never   Smokeless tobacco: Never  Vaping Use   Vaping status: Never Used  Substance and  Sexual Activity   Alcohol use: Yes    Alcohol/week: 2.0 standard drinks of alcohol    Types: 2 Glasses of wine per week   Drug use: Never   Sexual activity: Not on file  Other Topics Concern   Not on file  Social History Narrative   Not on file   Social Drivers of Health   Financial Resource Strain: Not on file  Food Insecurity: Not on file  Transportation Needs: Not on file  Physical Activity: Not on file  Stress: Not on file  Social Connections: Not on file  Intimate Partner Violence: Not on file    FAMILY HISTORY: Family History  Problem Relation Age of Onset   Heart disease Father    Breast cancer Sister 63   Esophageal cancer Maternal Uncle        dx after age 2   Lung cancer Paternal Uncle    Skin cancer Paternal Uncle     Review of Systems  Constitutional:  Negative for appetite change, chills, fatigue, fever and unexpected weight change.  HENT:   Negative for hearing loss, lump/mass and trouble swallowing.   Eyes:  Negative for eye problems and icterus.  Respiratory:  Negative for chest tightness, cough and shortness of breath.   Cardiovascular:  Negative for chest pain, leg swelling and palpitations.  Gastrointestinal:  Negative for abdominal distention, abdominal pain, constipation, diarrhea, nausea and vomiting.  Endocrine: Negative for hot flashes.  Genitourinary:  Negative for difficulty urinating.   Musculoskeletal:  Negative for arthralgias.  Skin:  Negative for itching and rash.  Neurological:  Negative for dizziness, extremity weakness, headaches and numbness.  Hematological:  Negative for adenopathy. Does not bruise/bleed easily.  Psychiatric/Behavioral:  Negative for depression. The patient is not nervous/anxious.       PHYSICAL EXAMINATION  ECOG PERFORMANCE STATUS: 1 - Symptomatic but completely ambulatory  Vitals:   08/25/23 1158  BP: 123/64  Pulse: 73  Resp: 16  Temp: 98.4 F (36.9 C)   General : Alert, oriented and in no acute  distess Both breasts inspected, no palpable masses or regional adenopathy No LE edema.    LABORATORY DATA:  CBC    Component Value Date/Time   WBC 5.7 08/21/2022 0833   WBC 6.8 06/16/2019 1456   RBC 3.79 (L) 08/21/2022 0833   HGB 11.6 (L) 08/21/2022 0833   HCT 34.5 (L) 08/21/2022 0833   PLT 227 08/21/2022 0833   MCV 91.0 08/21/2022 0833   MCH 30.6 08/21/2022 0833   MCHC 33.6 08/21/2022 0833   RDW 12.4 08/21/2022 0833   LYMPHSABS 1.4 08/21/2022 0833   MONOABS 0.5 08/21/2022 0833   EOSABS 0.3 08/21/2022 0833   BASOSABS 0.1 08/21/2022 0833    CMP     Component Value Date/Time   NA 141 08/21/2022 0833   NA 142 10/31/2019 1545   K 4.0 08/21/2022 0833   CL 108 08/21/2022 0833   CO2 29 08/21/2022 0833   GLUCOSE 80 08/21/2022 0833   BUN 16 08/21/2022 0833   BUN 11 10/31/2019 1545   CREATININE 0.74 08/21/2022 0833   CALCIUM  8.6 (L) 08/21/2022 0833   PROT 6.3 (L) 08/21/2022 0833   PROT 6.7 08/01/2020 1004   ALBUMIN 3.9 08/21/2022 0833   ALBUMIN 4.5 08/01/2020 1004   AST 21 08/21/2022 0833   ALT 21 08/21/2022 0833   ALKPHOS 33 (L) 08/21/2022 0833   BILITOT 0.7 08/21/2022 0833   GFRNONAA >60 08/21/2022 0833   GFRAA 104 10/31/2019 1545      ASSESSMENT and THERAPY PLAN:   No problem-specific Assessment & Plan notes found for this encounter.   All questions were answered. The patient knows to call the clinic with any problems, questions or concerns. We can certainly see the patient much sooner if necessary. This note was electronically signed.  Total encounter time:30 minutes*in face-to-face visit time, chart review, lab review, care coordination, order entry, and documentation of the encounter time.  *Total Encounter Time as defined by the Centers for Medicare and Medicaid Services includes, in addition to the face-to-face time of a patient visit (documented in the note above) non-face-to-face time: obtaining and reviewing outside history, ordering and reviewing  medications, tests or procedures, care coordination (communications with other health care professionals or caregivers) and documentation in the medical record.

## 2023-08-26 ENCOUNTER — Other Ambulatory Visit: Payer: Self-pay

## 2023-08-31 ENCOUNTER — Ambulatory Visit: Attending: General Surgery

## 2023-08-31 VITALS — Wt 137.0 lb

## 2023-08-31 DIAGNOSIS — Z483 Aftercare following surgery for neoplasm: Secondary | ICD-10-CM | POA: Insufficient documentation

## 2023-08-31 NOTE — Therapy (Signed)
 OUTPATIENT PHYSICAL THERAPY [facility] SCREENING NOTE   Patient Name: Christine [name] MRN: [id number] DOB:1960/10/28, 63 y.o., female Today's Date: 08/31/2023  PCP: Dwight Trula SQUIBB, MD REFERRING PROVIDER: Curvin Deward MOULD, MD   PT End of Session - 08/31/23 0813     Visit Number 3   # unchanged due to screen only   PT Start Time 0811    PT Stop Time 0815    PT Time Calculation (min) 4 min    Activity Tolerance Patient tolerated treatment well    Behavior During Therapy [facility] for tasks assessed/performed          Past Medical History:  Diagnosis Date   Coronary artery disease    Family history of breast cancer 07/31/2021   GERD (gastroesophageal reflux disease)    Hypertension    Personal history of chemotherapy    Personal history of radiation therapy 01/08/2022   ended 02-06-22   Past Surgical History:  Procedure Laterality Date   BREAST LUMPECTOMY Left 08/09/2021   BREAST LUMPECTOMY WITH RADIOACTIVE SEED AND SENTINEL LYMPH NODE BIOPSY Left 08/09/2021   Procedure: LEFT BREAST RADIOACTIVE SEED LOCALIZED LUMPECTOMY AND SENTINEL NODE BIOPSY;  Surgeon: Curvin Deward MOULD, MD;  Location: [facility] OR;  Service: General;  Laterality: Left;  GEN & PEC BLOCK   CESAREAN SECTION     FOOT SURGERY Right    PORT-A-CATH REMOVAL Right 09/19/2022   Procedure: REMOVAL PORT-A-CATH;  Surgeon: Curvin Deward MOULD, MD;  Location: [facility];  Service: General;  Laterality: Right;  45 MINS NEEDED IN ROOM 8   PORTACATH PLACEMENT Right 08/09/2021   Procedure: PORT PLACEMENT RIGHT  INTERNAL JUGULAR  WITH ULTRASOUND GUIDANCE;  Surgeon: Curvin Deward MOULD, MD;  Location: [facility] OR;  Service: General;  Laterality: Right;  RIGHT INTERNAL JUGULAR PLACEMENT   Patient Active Problem List   Diagnosis Date Noted   Antineoplastic chemotherapy induced anemia 10/18/2021   Port-A-Cath in place 09/12/2021   Iron deficiency anemia 09/02/2021   Genetic testing 08/05/2021   Family history of breast cancer 07/31/2021    Atherosclerotic heart disease 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 07/31/2021   Easy bruising 07/31/2021   Essential hypertension 07/31/2021   Hearing loss in left ear 07/31/2021   Insomnia 07/31/2021   Mixed hyperlipidemia 07/31/2021   Other long term (current) drug therapy 07/31/2021   Sleep disorder 07/31/2021   Tendency toward bleeding easily (HCC) 07/31/2021   Vitamin D deficiency 07/31/2021   Malignant neoplasm of upper-outer quadrant of left breast in female, estrogen receptor positive (HCC) 07/25/2021   Abnormal finding on mammography 07/10/2021   GERD (gastroesophageal reflux disease) 08/31/2019   Globus pharyngeus 08/02/2019   Rhinitis, chronic 08/02/2019   Elevated blood pressure reading 01/26/2019   Acute pain of right knee 10/26/2017    REFERRING DIAG: left breast cancer at risk for lymphedema  THERAPY DIAG: Aftercare following surgery for neoplasm  PERTINENT HISTORY: Patient was diagnosed on 07/08/2021 with left grade 3 invasive ductal carcinoma breast cancer. It measures 7 mm and is located in the upper outer quadrant. It is ER positive, PR negative, and HER2 positive with a Ki67 of 35%. Left lumpectomy and SLNB on 08/09/21 with port placement and 3 negative lymph nodes. Underwent chemotherapy with taxol  and herceptin .   PRECAUTIONS: left UE Lymphedema risk  SUBJECTIVE: Patient returns for her first 6 month L-Dex screen.   PAIN:  Are you having pain? No  LYMPHEDEMA ASSESSMENTS:    Patient was assessed today using the SOZO  machine to determine the lymphedema index score. This was compared to her baseline score. It was determined that she is within the recommended range when compared to her baseline and no further action is needed at this time. She will continue SOZO screenings. These are done every 3 months for 2 years post operatively followed by every 6 months for 2 years, and then annually.        L-DEX FLOWSHEETS - 08/31/23 0800       L-DEX  LYMPHEDEMA SCREENING   Measurement Type Unilateral    L-DEX MEASUREMENT EXTREMITY Upper Extremity    POSITION  Standing    DOMINANT SIDE Right    At Risk Side Left    BASELINE SCORE (UNILATERAL) -2.1    L-DEX SCORE (UNILATERAL) -2.1    VALUE CHANGE (UNILAT) 0           P: Cont 6 month L-Dex screens next.   Berwyn Knights, PTA 08/31/23 8:14 AM

## 2023-09-01 ENCOUNTER — Other Ambulatory Visit: Payer: Self-pay

## 2023-09-04 ENCOUNTER — Other Ambulatory Visit: Payer: Self-pay

## 2023-09-04 ENCOUNTER — Emergency Department (HOSPITAL_BASED_OUTPATIENT_CLINIC_OR_DEPARTMENT_OTHER)
Admission: EM | Admit: 2023-09-04 | Discharge: 2023-09-04 | Disposition: A | Discharge status: Home / Self Care | Attending: Emergency Medicine | Admitting: Emergency Medicine

## 2023-09-04 ENCOUNTER — Encounter (HOSPITAL_BASED_OUTPATIENT_CLINIC_OR_DEPARTMENT_OTHER): Payer: Self-pay

## 2023-09-04 ENCOUNTER — Emergency Department (HOSPITAL_BASED_OUTPATIENT_CLINIC_OR_DEPARTMENT_OTHER)

## 2023-09-04 DIAGNOSIS — Y92000 Kitchen of unspecified non-institutional (private) residence as  the place of occurrence of the external cause: Secondary | ICD-10-CM | POA: Insufficient documentation

## 2023-09-04 DIAGNOSIS — S61311A Laceration without foreign body of left index finger with damage to nail, initial encounter: Secondary | ICD-10-CM | POA: Insufficient documentation

## 2023-09-04 DIAGNOSIS — W260XXA Contact with knife, initial encounter: Secondary | ICD-10-CM | POA: Insufficient documentation

## 2023-09-04 DIAGNOSIS — Z7982 Long term (current) use of aspirin: Secondary | ICD-10-CM | POA: Insufficient documentation

## 2023-09-04 DIAGNOSIS — Z79899 Other long term (current) drug therapy: Secondary | ICD-10-CM | POA: Insufficient documentation

## 2023-09-04 DIAGNOSIS — S6992XA Unspecified injury of left wrist, hand and finger(s), initial encounter: Secondary | ICD-10-CM | POA: Diagnosis present

## 2023-09-04 MED ORDER — LIDOCAINE HCL (PF) 1 % IJ SOLN
10.0000 mL | Freq: Once | INTRAMUSCULAR | Status: AC
  Administered 2023-09-04: 10 mL
  Filled 2023-09-04: qty 10

## 2023-09-04 NOTE — ED Triage Notes (Signed)
 Pt advises lac to L index finger using kitchen knife this AM. Bleeding controlled at time of triage

## 2023-09-04 NOTE — Discharge Instructions (Signed)
 You had 3 absorbable sutures placed today.  They should start dissolving on their own within the next 2 weeks.  The nail has not fallen off yet, but if it starts to fall off and you need it removed, please return to the emergency room.  You may gently clean the area around your laceration as needed with water. Place antibiotic ointment such as bacitracin or neosporin over your laceration after cleaning the area.  Keep the laceration covered with sterile gauze as shown here if you are doing an activity in which it may get dirty. You may pick these supplies up at any drugstore.  Do not submerge your laceration in water (no baths, swimming) until it is fully healed. You may shower.   You may take up to 1000mg  of tylenol  every 6 hours as needed for pain. Do not take more then 4g per day.   You may use up to 600mg  ibuprofen every 6 hours as needed for pain.  Do not exceed 2.4g of ibuprofen per day.  Return to the ER should you develop fever, chills, pus drainage from your wound, redness around your wound.

## 2023-09-04 NOTE — ED Provider Notes (Signed)
 [place] [facility]ENC[facility] DE[facility]MENT AT [facility] Provider Note   CSN: [id number] Arrival date & time: 09/04/23  1225     Patient presents with: No chief complaint on file.   Christine [name] is a 63 y.o. female with no sniffing past medical history presents with concern for laceration to the left index finger that occurred just prior to arrival.  States she was cutting food with a kitchen knife, and it slipped and cut her finger.  Bleeding well-controlled upon arrival.  Reports last tetanus was within the last 10 years.  Denies any numbness or tingling in the finger.  Denies any difficulty with range of motion.   HPI     Prior to Admission medications   Medication Sig Start Date End Date Taking? Authorizing Provider  acetaminophen  (TYLENOL ) 500 MG tablet Take 1,000 mg by mouth every 6 (six) hours as needed for moderate pain. 08/05/21   [provider]  aspirin EC 81 MG tablet Take 81 mg by mouth daily. Swallow whole. 10/06/19   [provider]  Cholecalciferol (DIALYVITE VITAMIN D 5000) 125 MCG (5000 UT) capsule Take 15,000 Units by mouth every Monday. 08/05/21   [provider]  MAGNESIUM PO Take 150 mg by mouth 3 (three) times a week. 08/05/21   [provider]  nitroGLYCERIN  (NITROSTAT ) 0.4 MG SL tablet Place 1 tablet (0.4 mg total) under the tongue every 5 (five) minutes as needed for chest pain. 12/06/19 03/06/22  Lonni Slain, MD  Omega-3 Fatty Acids (OMEGA 3 500) 500 MG CAPS Take 500 mg by mouth daily. 08/05/21   [provider]  rosuvastatin  (CRESTOR ) 40 MG tablet TAKE 1 TABLET BY MOUTH EVERY DAY 02/03/23   Lonni Slain, MD  tamoxifen  (NOLVADEX ) 20 MG tablet Take 1 tablet (20 mg total) by mouth daily. 01/30/23   Iruku, Praveena, MD  valsartan -hydrochlorothiazide  (DIOVAN -HCT) 80-12.5 MG tablet TAKE 1 TABLET BY MOUTH EVERY DAY 11/13/22   Lonni Slain, MD  zolpidem (AMBIEN) 10 MG tablet Take 5-10 mg by mouth at  bedtime as needed for sleep. 09/14/19   [provider]    Allergies: Lisinopril and Mefloquine    Review of Systems  Skin:  Positive for wound.    Updated Vital Signs BP (!) 158/70 (BP Location: Right Arm)   Pulse 60   Temp 98 F (36.7 C) (Oral)   Resp 16   SpO2 98%   Physical Exam Vitals and nursing note reviewed.  Constitutional:      General: She is not in acute distress.    Appearance: Normal appearance.  Cardiovascular:     Rate and Rhythm: Normal rate and regular rhythm.     Comments: Cap refill intact in the left index finger.  Radial pulse 2+ bilaterally in the left upper extremity Pulmonary:     Effort: Pulmonary effort is normal.  Musculoskeletal:     Comments: Left Index finger  General 1.5 cm laceration through the left index finger distal nail.  Bleeding well-controlled upon arrival.  No obvious foreign body  Palpation Tender of the proximal, middle, distal phalanx of the left index finger  ROM Full flexion and extension of the left index finger MCP, PIP, DIP  Sensation: Sensation intact throughout the left index finger  Neurological:     Mental Status: She is alert.     (all labs ordered are listed, but only abnormal results are displayed) Labs Reviewed - No data to display  EKG: None  Radiology: DG Finger Index Left Result  Date: 09/04/2023 CLINICAL DATA:  Distal left second digit laceration. EXAM: LEFT INDEX FINGER 2+V COMPARISON:  None Available. FINDINGS: No acute fracture or dislocation. 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 No radiopaque foreign body. IMPRESSION: No acute fracture or dislocation.  No radiopaque foreign body. Electronically Signed   By: Rogelia Myers M.D.   On: 09/04/2023 13:36     .Laceration Repair  Date/Time: 09/04/2023 3:32 PM  Performed by: Veta Palma, PA-C Authorized by: Veta Palma, PA-C   Consent:    Consent obtained:  Verbal   Consent given by:   Patient   Risks discussed:  Infection, pain, poor cosmetic result, poor wound healing and need for additional repair   Alternatives discussed:  No treatment Universal protocol:    Procedure explained and questions answered to patient or proxy's satisfaction: yes     Relevant documents present and verified: yes     Imaging studies available: yes     Patient identity confirmed:  Verbally with patient Anesthesia:    Anesthesia method:  Nerve block   Block location:  Left index finger   Block needle gauge:  25 G   Block anesthetic:  Lidocaine  1% w/o epi (6ml)   Block injection procedure:  Introduced needle, incremental injection, negative aspiration for blood, anatomic landmarks identified and anatomic landmarks palpated   Block outcome:  Anesthesia achieved Laceration details:    Location:  Finger   Finger location:  L index finger   Length (cm):  1.5   Depth (mm):  1 Pre-procedure details:    Preparation:  Patient was prepped and draped in usual sterile fashion and imaging obtained to evaluate for foreign bodies Exploration:    Imaging obtained: x-ray     Imaging outcome: foreign body not noted     Wound exploration: wound explored through full range of motion and entire depth of wound visualized     Wound extent: no foreign body, no nerve damage, no tendon damage, no underlying fracture and no vascular damage     Contaminated: no   Treatment:    Area cleansed with:  Chlorhexidine    Irrigation solution:  Sterile saline   Irrigation volume:  1L   Debridement:  None   Undermining:  None Skin repair:    Repair method:  Sutures   Suture size:  5-0   Suture material:  Chromic gut   Suture technique:  Simple interrupted   Number of sutures:  3 Repair type:    Repair type:  Simple Post-procedure details:    Dressing:  Antibiotic ointment and bulky dressing   Procedure completion:  Tolerated well, no immediate complications    Medications Ordered in the ED  lidocaine  (PF)  (XYLOCAINE ) 1 % injection 10 mL (10 mLs Infiltration Given 09/04/23 1421)                                    Medical Decision Making Amount and/or Complexity of Data Reviewed Radiology: ordered.  Risk Prescription drug management.    Differential diagnosis includes but is not limited to laceration, wound infection, tendon injury, nerve injury, vascular injury, retained foreign body, fracture, dislocation  ED Course:  Upon initial evaluation, patient is well-appearing, no acute distress.  Sustained laceration to the nail of the left index finger just prior to arrival.  This was by a clean knife.  Bleeding well-controlled upon arrival.   She has full range of  motion of the left index finger, intact sensation, no concern for tendon or nerve injury at this time. Nail with laceration but still intact on finger at this time. Tetanus was last updated in 2018, within 10-year window.  No indication for tetanus update at this time.   Imaging Studies ordered: I ordered imaging studies including x-ray left index finger  I independently visualized the imaging with scope of interpretation limited to determining acute life threatening conditions related to emergency care. Imaging showed  No acute abnormalities I agree with the radiologist interpretation   Medications Given: Lidocaine   Imaging was reviewed which revealed no retained foreign body or other acute injury. Patient's wound was irrigated well with sterile saline and cleaned with chlorhexidine  swabs. Anesthesia achieved with digital block with lidocaine . Patient's laceration was repaired with 3 simple interrupted chromic gut sutures. Patient tolerated this well without any immediate complications.  Still has full range of motion of the left index finger.  Suture repair occurred less than 24 hours after initial injury. Patient remains neurovascularly intact after suture placement.  Patient stable and appropriate for discharge home.      Impression: Left index finger Laceration  Disposition:  Patient discharged home with instructions to keep laceration site clean with gentle soap and water. Apply neosporin or bacitracin over the area as shown here and keep covered with sterile dressing. Tylenol  and ibuprofen as needed for pain.  Although nail not ready to come off at this time, she was instructed that if it starts to follow-up and she needs it removed, to return back to the ER. Return precautions given.    Record Review: External records from outside source obtained and reviewed including Tdap given in 2018     This chart was dictated using voice recognition software, Dragon. Despite the best efforts of this provider to proofread and correct errors, errors may still occur which can change documentation meaning.       Final diagnoses:  Laceration of left index finger without foreign body with damage to nail, initial encounter    ED Discharge Orders     None          Veta Palma, DEVONNA 09/04/23 1535    Cottie Donnice PARAS, MD 09/05/23 1029

## 2023-09-22 ENCOUNTER — Encounter: Payer: Self-pay | Admitting: Hematology and Oncology

## 2023-09-25 IMAGING — US US BREAST BX W LOC DEV 1ST LESION IMG BX SPEC US GUIDE*L*
1 series · 13 of 13 positions shown · non-contrast
Comparison: Previous exams.
COMPARISON: Previous exams.

Addendum:
CLINICAL DATA: Patient with a suspicious mass in the LEFT breast at
the 3 o'clock axis presents today for ultrasound-guided core biopsy.

EXAM:
ULTRASOUND GUIDED LEFT BREAST CORE NEEDLE BIOPSY

[Series 1: us breast bx w loc dev 1st lesion img bx spec us g · 0.07mm/px · 13 of 13 slices shown]
[im 1/13]
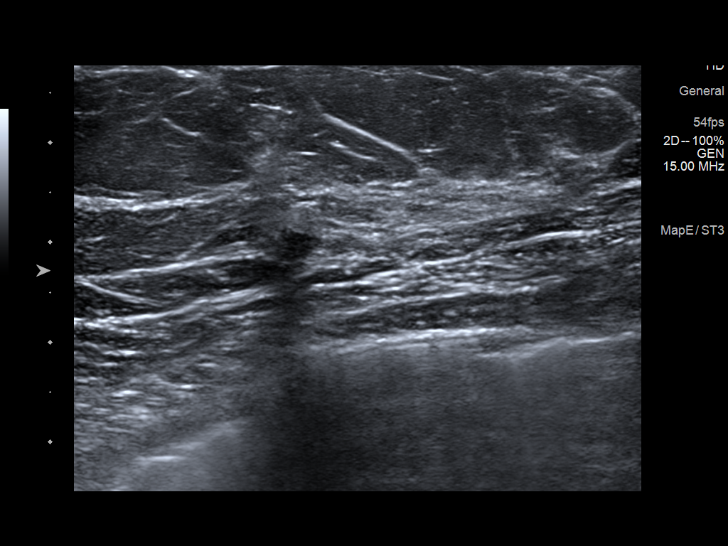
[im 2/13]
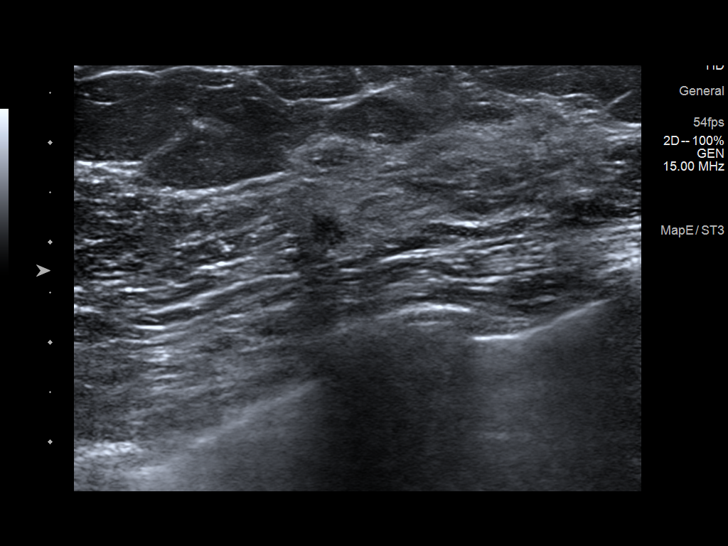
[im 3/13]
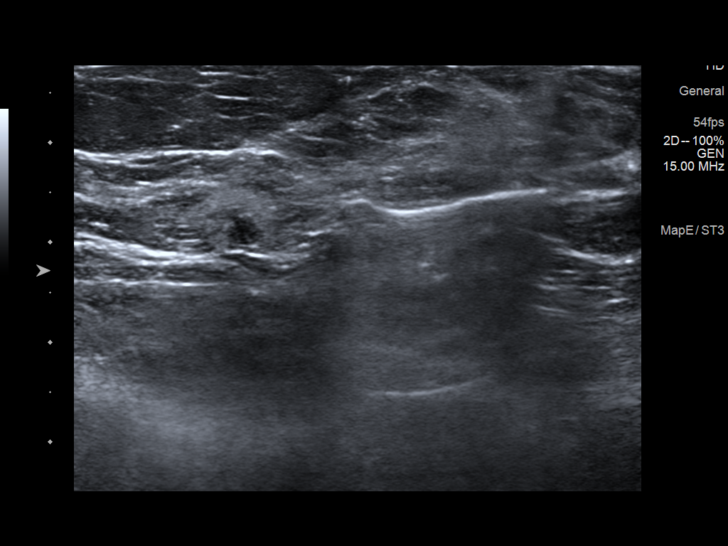
[im 4/13]
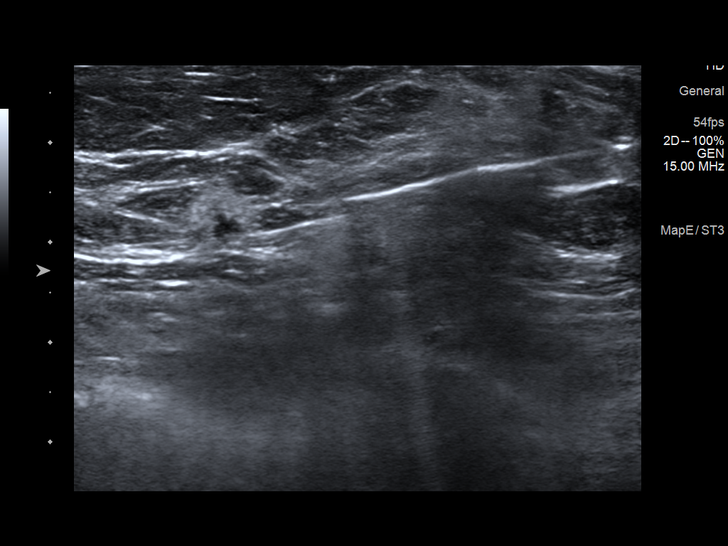
[im 5/13]
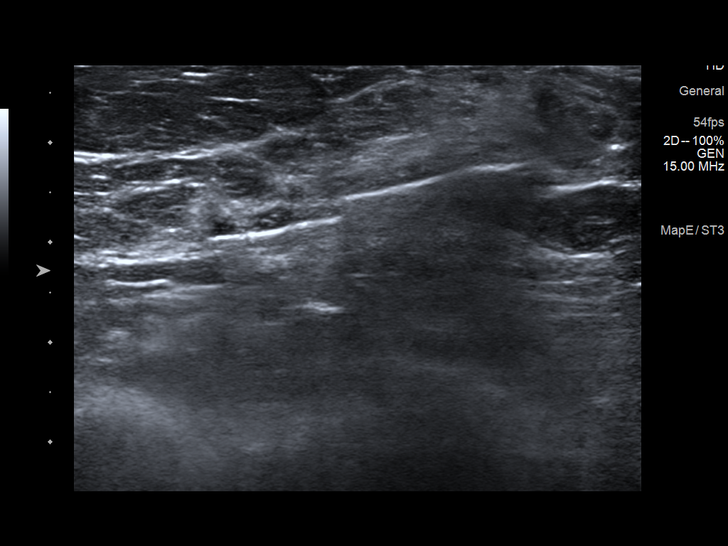
[im 6/13]
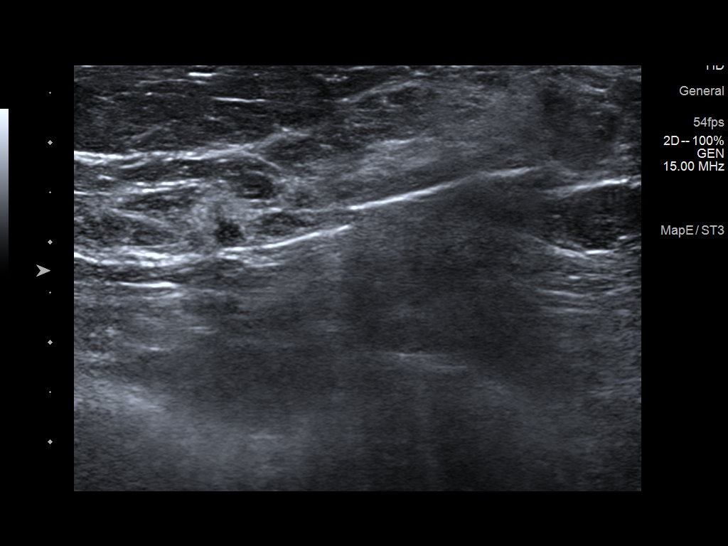
[im 7/13]
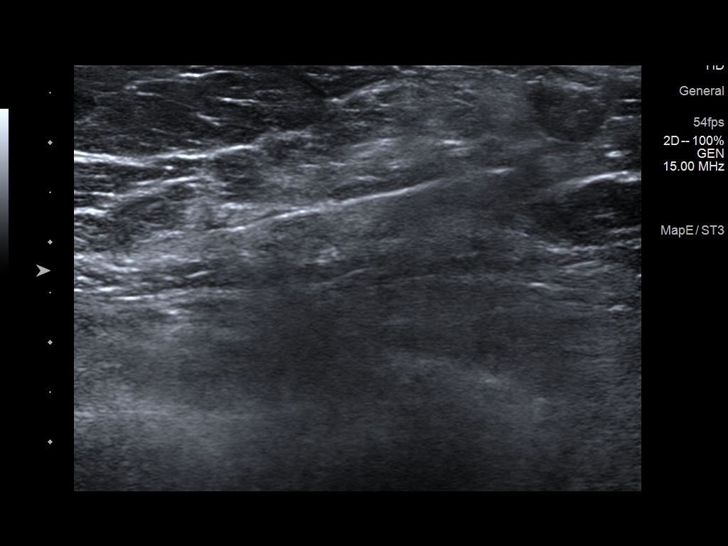
[im 8/13]
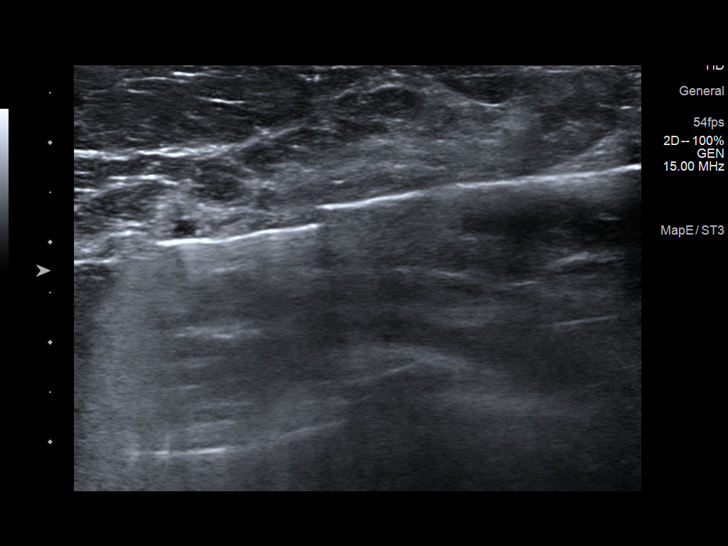
[im 9/13]
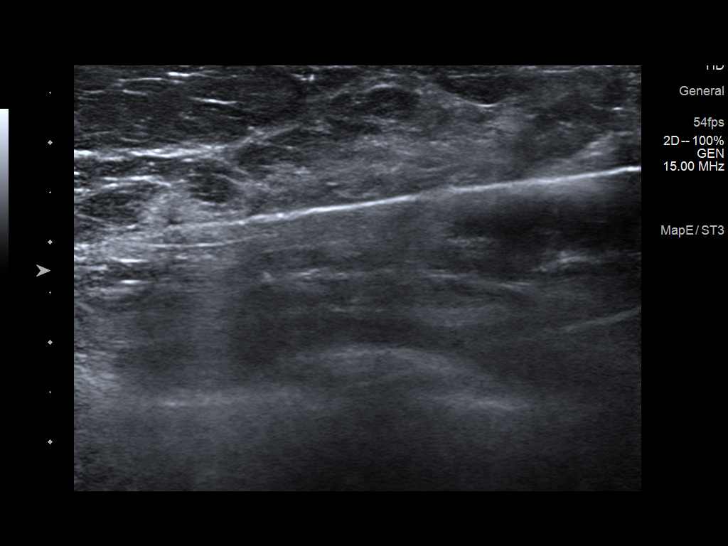
[im 10/13]
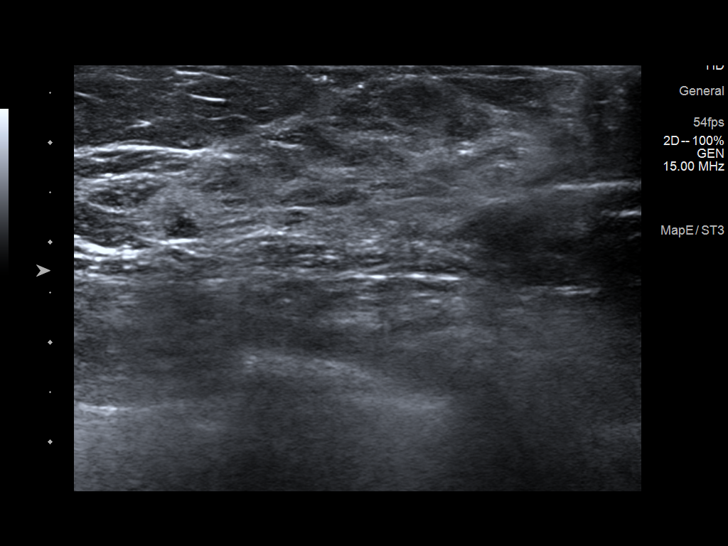
[im 11/13]
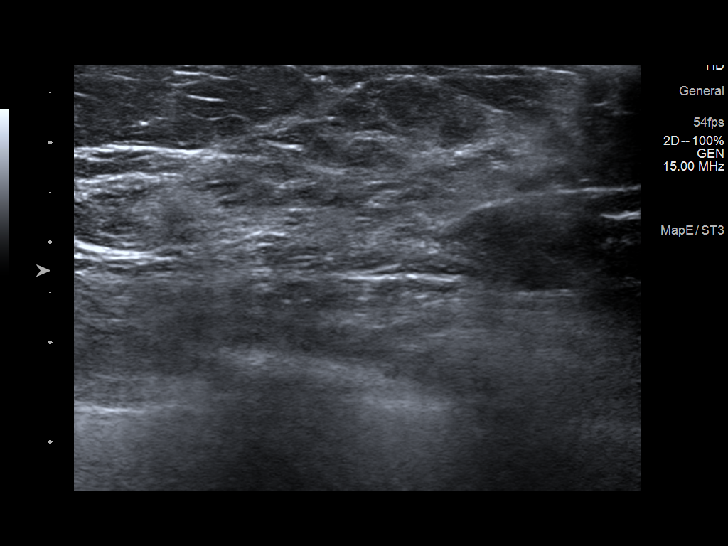
[im 12/13]
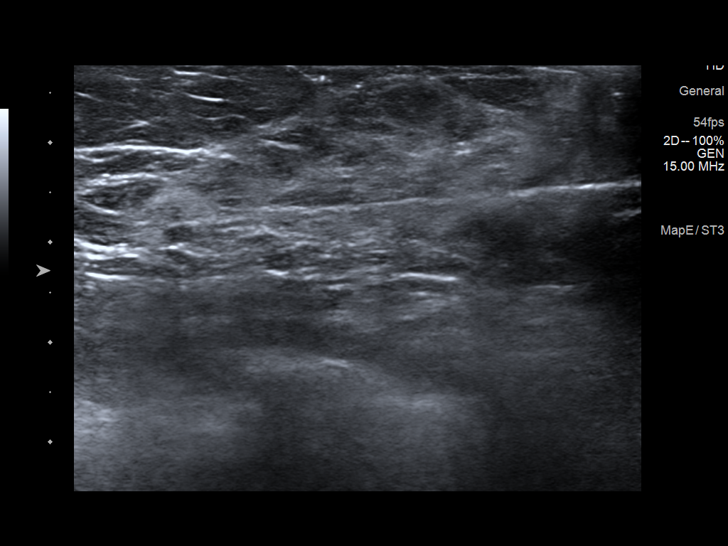
[im 13/13]
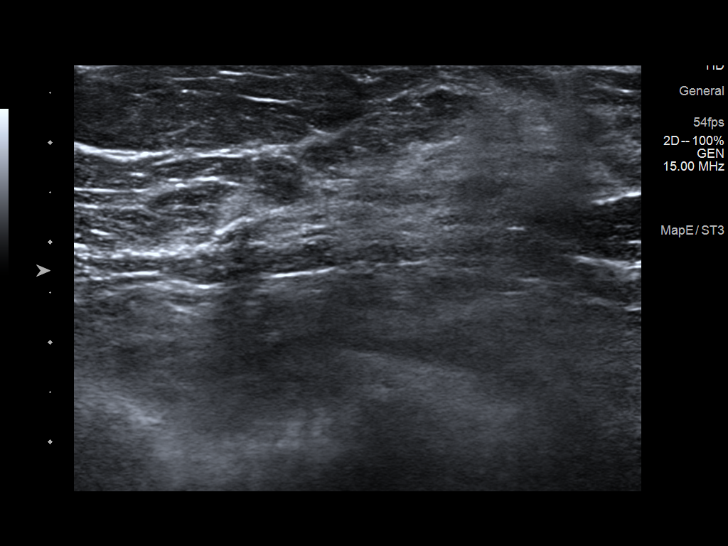

[13 of 13 positions shown; findings below may reference images not displayed]



Lesion quadrant: 3 o'clock

Using sterile technique and 1% Lidocaine as local anesthetic, under
direct ultrasound visualization, a 12 gauge Akuume device was
used to perform biopsy of the LEFT breast mass at the 3 o'clock axis
using a lateral approach. At the conclusion of the procedure ribbon
shaped tissue marker clip was deployed into the biopsy cavity.
Follow up 2 view mammogram was performed and dictated separately.
IMPRESSION: Ultrasound guided biopsy of the LEFT breast mass at the 3 o'clock
axis. No apparent complications.

ADDENDUM:
Pathology revealed GRADE 3 INVASIVE POORLY DIFFERENTIATED DUCTAL
ADENOCARCINOMA of the LEFT breast, 3 o'clock (ribbon clip). This was
found to be concordant by Dr. Vycinas Teles.

Pathology results were discussed with the patient by telephone. The
patient reported doing well after the biopsy with tenderness at the
site. Post biopsy instructions and care were reviewed and questions
were answered. The patient was encouraged to call The [REDACTED]

The patient was referred to [REDACTED]
[REDACTED] at [REDACTED] on
July 31, 2021.

Pathology results reported by Edson Fernandes Grzybowski RN on 07/23/2021.



Lesion quadrant: 3 o'clock

Using sterile technique and 1% Lidocaine as local anesthetic, under
direct ultrasound visualization, a 12 gauge Akuume device was
used to perform biopsy of the LEFT breast mass at the 3 o'clock axis
using a lateral approach. At the conclusion of the procedure ribbon
shaped tissue marker clip was deployed into the biopsy cavity.
Follow up 2 view mammogram was performed and dictated separately.
IMPRESSION: Ultrasound guided biopsy of the LEFT breast mass at the 3 o'clock
axis. No apparent complications.

## 2023-09-25 IMAGING — MG MM BREAST LOCALIZATION CLIP
4 series · 4 of 12 positions shown · non-contrast
Comparison: Previous exam(s).

CLINICAL DATA: Status post ultrasound-guided biopsy for a LEFT
breast mass at the 3 o'clock axis.

EXAM:
3D DIAGNOSTIC LEFT MAMMOGRAM POST ULTRASOUND BIOPSY

[L CC synth-2D]
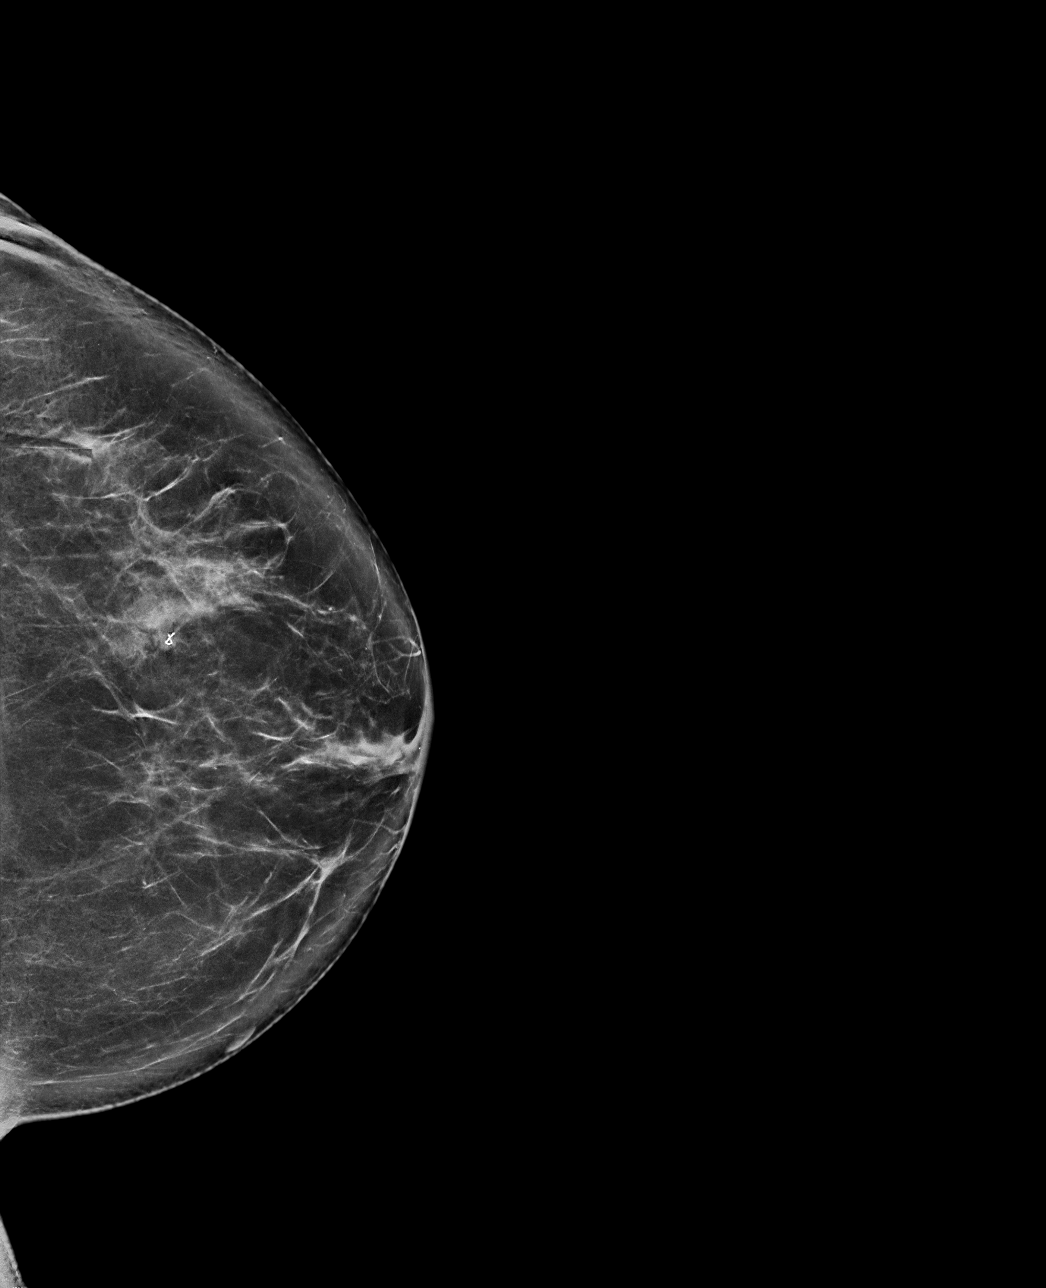

[L ML synth-2D]
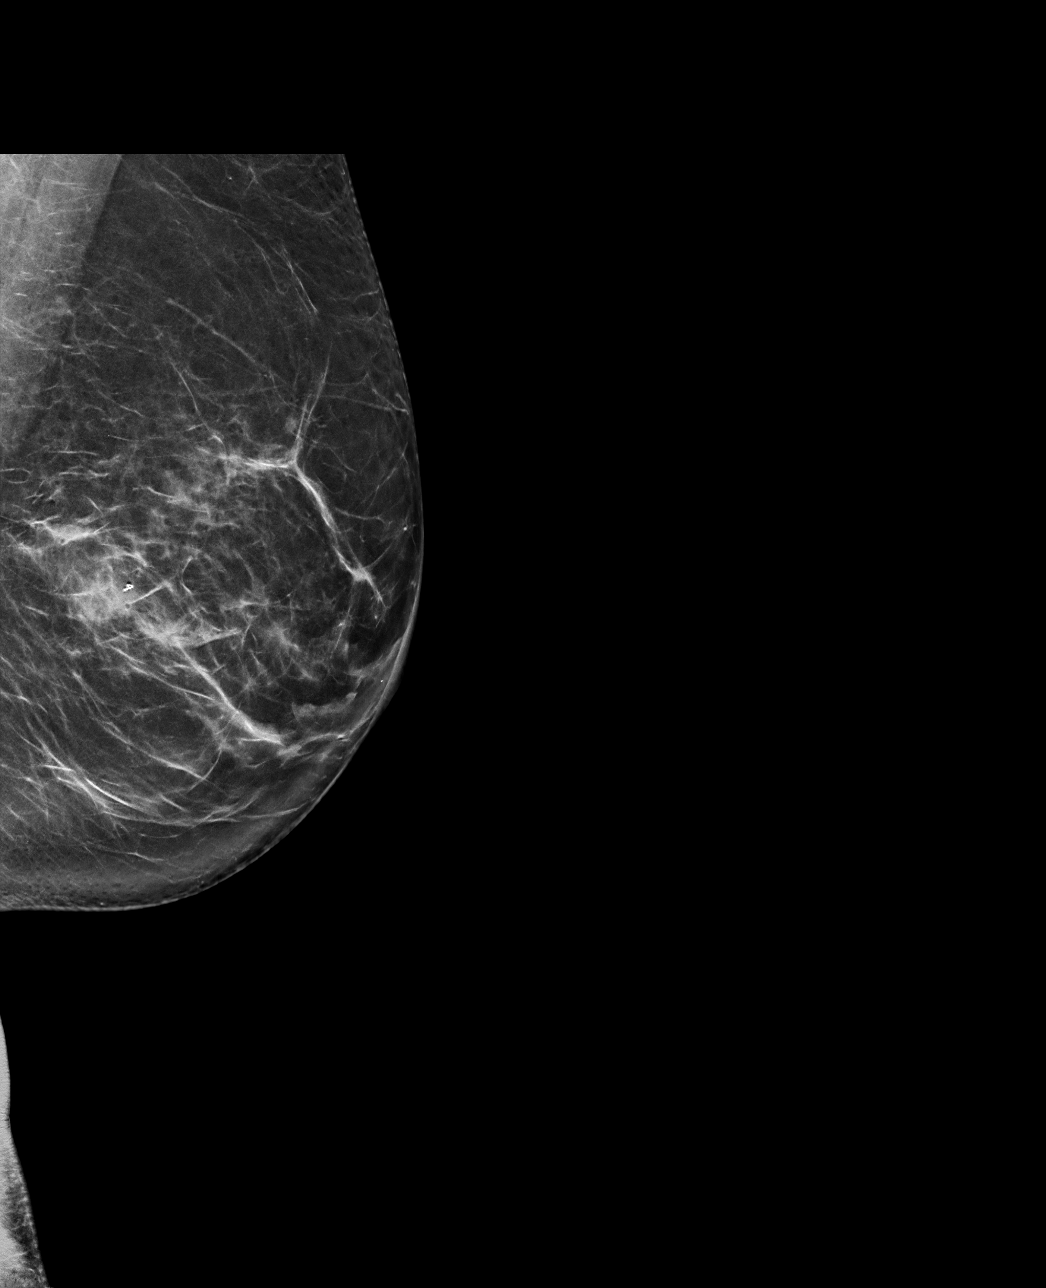

[L ML tomo · tomo slice 41/80.0]
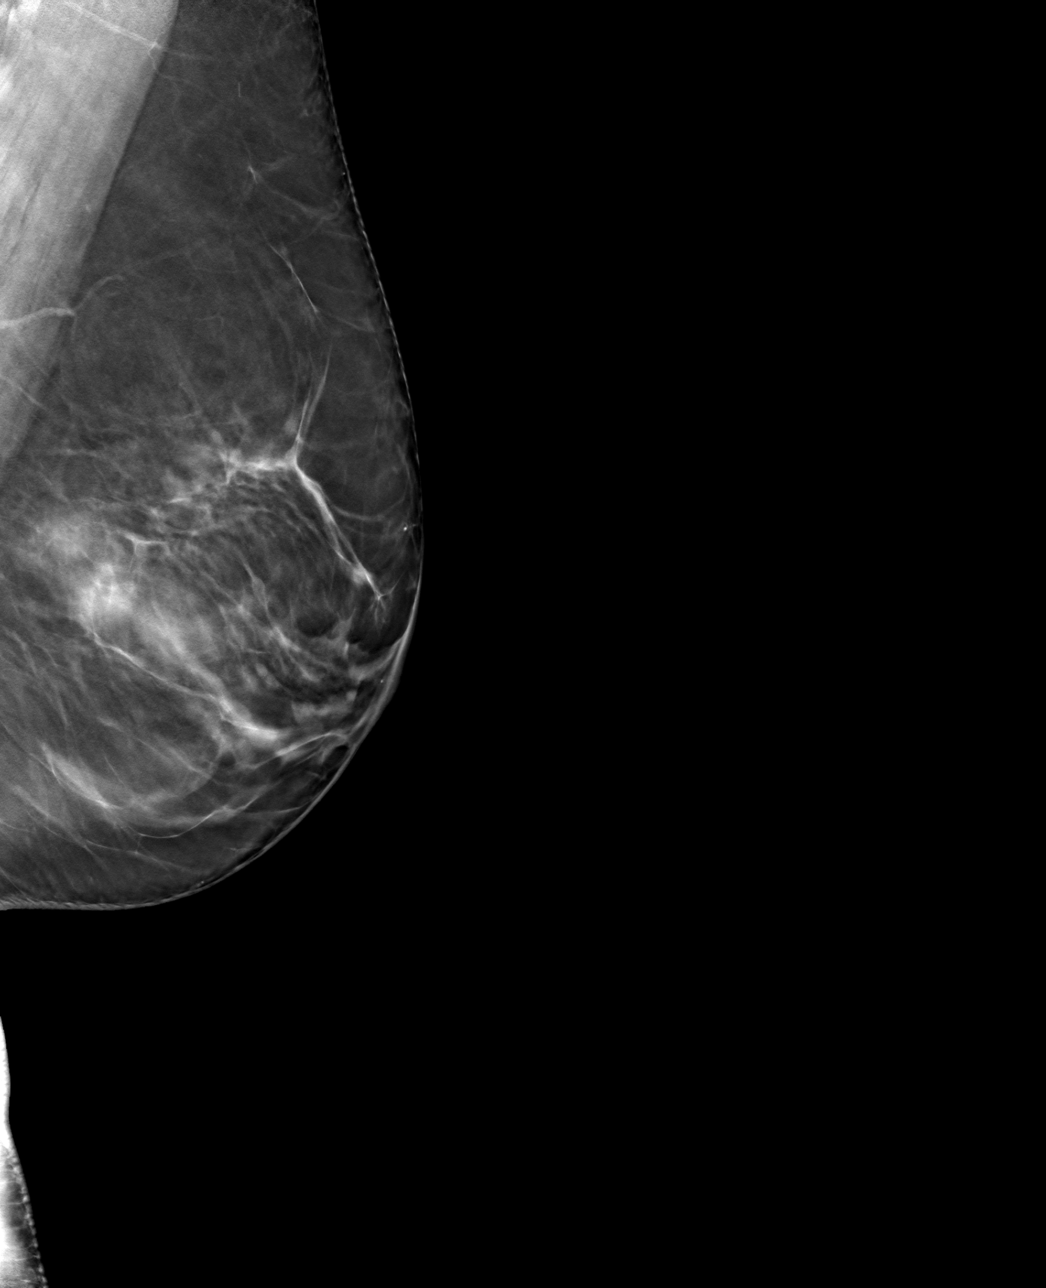

[L CC tomo · tomo slice 43/85.0]
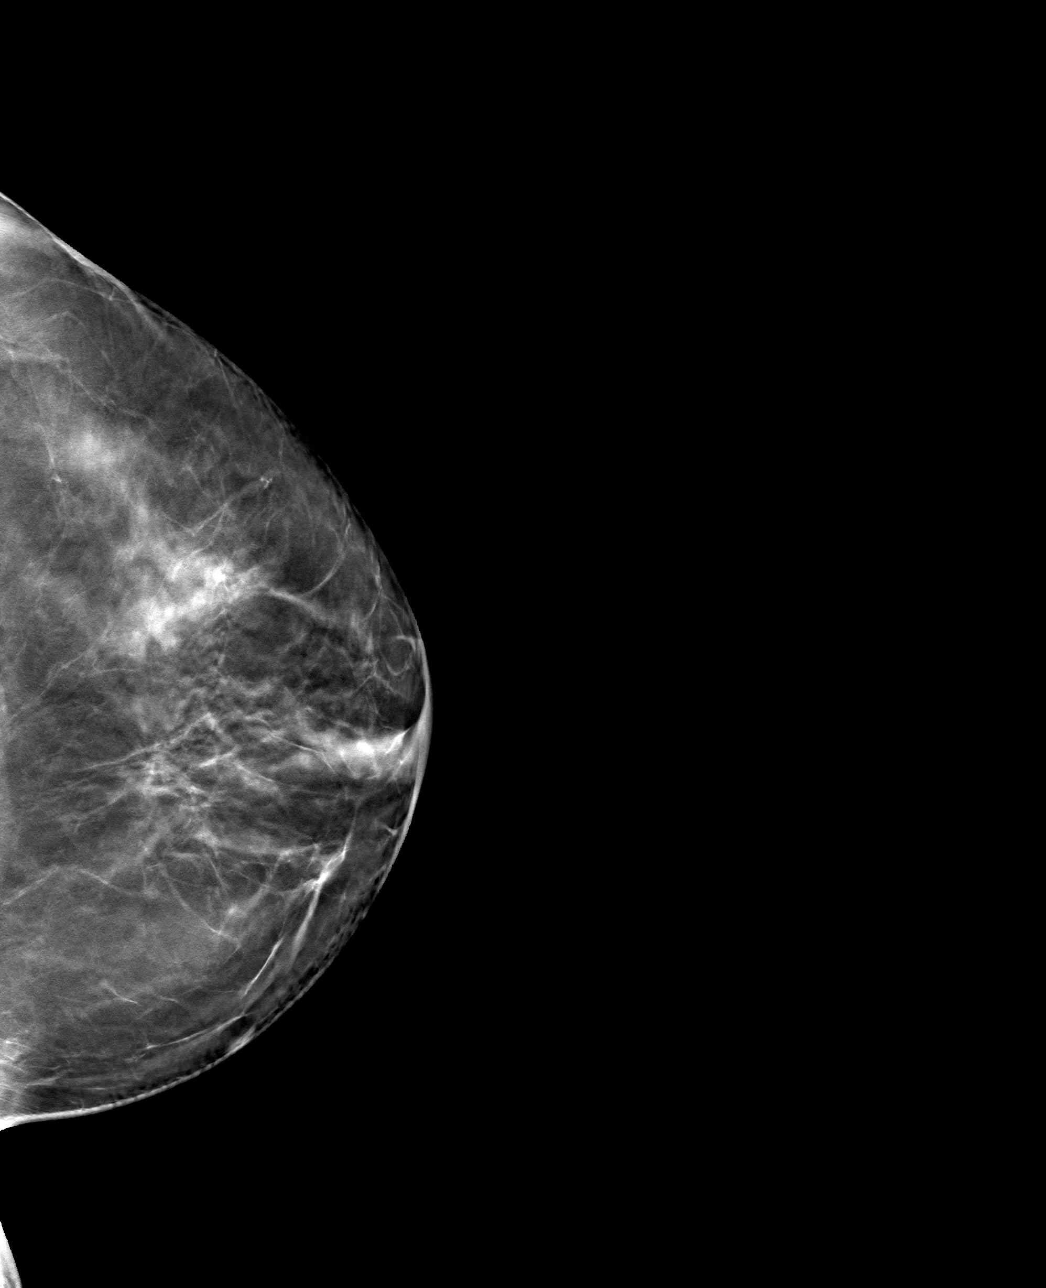

[4 of 12 positions shown; findings below may reference images not displayed]

FINDINGS: 3D Mammographic images were obtained following ultrasound guided
biopsy of the LEFT breast mass at the 3 o'clock axis. The biopsy
marking clip is in expected position at the site of biopsy.
IMPRESSION: Appropriate positioning of the ribbon shaped biopsy marking clip at
the site of biopsy in the outer LEFT breast corresponding to the
targeted mass at the 3 o'clock axis.

Final Assessment: Post Procedure Mammograms for Marker Placement

## 2023-11-21 ENCOUNTER — Other Ambulatory Visit (HOSPITAL_BASED_OUTPATIENT_CLINIC_OR_DEPARTMENT_OTHER): Payer: Self-pay | Admitting: Cardiology

## 2023-11-21 DIAGNOSIS — I10 Essential (primary) hypertension: Secondary | ICD-10-CM

## 2023-12-18 ENCOUNTER — Other Ambulatory Visit (HOSPITAL_BASED_OUTPATIENT_CLINIC_OR_DEPARTMENT_OTHER): Payer: Self-pay | Admitting: Cardiology

## 2023-12-18 DIAGNOSIS — I10 Essential (primary) hypertension: Secondary | ICD-10-CM

## 2024-01-13 ENCOUNTER — Other Ambulatory Visit (HOSPITAL_BASED_OUTPATIENT_CLINIC_OR_DEPARTMENT_OTHER): Payer: Self-pay | Admitting: Cardiology

## 2024-01-13 ENCOUNTER — Other Ambulatory Visit: Payer: Self-pay | Admitting: Hematology and Oncology

## 2024-01-13 DIAGNOSIS — I251 Atherosclerotic heart disease 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: Secondary | ICD-10-CM

## 2024-01-20 ENCOUNTER — Other Ambulatory Visit (HOSPITAL_BASED_OUTPATIENT_CLINIC_OR_DEPARTMENT_OTHER): Payer: Self-pay | Admitting: Cardiology

## 2024-01-20 DIAGNOSIS — I10 Essential (primary) hypertension: Secondary | ICD-10-CM

## 2024-02-24 ENCOUNTER — Inpatient Hospital Stay: Attending: Hematology and Oncology | Admitting: Hematology and Oncology

## 2024-02-24 VITALS — BP 135/55 | HR 70 | Temp 98.1°F | Resp 16 | Wt 138.9 lb

## 2024-02-24 DIAGNOSIS — Z170 Estrogen receptor positive status [ER+]: Secondary | ICD-10-CM

## 2024-02-24 DIAGNOSIS — C50412 Malignant neoplasm of upper-outer quadrant of left female breast: Secondary | ICD-10-CM | POA: Diagnosis not present

## 2024-02-24 NOTE — Progress Notes (Signed)
 " Cardiology Office Note:  .   Date:  02/25/2024  ID:  Christine [name], DOB Nov 11, 1960, MRN [id number] PCP: Dwight Trula SQUIBB, MD  [facility] Providers Cardiologist:  Shelda Bruckner, MD {  History of Present Illness: .   Christine [name] is a 64 y.o. female with a hx of hypertension, prediabetes, anemia, GERD, breast cancer s/p lumpectomy, osteopenia, who is seen for follow up today.  Cardiac history: calcium  score 09/2019 was 528. ETT was indeterminate. Coronary CT 11/2019 showed Cadrads 3 disease with concerning stenosis in proximal LAD. FFR showed that there was no significant stenosis in the proximal LAD, but there was a gradual decrease (delta not significant) in the mid LAD (0.96 prox to 0.86 after D1 and 0.78 after D2). Managed medically.  Today: Doing well overall. Discussed cholesterol, goals. Mild cramps in hands and feet. Tries to eat well, exercises. Does cardio 2 days/week and weights 2 days/week, pushes herself into moderate and sometimes high heart rate zone, no issues. A1c 6.0, discussed spectrum of disease, lifestyle.  ROS: Denies chest pain, shortness of breath at rest or with normal exertion. No PND, orthopnea, LE edema or unexpected weight gain. No syncope or palpitations. ROS otherwise negative except as noted.   Studies Reviewed: SABRA    EKG:  EKG Interpretation Date/Time:  Thursday February 25 2024 91:91:75 EST Ventricular Rate:  59 PR Interval:  152 QRS Duration:  82 QT Interval:  444 QTC Calculation: 439 R Axis:   55  Text Interpretation: Sinus bradycardia with sinus arrhythmia Confirmed by Bruckner Shelda [phone]) on 02/25/2024 8:23:07 AM    Physical Exam:   VS:  BP 102/72   Pulse 62   Ht 5' 4 (1.626 m)   Wt 138 lb 13.9 oz (63 kg)   SpO2 99%   BMI 23.84 kg/m    Wt Readings from Last 3 Encounters:  02/25/24 138 lb 13.9 oz (63 kg)  02/24/24 138 lb 14.4 oz (63 kg)  08/31/23 137 lb (62.1 kg)    GEN: Well nourished, well developed in no  acute distress HEENT: Normal, moist mucous membranes NECK: No JVD CARDIAC: regular rhythm, normal S1 and S2, no rubs or gallops. No murmur. VASCULAR: Radial and DP pulses 2+ bilaterally. No carotid bruits RESPIRATORY:  Clear to auscultation without rales, wheezing or rhonchi  ABDOMEN: Soft, non-tender, non-distended MUSCULOSKELETAL:  Ambulates independently SKIN: Warm and dry, no edema NEUROLOGIC:  Alert and oriented x 3. No focal neuro deficits noted. PSYCHIATRIC:  Normal affect    ASSESSMENT AND PLAN: .    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 -ETT with indeterminate ST changes, predominantly upsloping but abnormal test -echo normal -no chest pain. We discussed indications for cath -counseled on red flag warning signs that need immediate medical attention -continue aspirin, statin -has SL nitroglycerin , instructed on use   Hypercholesterolemia: -continue rosuvastatin  40 mg daily, refilled today -lipids from [place] reviewed, 12/10/23 Tchol 83, HDL 39, LDL 27, TG 81. -lpa 96.2 -we discussed LDL goals, as low as we can go is best, she is tolerating so will continue current statin   Hypertension: -continue valsartan -hydrochlorothiazide  80-12.5 mg daily, refilled today  CV risk counseling and prevention -recommend heart healthy/Mediterranean diet, with whole grains, fruits, vegetable, fish, lean meats, nuts, and olive oil. Limit salt. -recommend moderate walking, 3-5 times/week for 30-50 minutes each session. Aim for at least 150 minutes.week. Goal should be  pace of 3 miles/hours, or walking 1.5 miles in 30 minutes -recommend avoidance of tobacco products. Avoid excess alcohol.  Dispo: 12 mos  Signed, Shelda Bruckner, MD   Shelda Bruckner, MD, PhD, [facility]  [facility]  [place]  Heart & Vascular at [facility] at [facility] [address], [address] [place], [place] [address] [phone]   "

## 2024-02-24 NOTE — Progress Notes (Signed)
 [facility] Cancer Follow up:    Christine Trula SQUIBB, MD [address] [place] [address]   DIAGNOSIS:  Cancer Staging  Malignant neoplasm of upper-outer quadrant of left breast in female, estrogen receptor positive (HCC) Staging form: Breast, AJCC 8th Edition - Clinical stage from 07/29/2021: Stage IA (cT1c, cN0, cM0, G3, ER+, PR-, HER2+) - Signed by Lanell Donald Stagger, PA-C on 07/31/2021 Stage prefix: Initial diagnosis Method of lymph node assessment: Clinical Histologic grading system: 3 grade system   SUMMARY OF ONCOLOGIC HISTORY: Oncology History  Malignant neoplasm of upper-outer quadrant of left breast in female, estrogen receptor positive (HCC)  07/17/2021 Mammogram   Suspicious left breast mass at 3 o'clock, 4 cm from the nipple. No axillary adenopathy. Targeted ultrasound is performed, showing an irregular centrally hypoechoic mass with an echogenic rim measuring 7 x 5 by 7 mm, thought to correlate with the mammographically identified mass. This mass is located at 3 o'clock, 4 cm from the nipple. No axillary adenopathy.   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female, estrogen receptor positive (HCC)   07/29/2021 Cancer Staging   Staging form: Breast, AJCC 8th Edition - Clinical stage from 07/29/2021: Stage IA (cT1c, cN0, cM0, G3, ER+, PR-, HER2+) - Signed by Lanell Donald Stagger, PA-C on 07/31/2021 Stage prefix: Initial diagnosis Method of lymph node assessment: Clinical Histologic grading system: 3 grade system    Pathology Results   Path showed IDC, grade 3, ER 100% positive, strong staining intensity, PR negative, ki 67 35%, Her 2 positive   08/05/2021 Genetic Testing   Negative hereditary cancer genetic testing: no pathogenic variants detected in [facility] BRCAPlus Panel and [employer] CustomNext-Cancer +RNAinsight Panel.  Report dates are August 05, 2021 and August 08, 2021.    The BRCAplus panel  offered by [employer] and includes sequencing and deletion/duplication analysis for the following 8 genes: ATM, BRCA1, BRCA2, CDH1, CHEK2, PALB2, PTEN, and TP53. The CustomNext-Cancer+RNAinsight panel offered by [employer] includes sequencing and rearrangement analysis for the following 47 genes:  APC, ATM, AXIN2, BARD1, BMPR1A, BRCA1, BRCA2, BRIP1, CDH1, CDK4, CDKN2A, CHEK2, DICER1, EPCAM, GREM1, HOXB13, MEN1, MLH1, MSH2, MSH3, MSH6, MUTYH, NBN, NF1, NF2, NTHL1, PALB2, PMS2, POLD1, POLE, PTEN, RAD51C, RAD51D, RECQL, RET, SDHA, SDHAF2, SDHB, SDHC, SDHD, SMAD4, SMARCA4, STK11, TP53, TSC1, TSC2, and VHL.  RNA data is routinely analyzed for use in variant interpretation for all genes.   08/09/2021 Surgery   Left lumpectomy: IDC, grade 3, 3 sentinel lymph nodes all negative for cancer.  He has biomarker testing ER positive, PR negative, HER2 positive.  pT1c, PN0.   09/12/2021 -  Adjuvant Chemotherapy   Taxol /Herceptin  weekly x12, followed by Herceptin  given every 3 weeks to complete 1 year of treatment.     CURRENT THERAPY: Herceptin ; Tamoxifen  daily  INTERVAL HISTORY:   Discussed the use of AI scribe software for clinical note transcription with the patient, who gave verbal consent to proceed.  History of Present Illness Christine [name] is a 64 year old female with estrogen receptor-positive and Her 2 positive breast cancer status post surgery, currently on adjuvant tamoxifen  therapy, who presents for routine oncology surveillance.  She continues adjuvant tamoxifen  therapy and tolerates the medication well, without new or concerning adverse effects. She denies dysphagia, dyspnea, falls, hematochezia, melena, or hematuria. She maintains regular exercise and reports normal bowel habits.  She had guardant reveal in July, I dont have a report scanned but pt says  she received something in the mail that everything was ok.  No new symptoms or concerns were reported during the visit.   Rest of  the pertinent 10 point ROS reviewed and neg  Patient Active Problem List   Diagnosis Date Noted   Antineoplastic chemotherapy induced anemia 10/18/2021   Port-A-Cath in place 09/12/2021   Iron deficiency anemia 09/02/2021   Genetic testing 08/05/2021   Family history of breast cancer 07/31/2021   Atherosclerotic heart disease 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 07/31/2021   Easy bruising 07/31/2021   Essential hypertension 07/31/2021   Hearing loss in left ear 07/31/2021   Insomnia 07/31/2021   Mixed hyperlipidemia 07/31/2021   Other long term (current) drug therapy 07/31/2021   Sleep disorder 07/31/2021   Tendency toward bleeding easily 07/31/2021   Vitamin D deficiency 07/31/2021   Malignant neoplasm of upper-outer quadrant of left breast in female, estrogen receptor positive (HCC) 07/25/2021   Abnormal finding on mammography 07/10/2021   GERD (gastroesophageal reflux disease) 08/31/2019   Globus pharyngeus 08/02/2019   Rhinitis, chronic 08/02/2019   Elevated blood pressure reading 01/26/2019   Acute pain of right knee 10/26/2017    is allergic to lisinopril and mefloquine.  MEDICAL HISTORY: Past Medical History:  Diagnosis Date   Coronary artery disease    Family history of breast cancer 07/31/2021   GERD (gastroesophageal reflux disease)    Hypertension    Personal history of chemotherapy    Personal history of radiation therapy 01/08/2022   ended 02-06-22    SURGICAL HISTORY: Past Surgical History:  Procedure Laterality Date   BREAST LUMPECTOMY Left 08/09/2021   BREAST LUMPECTOMY WITH RADIOACTIVE SEED AND SENTINEL LYMPH NODE BIOPSY Left 08/09/2021   Procedure: LEFT BREAST RADIOACTIVE SEED LOCALIZED LUMPECTOMY AND SENTINEL NODE BIOPSY;  Surgeon: Curvin Deward MOULD, MD;  Location: [facility] OR;  Service: General;  Laterality: Left;  GEN & PEC BLOCK   CESAREAN SECTION     FOOT SURGERY Right    PORT-A-CATH REMOVAL Right 09/19/2022   Procedure: REMOVAL PORT-A-CATH;   Surgeon: Curvin Deward MOULD, MD;  Location: [facility];  Service: General;  Laterality: Right;  45 MINS NEEDED IN ROOM 8   PORTACATH PLACEMENT Right 08/09/2021   Procedure: PORT PLACEMENT RIGHT  INTERNAL JUGULAR  WITH ULTRASOUND GUIDANCE;  Surgeon: Curvin Deward MOULD, MD;  Location: [facility] OR;  Service: General;  Laterality: Right;  RIGHT INTERNAL JUGULAR PLACEMENT    SOCIAL HISTORY: Social History   Socioeconomic History   Marital status: Married    Spouse name: Not on file   Number of children: Not on file   Years of education: Not on file   Highest education level: Not on file  Occupational History   Not on file  Tobacco Use   Smoking status: Never   Smokeless tobacco: Never  Vaping Use   Vaping status: Never Used  Substance and Sexual Activity   Alcohol use: Yes    Alcohol/week: 2.0 standard drinks of alcohol    Types: 2 Glasses of wine per week   Drug use: Never   Sexual activity: Not on file  Other Topics Concern   Not on file  Social History Narrative   Not on file   Social Drivers of Health   Tobacco Use: Low Risk (09/04/2023)   Patient History    Smoking Tobacco Use: Never    Smokeless Tobacco Use: Never    Passive Exposure: Not on file  Financial Resource Strain: Not on file  Food Insecurity: Not on file  Transportation Needs: Not on file  Physical Activity: Not on file  Stress: Not on file  Social Connections: Not on file  Intimate Partner Violence: Not on file  Depression (PHQ2-9): Not on file  Alcohol Screen: Not on file  Housing: Not on file  Utilities: Not on file  Health Literacy: Not on file    FAMILY HISTORY: Family History  Problem Relation Age of Onset   Heart disease Father    Breast cancer Sister 66   Esophageal cancer Maternal Uncle        dx after age 39   Lung cancer Paternal Uncle    Skin cancer Paternal Uncle     Review of Systems  Constitutional:  Negative for appetite change, chills, fatigue, fever and unexpected weight  change.  HENT:   Negative for hearing loss, lump/mass and trouble swallowing.   Eyes:  Negative for eye problems and icterus.  Respiratory:  Negative for chest tightness, cough and shortness of breath.   Cardiovascular:  Negative for chest pain, leg swelling and palpitations.  Gastrointestinal:  Negative for abdominal distention, abdominal pain, constipation, diarrhea, nausea and vomiting.  Endocrine: Negative for hot flashes.  Genitourinary:  Negative for difficulty urinating.   Musculoskeletal:  Negative for arthralgias.  Skin:  Negative for itching and rash.  Neurological:  Negative for dizziness, extremity weakness, headaches and numbness.  Hematological:  Negative for adenopathy. Does not bruise/bleed easily.  Psychiatric/Behavioral:  Negative for depression. The patient is not nervous/anxious.       PHYSICAL EXAMINATION  ECOG PERFORMANCE STATUS: 1 - Symptomatic but completely ambulatory  Vitals:   02/24/24 1332  BP: (!) 135/55  Pulse: 70  Resp: 16  Temp: 98.1 F (36.7 C)  SpO2: 100%   General : Alert, oriented and in no acute distess Both breasts inspected, no palpable masses or regional adenopathy CTA bilaterally RRR No LE edema.    LABORATORY DATA:  CBC    Component Value Date/Time   WBC 8.9 08/25/2023 1227   RBC 4.20 08/25/2023 1227   HGB 12.7 08/25/2023 1227   HGB 11.6 (L) 08/21/2022 0833   HCT 38.0 08/25/2023 1227   PLT 231 08/25/2023 1227   PLT 227 08/21/2022 0833   MCV 90.5 08/25/2023 1227   MCH 30.2 08/25/2023 1227   MCHC 33.4 08/25/2023 1227   RDW 12.4 08/25/2023 1227   LYMPHSABS 1.4 08/25/2023 1227   MONOABS 0.4 08/25/2023 1227   EOSABS 0.0 08/25/2023 1227   BASOSABS 0.0 08/25/2023 1227    CMP     Component Value Date/Time   NA 139 08/25/2023 1227   NA 142 10/31/2019 1545   K 3.8 08/25/2023 1227   CL 104 08/25/2023 1227   CO2 30 08/25/2023 1227   GLUCOSE 106 (H) 08/25/2023 1227   BUN 23 08/25/2023 1227   BUN 11 10/31/2019 1545    CREATININE 0.70 08/25/2023 1227   CALCIUM  8.9 08/25/2023 1227   PROT 7.1 08/25/2023 1227   PROT 6.7 08/01/2020 1004   ALBUMIN 4.3 08/25/2023 1227   ALBUMIN 4.5 08/01/2020 1004   AST 21 08/25/2023 1227   ALT 18 08/25/2023 1227   ALKPHOS 31 (L) 08/25/2023 1227   BILITOT 0.7 08/25/2023 1227   GFRNONAA >60 08/25/2023 1227   GFRAA 104 10/31/2019 1545      ASSESSMENT and THERAPY PLAN:   Assessment and Plan Assessment & Plan Estrogen receptor positive breast cancer, status post surgery, on adjuvant endocrine therapy Asymptomatic, tolerating tamoxifen  well,  no concern for recurrence on surveillance. - Continue tamoxifen  therapy. - Mammogram annually - MRD testing every 6 months. - Next mammogram due June. Ordered - Scheduled follow-up in six months with laboratory studies.   All questions were answered. The patient knows to call the clinic with any problems, questions or concerns. We can certainly see the patient much sooner if necessary. This note was electronically signed.  Total encounter time:20 minutes*in face-to-face visit time, chart review, lab review, care coordination, order entry, and documentation of the encounter time.  *Total Encounter Time as defined by the Centers for Medicare and Medicaid Services includes, in addition to the face-to-face time of a patient visit (documented in the note above) non-face-to-face time: obtaining and reviewing outside history, ordering and reviewing medications, tests or procedures, care coordination (communications with other health care professionals or caregivers) and documentation in the medical record.

## 2024-02-25 ENCOUNTER — Other Ambulatory Visit: Payer: Self-pay

## 2024-02-25 ENCOUNTER — Ambulatory Visit (HOSPITAL_BASED_OUTPATIENT_CLINIC_OR_DEPARTMENT_OTHER): Admitting: Cardiology

## 2024-02-25 ENCOUNTER — Encounter: Payer: Self-pay | Admitting: Hematology and Oncology

## 2024-02-25 ENCOUNTER — Encounter (HOSPITAL_BASED_OUTPATIENT_CLINIC_OR_DEPARTMENT_OTHER): Payer: Self-pay | Admitting: Cardiology

## 2024-02-25 VITALS — BP 102/72 | HR 62 | Ht 64.0 in | Wt 138.9 lb

## 2024-02-25 DIAGNOSIS — I10 Essential (primary) hypertension: Secondary | ICD-10-CM | POA: Diagnosis not present

## 2024-02-25 DIAGNOSIS — Z7189 Other specified counseling: Secondary | ICD-10-CM | POA: Diagnosis not present

## 2024-02-25 DIAGNOSIS — I251 Atherosclerotic heart disease 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: Secondary | ICD-10-CM | POA: Diagnosis not present

## 2024-02-25 DIAGNOSIS — E7800 Pure hypercholesterolemia, unspecified: Secondary | ICD-10-CM | POA: Diagnosis not present

## 2024-02-25 MED ORDER — ROSUVASTATIN CALCIUM 40 MG PO TABS: 40.0000 mg | ORAL_TABLET | Freq: Every day | ORAL | 3 refills | Status: AC

## 2024-02-25 MED ORDER — VALSARTAN-HYDROCHLOROTHIAZIDE 80-12.5 MG PO TABS: 1.0000 | ORAL_TABLET | Freq: Every day | ORAL | 3 refills | Status: AC

## 2024-02-25 NOTE — Patient Instructions (Signed)
 Medication Instructions:  No changes *If you need a refill on your cardiac medications before your next appointment, please call your pharmacy*  Lab Work: None  Testing/Procedures: none  Follow-Up: At [facility], you and your health needs are our priority.  As part of our continuing mission to provide you with exceptional heart care, our providers are all part of one team.  This team includes your primary Cardiologist (physician) and Advanced Practice Providers or APPs (Physician Assistants and Nurse Practitioners) who all work together to provide you with the care you need, when you need it.  Your next appointment:   12 month(s)  Provider:   Shelda Bruckner, MD, Rosaline Bane, NP, or Reche Finder, NP

## 2024-02-29 ENCOUNTER — Ambulatory Visit

## 2024-03-03 ENCOUNTER — Ambulatory Visit: Attending: General Surgery

## 2024-03-03 VITALS — Wt 136.0 lb

## 2024-03-03 DIAGNOSIS — Z483 Aftercare following surgery for neoplasm: Secondary | ICD-10-CM | POA: Insufficient documentation

## 2024-03-03 NOTE — Therapy (Signed)
 " OUTPATIENT PHYSICAL THERAPY [facility] SCREENING NOTE   Patient Name: Christine [name] MRN: [id number] DOB:Dec 26, 1960, 64 y.o., female Today's Date: 03/03/2024  PCP: Dwight Trula SQUIBB, MD REFERRING PROVIDER: Curvin Deward MOULD, MD   PT End of Session - 03/03/24 1105     Visit Number 3   # unchanged due to screen only   PT Start Time 1058    PT Stop Time 1103    PT Time Calculation (min) 5 min    Activity Tolerance Patient tolerated treatment well    Behavior During Therapy [facility] for tasks assessed/performed          Past Medical History:  Diagnosis Date   Coronary artery disease    Family history of breast cancer 07/31/2021   GERD (gastroesophageal reflux disease)    Hypertension    Personal history of chemotherapy    Personal history of radiation therapy 01/08/2022   ended 02-06-22   Past Surgical History:  Procedure Laterality Date   BREAST LUMPECTOMY Left 08/09/2021   BREAST LUMPECTOMY WITH RADIOACTIVE SEED AND SENTINEL LYMPH NODE BIOPSY Left 08/09/2021   Procedure: LEFT BREAST RADIOACTIVE SEED LOCALIZED LUMPECTOMY AND SENTINEL NODE BIOPSY;  Surgeon: Curvin Deward MOULD, MD;  Location: [facility] OR;  Service: General;  Laterality: Left;  GEN & PEC BLOCK   CESAREAN SECTION     FOOT SURGERY Right    PORT-A-CATH REMOVAL Right 09/19/2022   Procedure: REMOVAL PORT-A-CATH;  Surgeon: Curvin Deward MOULD, MD;  Location: [facility];  Service: General;  Laterality: Right;  45 MINS NEEDED IN ROOM 8   PORTACATH PLACEMENT Right 08/09/2021   Procedure: PORT PLACEMENT RIGHT  INTERNAL JUGULAR  WITH ULTRASOUND GUIDANCE;  Surgeon: Curvin Deward MOULD, MD;  Location: [facility] OR;  Service: General;  Laterality: Right;  RIGHT INTERNAL JUGULAR PLACEMENT   Patient Active Problem List   Diagnosis Date Noted   Antineoplastic chemotherapy induced anemia 10/18/2021   Port-A-Cath in place 09/12/2021   Iron deficiency anemia 09/02/2021   Genetic testing 08/05/2021   Family history of breast cancer 07/31/2021    Atherosclerotic heart disease 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 07/31/2021   Easy bruising 07/31/2021   Essential hypertension 07/31/2021   Hearing loss in left ear 07/31/2021   Insomnia 07/31/2021   Mixed hyperlipidemia 07/31/2021   Other long term (current) drug therapy 07/31/2021   Sleep disorder 07/31/2021   Tendency toward bleeding easily 07/31/2021   Vitamin D deficiency 07/31/2021   Malignant neoplasm of upper-outer quadrant of left breast in female, estrogen receptor positive (HCC) 07/25/2021   Abnormal finding on mammography 07/10/2021   GERD (gastroesophageal reflux disease) 08/31/2019   Globus pharyngeus 08/02/2019   Rhinitis, chronic 08/02/2019   Elevated blood pressure reading 01/26/2019   Acute pain of right knee 10/26/2017    REFERRING DIAG: left breast cancer at risk for lymphedema  THERAPY DIAG: Aftercare following surgery for neoplasm  PERTINENT HISTORY: Patient was diagnosed on 07/08/2021 with left grade 3 invasive ductal carcinoma breast cancer. It measures 7 mm and is located in the upper outer quadrant. It is ER positive, PR negative, and HER2 positive with a Ki67 of 35%. Left lumpectomy and SLNB on 08/09/21 with port placement and 3 negative lymph nodes. Underwent chemotherapy with taxol  and herceptin .   PRECAUTIONS: left UE Lymphedema risk  SUBJECTIVE: Patient returns for her 6 month L-Dex screen.   PAIN:  Are you having pain? No  LYMPHEDEMA ASSESSMENTS:    Patient was assessed today using the SOZO machine  to determine the lymphedema index score. This was compared to her baseline score. It was determined that she is within the recommended range when compared to her baseline and no further action is needed at this time. She will continue SOZO screenings. These are done every 3 months for 2 years post operatively followed by every 6 months for 2 years, and then annually.        L-DEX FLOWSHEETS - 03/03/24 1100       L-DEX LYMPHEDEMA  SCREENING   Measurement Type Unilateral    L-DEX MEASUREMENT EXTREMITY Upper Extremity    POSITION  Standing    DOMINANT SIDE Right    At Risk Side Left    BASELINE SCORE (UNILATERAL) -2.1    L-DEX SCORE (UNILATERAL) 2.1    VALUE CHANGE (UNILAT) 4.2           P: Pt to return in 3 months due to increase that pt would like checked again in 3 months instead of 6, if not continuing to elevate then will resume 6 month L-Dex screens until  08/2025.  Berwyn Knights, PTA 03/03/24 11:06 AM "

## 2024-05-30 ENCOUNTER — Ambulatory Visit: Attending: General Surgery

## 2024-08-23 ENCOUNTER — Inpatient Hospital Stay

## 2024-08-23 ENCOUNTER — Inpatient Hospital Stay: Admitting: Hematology and Oncology
